# Patient Record
Sex: Female | Born: 1990 | Race: White | Hispanic: No | Smoking: Never smoker
Health system: Southern US, Community
[De-identification: ages and names within clinical notes are randomized; demographics above are authoritative.]

## PROBLEM LIST (undated history)

## (undated) DIAGNOSIS — H9325 Central auditory processing disorder: Secondary | ICD-10-CM

## (undated) DIAGNOSIS — R569 Unspecified convulsions: Secondary | ICD-10-CM

## (undated) DIAGNOSIS — K219 Gastro-esophageal reflux disease without esophagitis: Secondary | ICD-10-CM

## (undated) DIAGNOSIS — R112 Nausea with vomiting, unspecified: Secondary | ICD-10-CM

## (undated) DIAGNOSIS — F840 Autistic disorder: Secondary | ICD-10-CM

## (undated) DIAGNOSIS — J45909 Unspecified asthma, uncomplicated: Secondary | ICD-10-CM

## (undated) DIAGNOSIS — R7303 Prediabetes: Secondary | ICD-10-CM

## (undated) DIAGNOSIS — F909 Attention-deficit hyperactivity disorder, unspecified type: Secondary | ICD-10-CM

## (undated) DIAGNOSIS — T1490XA Injury, unspecified, initial encounter: Secondary | ICD-10-CM

## (undated) DIAGNOSIS — Z30017 Encounter for initial prescription of implantable subdermal contraceptive: Secondary | ICD-10-CM

## (undated) DIAGNOSIS — F4010 Social phobia, unspecified: Secondary | ICD-10-CM

## (undated) DIAGNOSIS — F319 Bipolar disorder, unspecified: Secondary | ICD-10-CM

## (undated) DIAGNOSIS — Z9889 Other specified postprocedural states: Secondary | ICD-10-CM

## (undated) HISTORY — DX: Bipolar disorder, unspecified: F31.9

## (undated) HISTORY — DX: Unspecified asthma, uncomplicated: J45.909

## (undated) HISTORY — DX: Encounter for initial prescription of implantable subdermal contraceptive: Z30.017

## (undated) HISTORY — DX: Injury, unspecified, initial encounter: T14.90XA

## (undated) HISTORY — DX: Social phobia, unspecified: F40.10

## (undated) HISTORY — DX: Central auditory processing disorder: H93.25

## (undated) HISTORY — DX: Attention-deficit hyperactivity disorder, unspecified type: F90.9

## (undated) HISTORY — DX: Autistic disorder: F84.0

## (undated) HISTORY — PX: WISDOM TOOTH EXTRACTION: SHX21

---
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
## 2011-02-21 NOTE — H&P (Signed)
Shannon Lin                          ACCOUNT NO.:  [id number]   MEDICAL RECORD NO.:  [id number]                   PATIENT TYPE:  INP   LOCATION:  0104                                 FACILITY:  [facility]   PHYSICIAN:  Carolanne Grumbling, M.D.                 DATE OF BIRTH:  May 01, 1991   DATE OF ADMISSION:  12/01/2003  DATE OF DISCHARGE:                         PSYCHIATRIC ADMISSION ASSESSMENT   CHIEF COMPLAINT:  Shannon Lin is a 20 year old female.  Shannon Lin was admitted  from her local hospital after her mother took her there after reporting she  was having suicidal thoughts with threats for jumping out of a car.   HISTORY LEADING UP TO THE PRESENT ILLNESS:  Shannon Lin says she was not  suicidal at the time.  She does not know why her mother said those things.  She admits to being depressed for some time and having made suicidal threats  in the past.  She says even then she is not really going to kill herself.  She just gets mad.  Consequently she sees no reason to be here.   FAMILY, SCHOOL AND SOCIAL ISSUES:  Shannon Lin apparently has been unhappy for  some time.  She wants to live with her father in [place].  She she loves  her mother but they do not always get along.  Her story was hard to follow  at times.  She seems to not be a good historian and does not seem to put  things together in always a logical fashion and consequently it seems she  was playing down her issues.  She says she has a stepfather who recently  separated from her mother.  She has an 1 year old sister who sounds like a  step or a half.  She has 5 stepbrothers and one full sister who is 19 who  currently lives with her and her mother.  She said school goes okay.  She  gets teased by her peers.  They call her a ho by the way she dresses.  She  does not think she dresses in any kind of a way to make them say those  things.  Consequently she does not like school.  She says her grades are  usually okay, with A's, B's, and  C's.  She says she passes every year.  She  says she is 12 and in the 6th grade because she started late but has not  failed any grades, she says.  She denied any history of sexual abuse or  physical abuse.   PREVIOUS PSYCHIATRIC TREATMENT:  She sees Dr. Mitzi Hansen and she started seeing  a counselor recently.   ALCOHOL, DRUG AND LEGAL ISSUES:  She denied any use or abuse.   MEDICAL PROBLEMS, ALLERGIES, AND MEDICATIONS:  She has no known allergies.  She says she has a seizure disorder.  She currently takes Concerta, Topamax,  Abilify, Strattera and Zoloft.   MENTAL STATUS  EXAM:  At the time of the initial evaluation revealed an  alert, oriented girl,  who came to the interview willingly and was  cooperative.  She was appropriately dressed and groomed.  She admitted to  depression with making suicidal threats but said she would never act on the  threats and she denied any current suicidal ideation or intent.  There was  no evidence of any thought disorder or other psychosis.  Short term and long  term memory were intact as measured by her ability to recall recent and  remote events in her own life.  Judgment currently seemed to be impaired by  her vague presentation and not seeming to be able to put information  together in a way that is helpful.  She does have a history of auditory  processing problems.  Otherwise intelligence seems to be within normal  limits.  Concentration was adequate for a one to one interview.   PATIENT ASSETS:  Shannon Lin is cooperative and says she wants help.   ADMISSION DIAGNOSIS:   AXIS I:  1. Mood disorder not otherwise specified.  2. Attention deficit hyperactivity disorder.   AXIS II:  Central auditory processing disorder by history.   AXIS III:  Seizure disorder by history.   AXIS IV:  Moderate.   AXIS V:  40/55   INITIAL PLAN OF CARE:  Estimated length of hospitalization is 5 to 7 days.  The plan is to stabilize to the point where she has no  suicidal ideation and  has a plan for dealing with her stresses more effectively.  Dr. Marlyne Beards  will be the attending.                                               Carolanne Grumbling, M.D.    GT/MEDQ  D:  12/02/2003  T:  12/03/2003  Job:  [id number]
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
## 2011-07-07 LAB — POCT PREGNANCY, URINE: Preg Test, Ur: NEGATIVE

## 2011-07-22 LAB — CBC
HCT: 38.8
Platelets: 303
RDW: 12.3
WBC: 12.9 — ABNORMAL HIGH

## 2011-07-22 LAB — URINALYSIS, ROUTINE W REFLEX MICROSCOPIC
Glucose, UA: NEGATIVE
Ketones, ur: 40 — AB
Nitrite: NEGATIVE
Protein, ur: NEGATIVE
pH: 6

## 2011-07-22 LAB — COMPREHENSIVE METABOLIC PANEL
ALT: 12
AST: 21
Albumin: 3.5
Alkaline Phosphatase: 72
BUN: 13
Chloride: 101
Potassium: 4.3
Sodium: 139
Total Bilirubin: 0.7
Total Protein: 6.8

## 2011-07-22 LAB — DIFFERENTIAL
Basophils Absolute: 0
Basophils Relative: 0
Eosinophils Absolute: 0
Eosinophils Relative: 0
Monocytes Absolute: 0.4
Monocytes Relative: 3

## 2011-07-22 LAB — PREGNANCY, URINE: Preg Test, Ur: NEGATIVE

## 2013-02-02 ENCOUNTER — Encounter: Payer: Self-pay | Admitting: *Deleted

## 2013-02-03 ENCOUNTER — Encounter: Payer: Self-pay | Admitting: Nurse Practitioner

## 2013-02-03 ENCOUNTER — Ambulatory Visit (INDEPENDENT_AMBULATORY_CARE_PROVIDER_SITE_OTHER): Payer: Medicaid Other | Admitting: Nurse Practitioner

## 2013-02-03 DIAGNOSIS — Z13220 Encounter for screening for lipoid disorders: Secondary | ICD-10-CM

## 2013-02-03 DIAGNOSIS — R5383 Other fatigue: Secondary | ICD-10-CM

## 2013-02-03 DIAGNOSIS — R5381 Other malaise: Secondary | ICD-10-CM

## 2013-02-04 ENCOUNTER — Encounter: Payer: Self-pay | Admitting: Nurse Practitioner

## 2013-02-04 ENCOUNTER — Other Ambulatory Visit: Payer: Self-pay | Admitting: *Deleted

## 2013-02-04 DIAGNOSIS — R5381 Other malaise: Secondary | ICD-10-CM

## 2013-02-04 LAB — HEPATIC FUNCTION PANEL
ALT: 14 U/L (ref 0–35)
Bilirubin, Direct: 0.1 mg/dL (ref 0.0–0.3)
Indirect Bilirubin: 0.4 mg/dL (ref 0.0–0.9)
Total Bilirubin: 0.5 mg/dL (ref 0.3–1.2)

## 2013-02-04 LAB — LIPID PANEL
Cholesterol: 188 mg/dL (ref 0–200)
HDL: 48 mg/dL (ref >39)
Triglycerides: 151 mg/dL — ABNORMAL HIGH (ref <150)
VLDL: 30 mg/dL (ref 0–40)

## 2013-02-04 LAB — BASIC METABOLIC PANEL
BUN: 9 mg/dL (ref 6–23)
Calcium: 9.9 mg/dL (ref 8.4–10.5)
Chloride: 100 mEq/L (ref 96–112)
Creat: 0.54 mg/dL (ref 0.50–1.10)

## 2013-02-04 NOTE — Progress Notes (Signed)
Subjective:  Presents with her mother to discuss her weight. Obvious friction between patient and her mother. Mother states she tries to buy healthy foods but is not there with patient all the time. Feels her boyfriend wants her to be overweight as a control issue. Patient denies this. Very limited activity. Some fatigue especially since she has gained weight. Does not drink a lot of sodas. Does drink some Kool-Aid but with minimal sugar. Has been on several mental health medications that can influence weight gain. No chest pain or shortness of breath. Has large breasts, wears double D. bra. Has a strong family history of heart disease at a young age.  Objective:   BP 148/80  Pulse 70  Ht 5' 0.25" (1.53 m)  Wt 194 lb (87.998 kg)  BMI 37.59 kg/m2  LMP 12/04/2012 NAD. Alert, oriented. Lungs clear. Heart regular rate rhythm. Abdomen obese soft nondistended nontender. Central obesity noted, waist circumference greater than 35 inches. Has gained 6 pounds since June 2013.  Assessment:Morbid obesity - Plan: Basic metabolic panel, Hepatic function panel, TSH, Insulin, Fasting, Basic metabolic panel, Hepatic function panel, TSH, Insulin, Fasting  Fatigue - Plan: Basic metabolic panel, Hepatic function panel, TSH, Insulin, Fasting, Basic metabolic panel, Hepatic function panel, TSH, Insulin, Fasting  Need for lipid screening - Plan: Lipid panel, Lipid panel  Plan: Lab work ordered. Refer patient to dietitian. Strongly emphasized that the patient will have to make a personal effort to lose weight. No weight loss medication prescribed at this time since patient has not shown any initiative until now to lose weight. Also concerned about possible interaction with patient's current medications. Patient has been told that she will need to show personal effort for any medication will even be considered.

## 2013-02-04 NOTE — Assessment & Plan Note (Signed)
Plan: Lab work ordered. Refer patient to dietitian. Strongly emphasized that the patient will have to make a personal effort to lose weight. No weight loss medication prescribed at this time since patient has not shown any initiative until now to lose weight. Also concerned about possible interaction with patient's current medications. Patient has been told that she will need to show personal effort for any medication will even be considered.

## 2013-02-09 ENCOUNTER — Telehealth: Payer: Self-pay | Admitting: Family Medicine

## 2013-02-09 NOTE — Telephone Encounter (Signed)
Call to inquiry on results for labs completed last Friday Feb 04, 2013.  Please call patient when results are completed.

## 2013-02-10 ENCOUNTER — Encounter: Payer: Self-pay | Admitting: Nurse Practitioner

## 2013-04-22 NOTE — Progress Notes (Signed)
Test was not run

## 2013-04-22 NOTE — Progress Notes (Signed)
Test was not performed

## 2013-05-17 ENCOUNTER — Encounter: Payer: Medicaid Other | Admitting: Nurse Practitioner

## 2013-09-07 ENCOUNTER — Telehealth: Payer: Self-pay | Admitting: *Deleted

## 2013-09-07 NOTE — Telephone Encounter (Signed)
Please verify dose with the pharmacy. May have a 30 day prescription of the Abilify.

## 2013-09-07 NOTE — Telephone Encounter (Signed)
Mom is requesting a refill for Abilify 20 mg.. She normally get it from [facility] office Dr. Margarita Rana.. Her doctor is currently out of the country. Mom would like to know if she can get a prescription to last her over for a couple week until her doctor gets back   pharmacy: [facility]  Mom # [phone]

## 2013-09-08 MED ORDER — ARIPIPRAZOLE 15 MG PO TABS: 15.0000 mg | ORAL_TABLET | Freq: Every day | ORAL | Status: DC

## 2013-09-08 NOTE — Telephone Encounter (Signed)
I called [facility]. They stated pt was taking abilify 15mg  one every day. rx sent to [facility] [place] for a 30 day supply. [facility] to notify patient.

## 2013-09-08 NOTE — Telephone Encounter (Signed)
Discussed with patient. Med sent to pharm.  

## 2013-09-23 ENCOUNTER — Other Ambulatory Visit: Payer: Self-pay | Admitting: Family Medicine

## 2013-09-30 ENCOUNTER — Encounter: Payer: Self-pay | Admitting: Family Medicine

## 2013-09-30 ENCOUNTER — Ambulatory Visit (INDEPENDENT_AMBULATORY_CARE_PROVIDER_SITE_OTHER): Payer: Medicaid Other | Admitting: Family Medicine

## 2013-09-30 VITALS — BP 130/70 | Temp 98.6°F | Ht 61.0 in | Wt 193.1 lb

## 2013-09-30 DIAGNOSIS — B353 Tinea pedis: Secondary | ICD-10-CM

## 2013-09-30 DIAGNOSIS — R739 Hyperglycemia, unspecified: Secondary | ICD-10-CM

## 2013-09-30 DIAGNOSIS — Z131 Encounter for screening for diabetes mellitus: Secondary | ICD-10-CM

## 2013-09-30 DIAGNOSIS — J069 Acute upper respiratory infection, unspecified: Secondary | ICD-10-CM

## 2013-09-30 DIAGNOSIS — R638 Other symptoms and signs concerning food and fluid intake: Secondary | ICD-10-CM

## 2013-09-30 DIAGNOSIS — R7309 Other abnormal glucose: Secondary | ICD-10-CM

## 2013-09-30 DIAGNOSIS — R635 Abnormal weight gain: Secondary | ICD-10-CM

## 2013-09-30 LAB — POCT GLYCOSYLATED HEMOGLOBIN (HGB A1C): Hemoglobin A1C: 5.1

## 2013-09-30 MED ORDER — KETOCONAZOLE 2 % EX CREA: 1.0000 "application " | TOPICAL_CREAM | Freq: Two times a day (BID) | CUTANEOUS | Status: DC
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
## 2013-10-04 ENCOUNTER — Other Ambulatory Visit: Payer: Self-pay | Admitting: Family Medicine

## 2013-10-04 ENCOUNTER — Telehealth: Payer: Self-pay | Admitting: Family Medicine

## 2013-10-04 DIAGNOSIS — R21 Rash and other nonspecific skin eruption: Secondary | ICD-10-CM

## 2013-10-04 NOTE — Telephone Encounter (Signed)
For this situation I rec referral to Dermatology, I put it into system, Enid Derry will handle please inform familty

## 2013-10-04 NOTE — Telephone Encounter (Signed)
Patient was seen on 12/26 with spots on bottom of her foot now has spread into two spots. Now she wants a referral to doctor bradford or dermalogist as soon as possible.

## 2013-10-04 NOTE — Telephone Encounter (Addendum)
Family notified

## 2013-10-09 ENCOUNTER — Other Ambulatory Visit: Payer: Self-pay | Admitting: Nurse Practitioner

## 2013-10-10 ENCOUNTER — Telehealth: Payer: Self-pay | Admitting: Family Medicine

## 2013-10-10 MED ORDER — NORGESTIM-ETH ESTRAD TRIPHASIC 0.18/0.215/0.25 MG-25 MCG PO TABS: 1.0000 | ORAL_TABLET | Freq: Every day | ORAL | Status: DC

## 2013-10-10 NOTE — Telephone Encounter (Signed)
Medication sent in to pharmacy. Mom was notified.

## 2013-10-10 NOTE — Telephone Encounter (Signed)
Refill times 6 

## 2013-10-10 NOTE — Telephone Encounter (Signed)
Patient will be out of ortho tri-cyclen today and needs a refill ASAP    [facility]

## 2013-10-31 ENCOUNTER — Encounter: Payer: Self-pay | Admitting: Family Medicine

## 2013-11-26 ENCOUNTER — Other Ambulatory Visit: Payer: Self-pay | Admitting: Family Medicine

## 2014-01-07 ENCOUNTER — Other Ambulatory Visit: Payer: Self-pay | Admitting: Family Medicine

## 2014-05-07 ENCOUNTER — Other Ambulatory Visit: Payer: Self-pay | Admitting: Family Medicine

## 2014-05-19 ENCOUNTER — Encounter: Payer: Self-pay | Admitting: Nurse Practitioner

## 2014-05-19 ENCOUNTER — Ambulatory Visit (INDEPENDENT_AMBULATORY_CARE_PROVIDER_SITE_OTHER): Payer: Medicaid Other | Admitting: Nurse Practitioner

## 2014-05-19 VITALS — BP 128/70 | Ht 61.0 in | Wt 197.5 lb

## 2014-05-19 DIAGNOSIS — B353 Tinea pedis: Secondary | ICD-10-CM

## 2014-05-19 DIAGNOSIS — K921 Melena: Secondary | ICD-10-CM

## 2014-05-19 DIAGNOSIS — L301 Dyshidrosis [pompholyx]: Secondary | ICD-10-CM

## 2014-05-19 MED ORDER — CLOBETASOL PROPIONATE 0.05 % EX CREA: 1.0000 "application " | TOPICAL_CREAM | Freq: Two times a day (BID) | CUTANEOUS | Status: DC

## 2014-05-19 MED ORDER — KETOCONAZOLE 2 % EX CREA: 1.0000 "application " | TOPICAL_CREAM | Freq: Two times a day (BID) | CUTANEOUS | Status: DC

## 2014-05-19 NOTE — Patient Instructions (Signed)
Phentermine for weight loss.

## 2014-05-24 ENCOUNTER — Telehealth: Payer: Self-pay | Admitting: Family Medicine

## 2014-05-24 NOTE — Telephone Encounter (Signed)
Mom checked with pyschology, weight loss meds that were recommended will be fine to take with her other meds, please call in to [facility] unsure of name of medicine, pt saw [name] 05/19/14 Please call mom when done

## 2014-05-25 ENCOUNTER — Other Ambulatory Visit: Payer: Self-pay | Admitting: Nurse Practitioner

## 2014-05-25 ENCOUNTER — Encounter: Payer: Self-pay | Admitting: Nurse Practitioner

## 2014-05-25 MED ORDER — PHENTERMINE HCL 37.5 MG PO TABS: ORAL_TABLET | ORAL | Status: DC

## 2014-05-25 NOTE — Progress Notes (Signed)
[place] 05/25/14

## 2014-05-25 NOTE — Progress Notes (Signed)
Subjective:  Presents for several issues. Overall eats a healthy diet. Limited activity. Inquiring about weight loss medication. Tends to have a BM right after eating usually less than 30 minutes mostly with greasy or fried foods. No nausea vomiting. No abdominal pain. Had a hard BM this morning followed by a small amount of bright red blood, none since. This is the first time this has happened. No rectal pain or itching. Also rash on her right foot that is been persistent since last visit see previous note. Starts as small clear fluid-filled pruritic rash.  Objective:   BP 128/70  Ht 5\' 1"  (1.549 m)  Wt 197 lb 8 oz (89.585 kg)  BMI 37.34 kg/m2 NAD. Alert, oriented. Lungs clear. Heart regular rate rhythm. Abdomen soft nondistended obese and nontender. Confluent patch of dry skin with mild shiny erythema and some papules with excoriation noted on the right foot.  Assessment: Hematochezia secondary to constipation  Tinea pedis of right foot  Dyshidrotic eczema  Morbid obesity Plan:  Meds ordered this encounter  Medications  . GuanFACINE HCl (INTUNIV) 3 MG TB24    Sig: Take by mouth daily.  Marland Kitchen ketoconazole (NIZORAL) 2 % cream    Sig: Apply 1 application topically 2 (two) times daily.    Dispense:  30 g    Refill:  4    Order Specific Question:  Supervising Provider    Answer:  Mikey Kirschner [[id number]]  . clobetasol cream (TEMOVATE) 0.05 %    Sig: Apply 1 application topically 2 (two) times daily.    Dispense:  30 g    Refill:  0    Order Specific Question:  Supervising Provider    Answer:  Mikey Kirschner [[id number]]   Apply both creams together. Call back in 2 weeks if persists. Remove socks whenever possible to allow air to get to rash. Recommend increased fiber in her diet to avoid constipation. Do not recommend weight loss medicine without consultation with her psychiatrist. Recheck if any symptoms worsen or persist. Also strongly recommend preventive health physical.

## 2014-05-26 NOTE — Progress Notes (Signed)
Discussed with pt. rx faxed to [facility]

## 2014-06-27 ENCOUNTER — Other Ambulatory Visit: Payer: Self-pay | Admitting: Nurse Practitioner

## 2014-07-03 ENCOUNTER — Telehealth: Payer: Self-pay | Admitting: Family Medicine

## 2014-07-03 NOTE — Telephone Encounter (Signed)
Left message on voicemail notifying patient that she needs a follow up visit first before further refills.

## 2014-07-03 NOTE — Telephone Encounter (Signed)
Patient is out of her phentermine (ADIPEX-P) 37.5 MG tablet, but the pharmacy said that we denied refill request. Please advise on why.    [facility]

## 2014-07-10 ENCOUNTER — Ambulatory Visit (INDEPENDENT_AMBULATORY_CARE_PROVIDER_SITE_OTHER): Payer: Medicaid Other | Admitting: Nurse Practitioner

## 2014-07-10 ENCOUNTER — Encounter: Payer: Self-pay | Admitting: Nurse Practitioner

## 2014-07-10 DIAGNOSIS — Z3009 Encounter for other general counseling and advice on contraception: Secondary | ICD-10-CM

## 2014-07-10 MED ORDER — PHENTERMINE HCL 37.5 MG PO TABS: ORAL_TABLET | ORAL | Status: DC

## 2014-07-10 MED ORDER — LEVONORGEST-ETH ESTRAD 91-DAY 0.15-0.03 MG PO TABS: ORAL_TABLET | ORAL | Status: DC

## 2014-07-14 ENCOUNTER — Encounter: Payer: Self-pay | Admitting: Nurse Practitioner

## 2014-07-14 NOTE — Progress Notes (Signed)
Subjective:  Presents with her mother for a recheck. Doing much better with her diet. Regular exercise, using an elliptical. Regular menstrual cycles, normal flow. Denies any missed pills. Denies any side effects from phentermine.  Objective:   BP 122/80  Ht 5\' 1"  (1.549 m)  Wt 195 lb (88.451 kg)  BMI 36.86 kg/m2 NAD. Alert, oriented. Cheerful affect. Lungs clear. Heart regular rate rhythm.  Assessment: Morbid obesity  Encounter for other general counseling or advice on contraception  Plan:  Meds ordered this encounter  Medications  . levonorgestrel-ethinyl estradiol (QUASENSE) 0.15-0.03 MG tablet    Sig: TAKE 1 TABLET BY MOUTH EVERY DAY AS DIRECTED    Dispense:  91 tablet    Refill:  3    Order Specific Question:  Supervising Provider    Answer:  Mikey Kirschner [[id number]]  . phentermine (ADIPEX-P) 37.5 MG tablet    Sig: one tab po qd; take in AM on empty stomach    Dispense:  30 tablet    Refill:  2    Order Specific Question:  Supervising Provider    Answer:  Mikey Kirschner [[id number]]   Encouraged continued activity and healthy diet. Continue phentermine for 3 months, call if any problems. Encourage preventive health physical. Encouraged flu vaccine, cannot be given at our office. Return in about 3 months (around 10/10/2014).

## 2014-08-03 ENCOUNTER — Encounter: Payer: Self-pay | Admitting: Nurse Practitioner

## 2014-08-03 ENCOUNTER — Ambulatory Visit (INDEPENDENT_AMBULATORY_CARE_PROVIDER_SITE_OTHER): Payer: Medicaid Other | Admitting: Nurse Practitioner

## 2014-08-03 VITALS — BP 138/90 | Temp 98.3°F | Ht 61.0 in | Wt 193.0 lb

## 2014-08-03 DIAGNOSIS — M545 Low back pain, unspecified: Secondary | ICD-10-CM

## 2014-08-03 LAB — POCT UA - MICROSCOPIC ONLY
Bacteria, U Microscopic: NEGATIVE
RBC, urine, microscopic: NEGATIVE
WBC, UR, HPF, POC: NEGATIVE

## 2014-08-03 LAB — POCT URINALYSIS DIPSTICK
PH UA: 8
SPEC GRAV UA: 1.02

## 2014-08-03 MED ORDER — NAPROXEN 375 MG PO TABS: 375.0000 mg | ORAL_TABLET | Freq: Two times a day (BID) | ORAL | Status: DC

## 2014-08-03 NOTE — Patient Instructions (Signed)

## 2014-08-07 NOTE — Progress Notes (Signed)
Subjective:  Present with her mother for complaints of right low back pain that began yesterday morning. Radiates from the right low back area into the hip and lateral leg. Was sleeping on a hard mattress while visiting a relative. No specific history of injury. No fever. No urinary symptoms. No cough.  Objective:   BP 138/90 mmHg  Temp(Src) 98.3 F (36.8 C)  Ht 5\' 1"  (1.549 m)  Wt 193 lb (87.544 kg)  BMI 36.49 kg/m2 NAD. Alert, oriented. Lungs clear. Heart regular rate rhythm. No CVA tenderness. Tenderness with palpation of the right low back/lumbar area into the buttock. SLR negative bilateral. Reflexes normal limit lower extremities. Gait normal limit. Results for orders placed or performed in visit on 08/03/14  POCT urinalysis dipstick  Result Value Ref Range   Color, UA     Clarity, UA     Glucose, UA     Bilirubin, UA     Ketones, UA     Spec Grav, UA 1.020    Blood, UA     pH, UA 8.0    Protein, UA     Urobilinogen, UA     Nitrite, UA     Leukocytes, UA small (1+)   POCT UA - Microscopic Only  Result Value Ref Range   WBC, Ur, HPF, POC neg    RBC, urine, microscopic neg    Bacteria, U Microscopic neg    Mucus, UA     Epithelial cells, urine per micros multiple    Crystals, Ur, HPF, POC     Casts, Ur, LPF, POC     Yeast, UA       Assessment: Right-sided low back pain without sciatica - Plan: POCT urinalysis dipstick, POCT UA - Microscopic Only  Plan:  Meds ordered this encounter  Medications  . naproxen (NAPROSYN) 375 MG tablet    Sig: Take 1 tablet (375 mg total) by mouth 2 (two) times daily with a meal.    Dispense:  30 tablet    Refill:  0    Order Specific Question:  Supervising Provider    Answer:  Mikey Kirschner [[id number]]  Back exercises. Stretching. Ice/heat applications. TENS unit as directed. Callback in 7-10 days if no improvement, sooner if worse. Encouraged continued weight loss.

## 2014-09-11 ENCOUNTER — Telehealth: Payer: Self-pay | Admitting: Family Medicine

## 2014-09-11 NOTE — Telephone Encounter (Signed)
error 

## 2014-10-09 ENCOUNTER — Ambulatory Visit (INDEPENDENT_AMBULATORY_CARE_PROVIDER_SITE_OTHER): Payer: Medicaid Other | Admitting: Nurse Practitioner

## 2014-10-09 ENCOUNTER — Encounter: Payer: Self-pay | Admitting: Nurse Practitioner

## 2014-10-09 MED ORDER — PHENTERMINE HCL 37.5 MG PO TABS: ORAL_TABLET | ORAL | Status: DC

## 2014-10-09 NOTE — Patient Instructions (Signed)
Goals:  #1: walk 1/4 mile 3 days minimum #2: limit sugars including regular soda, juice and sweet tea #3: try not to eat within 3 hours of going to bed

## 2014-10-09 NOTE — Progress Notes (Signed)
Subjective:  Presents for recheck. Relative is present today. Her mother is working. No side effects from phentermine. Sleeping well. No regular exercise. Has increased her water intake. Question how much soda she drinks per day.  Objective:   BP 124/76 mmHg  Ht 5\' 2"  (1.575 m)  Wt 188 lb (85.276 kg)  BMI 34.38 kg/m2 NAD. Alert, oriented. Lungs clear. Heart RRR.Marland Kitchen  Assessment: Morbid obesity  Plan:  Meds ordered this encounter  Medications  . phentermine (ADIPEX-P) 37.5 MG tablet    Sig: one tab po qd; take in AM on empty stomach    Dispense:  30 tablet    Refill:  1    Order Specific Question:  Supervising Provider    Answer:  Maggie Font   Needs to stop medicine after this Rx. Her goals she agreed on are: #1: walk 1/4 mile 3 days minimum #2: limit sugars including regular soda, juice and sweet tea #3: try not to eat within 3 hours of going to bed

## 2015-04-26 DIAGNOSIS — Z029 Encounter for administrative examinations, unspecified: Secondary | ICD-10-CM

## 2015-08-01 ENCOUNTER — Other Ambulatory Visit: Payer: Self-pay | Admitting: Nurse Practitioner

## 2015-09-04 ENCOUNTER — Ambulatory Visit (INDEPENDENT_AMBULATORY_CARE_PROVIDER_SITE_OTHER): Payer: Medicaid Other | Admitting: Family Medicine

## 2015-09-04 VITALS — Temp 98.3°F | Ht 62.0 in | Wt 197.4 lb

## 2015-09-04 DIAGNOSIS — B001 Herpesviral vesicular dermatitis: Secondary | ICD-10-CM | POA: Diagnosis not present

## 2015-09-04 DIAGNOSIS — J0100 Acute maxillary sinusitis, unspecified: Secondary | ICD-10-CM

## 2015-09-04 MED ORDER — CEFPROZIL 500 MG PO TABS: 500.0000 mg | ORAL_TABLET | Freq: Two times a day (BID) | ORAL | Status: DC

## 2015-09-04 MED ORDER — VALACYCLOVIR HCL 1 G PO TABS: ORAL_TABLET | ORAL | Status: DC

## 2015-09-04 NOTE — Progress Notes (Signed)
   Subjective:    Patient ID: Shannon Lin, female    DOB: 07-09-1991, 23 y.o.   MRN: [id number]  Otalgia  There is pain in the left ear. This is a new problem. The current episode started in the past 7 days. Associated symptoms include coughing, rhinorrhea and a sore throat.   Complains of pain to the left ear pain down left side of the face underneath the jaw complains of head congestion drainage coughing   Review of Systems  HENT: Positive for ear pain, rhinorrhea and sore throat.   Respiratory: Positive for cough.        Objective:   Physical Exam Eardrums normal throat is normal neck no masses lungs clear heart regular there is some tenderness in left side of the neck with cervical lymphadenopathy   Patient needs refill on fever blister medicine    Assessment & Plan:  Rhinosinusitis along with lymphadenopathy antibiotics prescribed warning signs discussed follow-up if problems

## 2015-11-10 ENCOUNTER — Other Ambulatory Visit: Payer: Self-pay | Admitting: Nurse Practitioner

## 2016-02-05 ENCOUNTER — Ambulatory Visit (INDEPENDENT_AMBULATORY_CARE_PROVIDER_SITE_OTHER): Payer: Medicaid Other | Admitting: Family Medicine

## 2016-02-05 ENCOUNTER — Encounter: Payer: Self-pay | Admitting: Family Medicine

## 2016-02-05 VITALS — BP 128/80 | Temp 98.4°F | Ht 62.0 in | Wt 197.1 lb

## 2016-02-05 DIAGNOSIS — R1032 Left lower quadrant pain: Secondary | ICD-10-CM | POA: Diagnosis not present

## 2016-02-05 DIAGNOSIS — K591 Functional diarrhea: Secondary | ICD-10-CM | POA: Diagnosis not present

## 2016-02-05 DIAGNOSIS — K625 Hemorrhage of anus and rectum: Secondary | ICD-10-CM | POA: Diagnosis not present

## 2016-02-05 MED ORDER — PHENTERMINE HCL 37.5 MG PO TABS: ORAL_TABLET | ORAL | Status: DC

## 2016-02-05 NOTE — Progress Notes (Signed)
   Subjective:    Patient ID: Shannon Lin, female    DOB: 1991-02-01, 25 y.o.   MRN: [id number]  Rectal Bleeding  The current episode started Lin than 1 week ago. The problem occurs occasionally. The problem has been unchanged. The pain is moderate. There was no prior successful therapy. There was no prior unsuccessful therapy. She has been behaving normally. She has been eating and drinking normally.   Patient states that she has no other concerns at this time.  The symptoms been going on for over 6 months of frequently stools often Lin so in the morning but to some degree later in the day as well. Denies projectile vomiting. Denies any chest tightness pressure pain. Does relate intermittent lower abdominal discomfort and also relates intermittent blood mixed in with stool stool rarely formed. Does not find any change with what she eats or drinks and denies any night sweats   Review of Systems  Gastrointestinal: Positive for hematochezia.  Patient relates some intermittent lower abdominal pain denies flank pain. Denies fever chills sweats. Denies weight loss.     Objective:   Physical Exam Lungs are clear no crackles heart regular moderate obesity abdomen is soft subjected discomfort in the lower abdomen region mid and left side. No guarding or rebound. Rectal exam no hemorrhoids noted. Hemoccult negative.       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 I believe this patient will need gastroenterology consultation but first will do lab work in await the results of this. We'll also be doing stool testing as well.  Significant obesity the importance of healthy diet exercise discuss. We will go ahead and renew her Adipex for 2 months. She states this did well for her in the past the importance of healthy diet and exercise discussed in detail

## 2016-02-08 LAB — CBC WITH DIFFERENTIAL/PLATELET
BASOS: 0 %
Basophils Absolute: 0 10*3/uL (ref 0.0–0.2)
EOS (ABSOLUTE): 0 10*3/uL (ref 0.0–0.4)
EOS: 0 %
HEMATOCRIT: 36.1 % (ref 34.0–46.6)
Hemoglobin: 12.1 g/dL (ref 11.1–15.9)
IMMATURE GRANS (ABS): 0 10*3/uL (ref 0.0–0.1)
IMMATURE GRANULOCYTES: 0 %
Lymphocytes Absolute: 2.2 10*3/uL (ref 0.7–3.1)
Lymphs: 25 %
MCH: 30.3 pg (ref 26.6–33.0)
MCHC: 33.5 g/dL (ref 31.5–35.7)
MCV: 90 fL (ref 79–97)
MONOS ABS: 0.4 10*3/uL (ref 0.1–0.9)
Monocytes: 5 %
Neutrophils Absolute: 6.2 10*3/uL (ref 1.4–7.0)
Neutrophils: 70 %
PLATELETS: 297 10*3/uL (ref 150–379)
RBC: 4 x10E6/uL (ref 3.77–5.28)
RDW: 14.4 % (ref 12.3–15.4)
WBC: 8.9 10*3/uL (ref 3.4–10.8)

## 2016-02-08 LAB — BASIC METABOLIC PANEL
BUN/Creatinine Ratio: 16 (ref 9–23)
BUN: 11 mg/dL (ref 6–20)
CALCIUM: 9.3 mg/dL (ref 8.7–10.2)
CHLORIDE: 97 mmol/L (ref 96–106)
CO2: 27 mmol/L (ref 18–29)
Creatinine, Ser: 0.67 mg/dL (ref 0.57–1.00)
GFR, EST AFRICAN AMERICAN: 141 mL/min/{1.73_m2} (ref >59)
GFR, EST NON AFRICAN AMERICAN: 123 mL/min/{1.73_m2} (ref >59)
Glucose: 116 mg/dL — ABNORMAL HIGH (ref 65–99)
POTASSIUM: 4.9 mmol/L (ref 3.5–5.2)
Sodium: 141 mmol/L (ref 134–144)

## 2016-02-08 LAB — TISSUE TRANSGLUTAMINASE, IGA: Transglutaminase IgA: <2 U/mL (ref 0–3)

## 2016-02-13 LAB — CLOSTRIDIUM DIFFICILE BY PCR: CDIFFPCR: NEGATIVE

## 2016-02-16 LAB — STOOL CULTURE: E coli, Shiga toxin Assay: NEGATIVE

## 2016-05-16 ENCOUNTER — Other Ambulatory Visit: Payer: Self-pay | Admitting: Family Medicine

## 2016-08-24 ENCOUNTER — Other Ambulatory Visit: Payer: Self-pay | Admitting: Family Medicine

## 2016-08-25 ENCOUNTER — Other Ambulatory Visit: Payer: Self-pay | Admitting: Nurse Practitioner

## 2016-08-25 ENCOUNTER — Telehealth: Payer: Self-pay | Admitting: Nurse Practitioner

## 2016-08-25 MED ORDER — LEVONORGEST-ETH ESTRAD 91-DAY 0.15-0.03 MG PO TABS: 1.0000 | ORAL_TABLET | Freq: Every day | ORAL | 0 refills | Status: DC

## 2016-08-25 NOTE — Telephone Encounter (Signed)
done

## 2016-08-25 NOTE — Telephone Encounter (Signed)
Pt is needing a refill on her birth control. Pt has an appt scheduled for 09/18/16.    Festus Barren

## 2016-09-18 ENCOUNTER — Ambulatory Visit (INDEPENDENT_AMBULATORY_CARE_PROVIDER_SITE_OTHER): Payer: Medicaid Other | Admitting: Nurse Practitioner

## 2016-09-18 VITALS — BP 112/68 | Ht 62.0 in | Wt 196.4 lb

## 2016-09-18 DIAGNOSIS — Z3041 Encounter for surveillance of contraceptive pills: Secondary | ICD-10-CM

## 2016-09-18 DIAGNOSIS — R739 Hyperglycemia, unspecified: Secondary | ICD-10-CM

## 2016-09-18 DIAGNOSIS — Z13220 Encounter for screening for lipoid disorders: Secondary | ICD-10-CM

## 2016-09-18 DIAGNOSIS — R5383 Other fatigue: Secondary | ICD-10-CM | POA: Diagnosis not present

## 2016-09-18 DIAGNOSIS — N62 Hypertrophy of breast: Secondary | ICD-10-CM | POA: Diagnosis not present

## 2016-09-18 MED ORDER — LEVONORGEST-ETH ESTRAD 91-DAY 0.15-0.03 MG PO TABS: 1.0000 | ORAL_TABLET | Freq: Every day | ORAL | 0 refills | Status: DC

## 2016-09-18 MED ORDER — NYSTATIN 100000 UNIT/GM EX CREA: 1.0000 "application " | TOPICAL_CREAM | Freq: Two times a day (BID) | CUTANEOUS | 0 refills | Status: DC

## 2016-09-19 ENCOUNTER — Encounter: Payer: Self-pay | Admitting: Nurse Practitioner

## 2016-09-19 DIAGNOSIS — N62 Hypertrophy of breast: Secondary | ICD-10-CM | POA: Insufficient documentation

## 2016-09-19 NOTE — Progress Notes (Signed)
Subjective:  Presents for recheck on ocs. Regular cycles with normal flow. Denies sexual activity. Mother present per her request. Very limited activity. Gained a large amount of weight while on Risperdal for several years. No longer on med. Has very large breasts causing chronic neck and upper back pain. Also recurrent rash under the breasts, not a problem today. Some fatigue. According to her mother, she wears at least a DDD bra.   Objective:   BP 112/68   Ht 5\' 2"  (1.575 m)   Wt 196 lb 6.4 oz (89.1 kg)   BMI 35.92 kg/m  NAD. Alert, oriented. Lungs clear. Heart RRR. Very tight tender muscles in upper back and neck area.   Assessment:  Problem List Items Addressed This Visit      Other   Large breasts   Morbid obesity (Fred[place])   Relevant Orders   Amb Referral to Nutrition and Diabetic E    Other Visit Diagnoses    Other fatigue    -  Primary   Relevant Orders   CBC with Differential   Basic Metabolic Panel (BMET)   Hepatic function panel   Hyperglycemia       Relevant Orders   HgB A1c   Insulin, random   Screening for cholesterol level       Relevant Orders   Lipid Profile   Encounter for surveillance of contraceptive pills           Plan:  Meds ordered this encounter  Medications  . levonorgestrel-ethinyl estradiol (QUASENSE) 0.15-0.03 MG tablet    Sig: Take 1 tablet by mouth daily.    Dispense:  91 tablet    Refill:  0    Order Specific Question:   Supervising Provider    Answer:   Mikey Kirschner [[id number]]  . nystatin cream (MYCOSTATIN)    Sig: Apply 1 application topically 2 (two) times daily.    Dispense:  30 g    Refill:  0    Order Specific Question:   Supervising Provider    Answer:   Maggie Font   Refer to dietician. Encouraged regular walking program with goal of 30 minutes per day most days of the week.  Recommend preventive health physical.

## 2016-09-23 ENCOUNTER — Encounter: Payer: Self-pay | Admitting: Family Medicine

## 2016-10-07 ENCOUNTER — Ambulatory Visit: Payer: Self-pay | Admitting: Nutrition

## 2016-10-13 ENCOUNTER — Other Ambulatory Visit: Payer: Self-pay | Admitting: Nurse Practitioner

## 2016-10-13 LAB — CBC WITH DIFFERENTIAL/PLATELET
BASOS ABS: 0 {cells}/uL (ref 0–200)
Basophils Relative: 0 %
EOS ABS: 51 {cells}/uL (ref 15–500)
Eosinophils Relative: 1 %
HEMATOCRIT: 39.1 % (ref 35.0–45.0)
Hemoglobin: 12.7 g/dL (ref 11.7–15.5)
LYMPHS PCT: 29 %
Lymphs Abs: 1479 cells/uL (ref 850–3900)
MCH: 30.2 pg (ref 27.0–33.0)
MCHC: 32.5 g/dL (ref 32.0–36.0)
MCV: 92.9 fL (ref 80.0–100.0)
MONO ABS: 255 {cells}/uL (ref 200–950)
MONOS PCT: 5 %
MPV: 8.3 fL (ref 7.5–12.5)
NEUTROS PCT: 65 %
Neutro Abs: 3315 cells/uL (ref 1500–7800)
Platelets: 302 10*3/uL (ref 140–400)
RBC: 4.21 MIL/uL (ref 3.80–5.10)
RDW: 13.7 % (ref 11.0–15.0)
WBC: 5.1 10*3/uL (ref 3.8–10.8)

## 2016-10-13 LAB — HEMOGLOBIN A1C
Hgb A1c MFr Bld: 5.5 % (ref <5.7)
Mean Plasma Glucose: 111 mg/dL

## 2016-10-13 LAB — BASIC METABOLIC PANEL
BUN: 11 mg/dL (ref 7–25)
CALCIUM: 9.1 mg/dL (ref 8.6–10.2)
CHLORIDE: 100 mmol/L (ref 98–110)
CO2: 27 mmol/L (ref 20–31)
CREATININE: 0.57 mg/dL (ref 0.50–1.10)
Glucose, Bld: 136 mg/dL — ABNORMAL HIGH (ref 65–99)
Potassium: 4.5 mmol/L (ref 3.5–5.3)
Sodium: 137 mmol/L (ref 135–146)

## 2016-10-13 LAB — HEPATIC FUNCTION PANEL
ALK PHOS: 52 U/L (ref 33–115)
ALT: 7 U/L (ref 6–29)
AST: 10 U/L (ref 10–30)
Albumin: 3.9 g/dL (ref 3.6–5.1)
BILIRUBIN INDIRECT: 0.3 mg/dL (ref 0.2–1.2)
Bilirubin, Direct: 0 mg/dL (ref <=0.2)
TOTAL PROTEIN: 6.6 g/dL (ref 6.1–8.1)
Total Bilirubin: 0.3 mg/dL (ref 0.2–1.2)

## 2016-10-13 LAB — LIPID PANEL
Cholesterol: 163 mg/dL (ref <200)
HDL: 38 mg/dL — ABNORMAL LOW (ref >50)
LDL CALC: 93 mg/dL (ref <100)
TRIGLYCERIDES: 161 mg/dL — AB (ref <150)
Total CHOL/HDL Ratio: 4.3 Ratio (ref <5.0)
VLDL: 32 mg/dL — ABNORMAL HIGH (ref <30)

## 2016-10-14 ENCOUNTER — Encounter: Payer: Self-pay | Admitting: Nurse Practitioner

## 2016-10-14 DIAGNOSIS — E161 Other hypoglycemia: Secondary | ICD-10-CM | POA: Insufficient documentation

## 2016-10-14 LAB — INSULIN, RANDOM: INSULIN: 55.4 u[IU]/mL — AB (ref 2.0–19.6)

## 2016-10-16 ENCOUNTER — Telehealth: Payer: Self-pay | Admitting: Nutrition

## 2016-10-16 ENCOUNTER — Encounter: Payer: Medicaid Other | Attending: Family Medicine | Admitting: Nutrition

## 2016-10-16 ENCOUNTER — Other Ambulatory Visit: Payer: Self-pay | Admitting: Nurse Practitioner

## 2016-10-16 ENCOUNTER — Telehealth: Payer: Self-pay | Admitting: Pediatrics

## 2016-10-16 DIAGNOSIS — Z713 Dietary counseling and surveillance: Secondary | ICD-10-CM | POA: Diagnosis not present

## 2016-10-16 DIAGNOSIS — R7301 Impaired fasting glucose: Secondary | ICD-10-CM

## 2016-10-16 DIAGNOSIS — E161 Other hypoglycemia: Secondary | ICD-10-CM

## 2016-10-16 MED ORDER — METFORMIN HCL 500 MG PO TABS: ORAL_TABLET | ORAL | 2 refills | Status: DC

## 2016-10-16 NOTE — Patient Instructions (Signed)
Goals 1. Follow Plate Method 2. Don't  skip meals 3. Increase fresh fruits and vegetables. 4. Drink water. 5. Walk 30 minutes daily. Lose 1 lb per weight

## 2016-10-16 NOTE — Telephone Encounter (Signed)
Pt had appt with dietician this am.  She states when reviewing notes, pt to be started on Metformin.  There does not appear to be order for it, not in med list. Please advise.

## 2016-10-16 NOTE — Progress Notes (Signed)
  Medical Nutrition Therapy:  Appt start time: 0800 end time:  0900.  Assessment:  Primary concerns today: Obesity and prediabetes. She is here with her boyfriend. LIves with her mom. Prediabetic with elevated insulin levels of 55.4. Wants to lose weight and prevent diabetes. Had a sister who died of HA with type 1 DM at 60. .  Eats 2 meals per day.  Snacks varies. Her mom does the cooking in the home. Foods are baked, broiled and fried. Eat out fast foods often.     Currently not working.  Watches TV during the day.  Skips breakfast and lunch often daily.   DIet is high in fat, sodium and sugar and processed foods contributing to her obesity and prediabetes state. She appears motivated to make changes but will need support from boyfriend and her mom. Needs more fresh fruits and vegetables and whole grains and fresh meats.   She was on Phentermine but will stop taking it   Needs to exercise daily.  She states that she will be starting medication soon. ? No order for Metformin or other meds listed for prediabetes. Will check with provider.  Wt Readings from Last 3 Encounters:  10/16/16 199 lb (90.3 kg)  09/18/16 196 lb 6.4 oz (89.1 kg)  02/05/16 197 lb 2 oz (89.4 kg)   Ht Readings from Last 3 Encounters:  10/16/16 5\' 1"  (1.549 m)  09/18/16 5\' 2"  (1.575 m)  02/05/16 5\' 2"  (1.575 m)   Body mass index is 37.6 kg/m.  Preferred Learning Style:  No preference indicated   Learning Readiness:  Change in progress   MEDICATIONS: See list   DIETARY INTAKE:   24-hr recall:  B ( AM): Nothing this am. : Raisin bran cereal 2%milk or toaster strudel Snk ( AM):  L ( PM):skipped, or broccoli and cheese,, baked potato, Snk ( PM):  D ( PM):Chicken sandwich at [employer] and fries, sprite Snk ( PM):  Beverages: Soda, tea, water  Usual physical activity: ADL  Estimated energy needs: 1200  calories 135  g carbohydrates 90 g protein 33 g fat  Progress Towards Goal(s):  In  progress.   Nutritional Diagnosis:  NI-1.7 Predicted excessive energy intake As related to high fat high sugar high salt diet.  As evidenced by Morbid obesity BMI > 40 and Insulin levels of 55.4 and A1C 5.5.%.Marland Kitchen    Intervention:  Nutrition and Diabetes education provided on My Plate, CHO counting, meal planning, portion sizes, timing of meals, avoiding snacks between meals , target ranges for A1C and blood sugars,benefits of exercising 30 minutes per day and prevention of complications of DM.  Goals 1. Follow Plate Method 2. Don't  skip meals 3. Increase fresh fruits and vegetables. 4. Drink water. 5. Walk 30 minutes daily. Lose 1 lb per weight  Teaching Method Utilized:  Visual Auditory Hands on  Handouts given during visit include:  The Plate Method  Meal Plan Card  Diabetes Instructions.   Barriers to learning/adherence to lifestyle change:  None  Demonstrated degree of understanding via:  Teach Back   Monitoring/Evaluation:  Dietary intake, exercise, meal planning, and body weight in 1 month(s).  She may benfit from 1000 mg of Metformin BID or ER version.

## 2016-10-16 NOTE — Telephone Encounter (Signed)
TC and talked to pt's mom. Metformin has been ordered from Dr. Lance Sell office.. They can pick it up at pharmacy. Reminded pt's mother to make sure patient takes Metformin AFTER breakfast and sometime after supper. Do not take on an empty stomach. Pt's mother verbalized understanding. Pt. Wasn't home.

## 2016-10-17 NOTE — Telephone Encounter (Signed)
Has been sent in  

## 2016-11-10 ENCOUNTER — Telehealth: Payer: Self-pay | Admitting: Family Medicine

## 2016-11-10 MED ORDER — ONDANSETRON HCL 8 MG PO TABS: ORAL_TABLET | ORAL | 0 refills | Status: DC

## 2016-11-10 NOTE — Telephone Encounter (Signed)
More than likely an intestinal virus it but if it persists or gets worse she will need to be seen here or in the ER recommend Zofran 8 mg tablet one 3 times a day when necessary nausea #15 with one refill

## 2016-11-10 NOTE — Telephone Encounter (Signed)
Spoke with patient's mother and patient's mother stated that patient is not experiencing any other symptoms besides the nausea and vomiting. Symptoms started over night. Please advise?

## 2016-11-10 NOTE — Telephone Encounter (Signed)
Pt is needing something for nausea/vomiting called in if possible.     Festus Barren

## 2016-11-10 NOTE — Telephone Encounter (Signed)
Spoke with patient's mother and informed her per Dr.Scott Luking- More than likely an intestinal virus but if it persists or gets worse she will need to be seen here or in the ER recommend Zofran 8 mg tablet one 3 times a day when necessary for nausea. Patient's mother verbalized understanding.

## 2016-11-17 ENCOUNTER — Ambulatory Visit: Payer: Medicaid Other | Admitting: Nutrition

## 2016-11-20 ENCOUNTER — Encounter: Payer: Medicaid Other | Attending: Family Medicine | Admitting: Nutrition

## 2016-11-20 VITALS — Wt 198.0 lb

## 2016-11-20 DIAGNOSIS — R739 Hyperglycemia, unspecified: Secondary | ICD-10-CM

## 2016-11-20 DIAGNOSIS — Z713 Dietary counseling and surveillance: Secondary | ICD-10-CM | POA: Insufficient documentation

## 2016-11-20 DIAGNOSIS — E669 Obesity, unspecified: Secondary | ICD-10-CM

## 2016-11-20 NOTE — Patient Instructions (Addendum)
Goals 1. Drink 3 Yetti cups of water 2. Increase low carb vegetables- 2 with lunch and dinner 3.  Walk 15 minutes daily. Lose 3 lbs per month

## 2016-11-20 NOTE — Progress Notes (Signed)
  Medical Nutrition Therapy:  Appt start time: 1630  end time:  0900.  Assessment:  Primary concerns today: Obesity and prediabetes.  Here with her mom today. Changes:  Says she h as cut out candy, cut out sugared cereals. Now eating more lower sugared cereals., drinking more water.. Has quit skipping meal. Eating more fresh fruits and vegetables. On bipolar meds that have made her gain weight,  Eating more wheat bread and whole grain foods. However, her mom says she eats late at night and sometimes is snacking and not exercising like she should. Has Insulinemia with levels of 55.4.  She is suppose to be taking 500 mg of Metformin twice a day.  Lost 1 lb since last visit.   Wt Readings from Last 3 Encounters:  10/16/16 199 lb (90.3 kg)  09/18/16 196 lb 6.4 oz (89.1 kg)  02/05/16 197 lb 2 oz (89.4 kg)   Ht Readings from Last 3 Encounters:  10/16/16 5\' 1"  (1.549 m)  09/18/16 5\' 2"  (1.575 m)  02/05/16 5\' 2"  (1.575 m)   There is no height or weight on file to calculate BMI.  Preferred Learning Style:  No preference indicated   Learning Readiness:  Change in progress   MEDICATIONS: See list   DIETARY INTAKE:   24-hr recall:  B ( AM): Captain Crunch with 2% milk,  Snk ( AM):  L ( PM): Bologna sandwich with wheat bread,  Koolaid  Snk ( PM):  D ( PM): Plizza 2 slices, Water. Snk ( PM):  Beverages:  Usual physical activity: ADL  Estimated energy needs: 1200  calories 135  g carbohydrates 90 g protein 33 g fat  Progress Towards Goal(s):  In progress.   Nutritional Diagnosis:  NI-1.7 Predicted excessive energy intake As related to high fat high sugar high salt diet.  As evidenced by Morbid obesity BMI > 40 and Insulin levels of 55.4 and A1C 5.5.%.Marland Kitchen    Intervention:  Nutrition and Diabetes education provided on My Plate, CHO counting, meal planning, portion sizes, timing of meals, avoiding snacks between meals , target ranges for A1C and blood sugars,benefits of  exercising 30 minutes per day and prevention of complications of DM.    Teaching Method Utilized:  Visual Auditory Hands on  Handouts given during visit include:  The Plate Method  Meal Plan Card  Diabetes Instructions.   Barriers to learning/adherence to lifestyle change:  None  Demonstrated degree of understanding via:  Teach Back   Monitoring/Evaluation:  Dietary intake, exercise, meal planning, and body weight in 1 month(s).  She may benfit from 1000 mg of Metformin BID or ER version.

## 2016-12-22 ENCOUNTER — Encounter: Payer: Medicaid Other | Attending: Family Medicine | Admitting: Nutrition

## 2016-12-22 DIAGNOSIS — Z713 Dietary counseling and surveillance: Secondary | ICD-10-CM | POA: Insufficient documentation

## 2016-12-22 DIAGNOSIS — E781 Pure hyperglyceridemia: Secondary | ICD-10-CM

## 2016-12-22 DIAGNOSIS — R739 Hyperglycemia, unspecified: Secondary | ICD-10-CM

## 2016-12-22 NOTE — Patient Instructions (Signed)
Goals 1. Walking 30 minutes every day. 2. Increase fresh fruits and vegetables- 1 piece of fruit with each meal and 2 serving of vegetables with lunch and dinner. 3. Lose 2 lbs  per month.

## 2016-12-22 NOTE — Progress Notes (Signed)
Medical Nutrition Therapy:  Appt start time: 1600  end time:  1630  Assessment:  Primary concerns today: Obesity and prediabetes.  Here with her mom today. Changes:walking some. Doesn't like to walk by herself.. She notes she has cut out some junk food and eating more fresh fruit but not many vegetables as she doesn't like them. Her mom reports she tends to eat/snack more at night. Drinking some water.   Gained 2 lbs.  Elevated TG of 161 mg/dl. HDL Low at 38. A1C 5.5% Metformin 500 mg BID but sometimes forgets one of the doses daily.  Still working on making lifestyle changes that are consistent to provide needed weight loss. She needs better engagement for compliance with diet and meds. Diet continues to be insuffient to meet her needs for weight loss and improved health..  Diet remains high in fat and salt and low in fresh fruits, vegetables. She is a picky eater.  Wt Readings from Last 3 Encounters:  12/22/16 201 lb (91.2 kg)  11/20/16 198 lb (89.8 kg)  10/16/16 199 lb (90.3 kg)   Ht Readings from Last 3 Encounters:  10/16/16 5\' 1"  (1.549 m)  09/18/16 5\' 2"  (1.575 m)  02/05/16 5\' 2"  (1.575 m)   Body mass index is 37.98 kg/m. Lab Results  Component Value Date   HGBA1C 5.5 10/13/2016   Lipid Panel     Component Value Date/Time   CHOL 163 10/13/2016 0746   TRIG 161 (H) 10/13/2016 0746   HDL 38 (L) 10/13/2016 0746   CHOLHDL 4.3 10/13/2016 0746   VLDL 32 (H) 10/13/2016 0746   LDLCALC 93 10/13/2016 0746   CMP Latest Ref Rng & Units 10/13/2016 02/05/2016 02/03/2013  Glucose 65 - 99 mg/dL 136(H) 116(H) 83  BUN 7 - 25 mg/dL 11 11 9   Creatinine 0.50 - 1.10 mg/dL 0.57 0.67 0.54  Sodium 135 - 146 mmol/L 137 141 138  Potassium 3.5 - 5.3 mmol/L 4.5 4.9 4.8  Chloride 98 - 110 mmol/L 100 97 100  CO2 20 - 31 mmol/L 27 27 27   Calcium 8.6 - 10.2 mg/dL 9.1 9.3 9.9  Total Protein 6.1 - 8.1 g/dL 6.6 - 7.5  Total Bilirubin 0.2 - 1.2 mg/dL 0.3 - 0.5  Alkaline Phos 33 - 115 U/L 52 - 70  AST 10  - 30 U/L 10 - 17  ALT 6 - 29 U/L 7 - 14    .   Preferred Learning Style:  No preference indicated   Learning Readiness:  Change in progress   MEDICATIONS: See list   DIETARY INTAKE:   24-hr recall:  B ( AM):  Raveloi 1 can, water Snk ( AM):  L ( PM): hasn't eaten today; jelly sandwich on wheat bread, water Snk ( PM):  D ( PM):  Cheeseburger with bun, water Snk ( PM):  Beverages:  Usual physical activity: ADL  Estimated energy needs: 1200  calories 135  g carbohydrates 90 g protein 33 g fat  Progress Towards Goal(s):  In progress.   Nutritional Diagnosis:  NI-1.7 Predicted excessive energy intake As related to high fat high sugar high salt diet.  As evidenced by Morbid obesity BMI > 40 and Insulin levels of 55.4 and A1C 5.5.%.Marland Kitchen    Intervention:  Nutrition and Diabetes education provided on My Plate, CHO counting, meal planning, portion sizes, timing of meals, avoiding snacks between meals , target ranges for A1C and blood sugars,benefits of exercising 30 minutes per day and prevention of complications of DM.  Goals 1. Walking 30 minutes every day. 2. Increase fresh fruits and vegetables- 1 piece of fruit with each meal and 2 serving of vegetables with lunch and dinner. 3. Lose 2 lbs  per month.   Teaching Method Utilized:  Visual Auditory Hands on  Handouts given during visit include:  The Plate Method  Meal Plan Card  Diabetes Instructions.   Barriers to learning/adherence to lifestyle change:  None  Demonstrated degree of understanding via:  Teach Back   Monitoring/Evaluation:  Dietary intake, exercise, meal planning, and body weight in 1 month(s).  She may benfit from 1000 mg of Metformin BID or ER version.

## 2017-01-22 ENCOUNTER — Encounter: Payer: Medicaid Other | Attending: Family Medicine | Admitting: Nutrition

## 2017-01-22 VITALS — Wt 198.0 lb

## 2017-01-22 DIAGNOSIS — Z713 Dietary counseling and surveillance: Secondary | ICD-10-CM | POA: Insufficient documentation

## 2017-01-22 DIAGNOSIS — Z6837 Body mass index (BMI) 37.0-37.9, adult: Secondary | ICD-10-CM | POA: Diagnosis not present

## 2017-01-22 NOTE — Progress Notes (Signed)
Medical Nutrition Therapy:  Appt start time: 8756 end time:  1600  Assessment:  Primary concerns today: Obesity and prediabetes. Lost 3 lbs since last visit.   Here with her mom. Her mom says she hasn't changed much at all and eats and lays down. Hasn't been exercising much. Still eating sugar cereals. Meals are not well balanced. Her mom works third shift and sleep during day. She is a picky eater. Not fully engaged in making lifestyle changes to significantly improve her weight. Her Biploar meds and cognitive function may limit her to take on more responsibility to make necessary changes for significant weight loss.   Diet remains higher in sugar and fat. Diet low in fresh fruits, vegetables, whole grains and high fiber foods.  Wt Readings from Last 3 Encounters:  01/22/17 198 lb (89.8 kg)  12/22/16 201 lb (91.2 kg)  11/20/16 198 lb (89.8 kg)   Ht Readings from Last 3 Encounters:  10/16/16 5\' 1"  (1.549 m)  09/18/16 5\' 2"  (1.575 m)  02/05/16 5\' 2"  (1.575 m)   Body mass index is 37.41 kg/m. Lab Results  Component Value Date   HGBA1C 5.5 10/13/2016   Lipid Panel     Component Value Date/Time   CHOL 163 10/13/2016 0746   TRIG 161 (H) 10/13/2016 0746   HDL 38 (L) 10/13/2016 0746   CHOLHDL 4.3 10/13/2016 0746   VLDL 32 (H) 10/13/2016 0746   LDLCALC 93 10/13/2016 0746   CMP Latest Ref Rng & Units 10/13/2016 02/05/2016 02/03/2013  Glucose 65 - 99 mg/dL 136(H) 116(H) 83  BUN 7 - 25 mg/dL 11 11 9   Creatinine 0.50 - 1.10 mg/dL 0.57 0.67 0.54  Sodium 135 - 146 mmol/L 137 141 138  Potassium 3.5 - 5.3 mmol/L 4.5 4.9 4.8  Chloride 98 - 110 mmol/L 100 97 100  CO2 20 - 31 mmol/L 27 27 27   Calcium 8.6 - 10.2 mg/dL 9.1 9.3 9.9  Total Protein 6.1 - 8.1 g/dL 6.6 - 7.5  Total Bilirubin 0.2 - 1.2 mg/dL 0.3 - 0.5  Alkaline Phos 33 - 115 U/L 52 - 70  AST 10 - 30 U/L 10 - 17  ALT 6 - 29 U/L 7 - 14    .   Preferred Learning Style:  No preference indicated   Learning Readiness:  Change in  progress   MEDICATIONS: See list   DIETARY INTAKE:   24-hr recall:  B ( AM):  Fruitloops or chex with 2% milk Snk ( AM):  L ( PM): Skipped yesterday;  Broccoli and cheese, water Snk ( PM):  D ( PM): [place]  1 helping, Snk ( PM):  Beverages: water Usual physical activity: ADL  Estimated energy needs: 1200  calories 135  g carbohydrates 90 g protein 33 g fat  Progress Towards Goal(s):  In progress.   Nutritional Diagnosis:  NI-1.7 Predicted excessive energy intake As related to high fat high sugar high salt diet.  As evidenced by Morbid obesity BMI > 40 and Insulin levels of 5.4 and A1C 5.5.%.Marland Kitchen    Intervention:  Nutrition and Diabetes education provided on My Plate, CHO counting, meal planning, portion sizes, timing of meals, avoiding snacks between meals , target ranges for A1C and blood sugars,benefits of exercising 30 minutes per day and prevention of complications of DM.  Goals 1. Aim for 5,000 steps a day 2. Increase fresh fruit and vegetables. 3. Drink more water 4. Lose 4 lbs per month   Teaching Method Utilized:  Visual  Auditory Hands on  Handouts given during visit include:  The Plate Method  Meal Plan Card  Diabetes Instructions.   Barriers to learning/adherence to lifestyle change:  None  Demonstrated degree of understanding via:  Teach Back   Monitoring/Evaluation:  Dietary intake, exercise, meal planning, and body weight in 3  month(s).

## 2017-01-22 NOTE — Patient Instructions (Signed)
Goals 1. Aim for 5,000 steps a day 2. Increase fresh fruit and vegetables. 3. Drink more water 4. Lose 4 lbs per month

## 2017-02-02 ENCOUNTER — Other Ambulatory Visit: Payer: Self-pay | Admitting: Nurse Practitioner

## 2017-02-10 ENCOUNTER — Ambulatory Visit (INDEPENDENT_AMBULATORY_CARE_PROVIDER_SITE_OTHER): Payer: Medicaid Other | Admitting: Family Medicine

## 2017-02-10 ENCOUNTER — Encounter: Payer: Self-pay | Admitting: Family Medicine

## 2017-02-10 VITALS — BP 122/80 | Temp 99.0°F | Ht 61.0 in | Wt 200.0 lb

## 2017-02-10 DIAGNOSIS — J329 Chronic sinusitis, unspecified: Secondary | ICD-10-CM | POA: Diagnosis not present

## 2017-02-10 DIAGNOSIS — J310 Chronic rhinitis: Secondary | ICD-10-CM

## 2017-02-10 MED ORDER — HYDROCODONE-HOMATROPINE 5-1.5 MG/5ML PO SYRP: ORAL_SOLUTION | ORAL | 0 refills | Status: DC

## 2017-02-10 MED ORDER — AMOXICILLIN-POT CLAVULANATE 875-125 MG PO TABS: 1.0000 | ORAL_TABLET | Freq: Two times a day (BID) | ORAL | 0 refills | Status: DC

## 2017-02-10 NOTE — Progress Notes (Signed)
   Subjective:    Patient ID: Shannon Lin, female    DOB: Dec 06, 1990, 26 y.o.   MRN: [id number]  Sinusitis  This is a new problem. Episode onset: one week ago. Associated symptoms include coughing, headaches and a sore throat. Treatments tried: allergy meds.   Bad cough, dry and hacky  givn flonase could and cough, gel tabs  Pos prod uctive cough   No hx of snoking   No sig hx of wheeing   Very significant cough worse at night sometimes productive     Review of Systems  HENT: Positive for sore throat.   Respiratory: Positive for cough.   Neurological: Positive for headaches.       Objective:   Physical Exam Alert, mild malaise. Hydration good Vitals stable. frontal/ maxillary tenderness evident positive nasal congestion. pharynx normal neck supple  lungs clear/no crackles or wheezes. heart regular in rhythm        Assessment & Plan:  Impression rhinosinusitis /Bronchitis likely post viral, discussed with patient. plan antibiotics prescribed. Questions answered. Symptomatic care discussed. warning signs discussed. WSL

## 2017-02-23 ENCOUNTER — Other Ambulatory Visit: Payer: Self-pay | Admitting: Nurse Practitioner

## 2017-04-29 ENCOUNTER — Ambulatory Visit: Payer: Medicaid Other | Admitting: Nutrition

## 2017-05-01 ENCOUNTER — Encounter: Payer: Self-pay | Admitting: Nurse Practitioner

## 2017-05-01 ENCOUNTER — Ambulatory Visit (INDEPENDENT_AMBULATORY_CARE_PROVIDER_SITE_OTHER): Payer: Medicaid Other | Admitting: Nurse Practitioner

## 2017-05-01 VITALS — Ht 61.0 in | Wt 200.0 lb

## 2017-05-01 DIAGNOSIS — R739 Hyperglycemia, unspecified: Secondary | ICD-10-CM

## 2017-05-01 DIAGNOSIS — N912 Amenorrhea, unspecified: Secondary | ICD-10-CM | POA: Diagnosis not present

## 2017-05-01 LAB — POCT GLYCOSYLATED HEMOGLOBIN (HGB A1C): Hemoglobin A1C: 5

## 2017-05-01 LAB — POCT URINE PREGNANCY: PREG TEST UR: NEGATIVE

## 2017-05-04 ENCOUNTER — Encounter: Payer: Self-pay | Admitting: Family Medicine

## 2017-05-04 ENCOUNTER — Encounter: Payer: Self-pay | Admitting: Nurse Practitioner

## 2017-05-04 NOTE — Progress Notes (Signed)
Subjective:  Presents with her mother to discuss birth control and blood sugar. Patient insists that she has not been sexually active with her current boyfriend. Her mother was upset because she found unused birth control pills in her room. Wants to be sure she is not pregnant. Patient has mental health and maturity issues. Needs supervision and help but very rebellious against her mother. Continues to receive treatment at mental health. Was off all of her meds x 1 month until her mother encouraged her to restart. No cycle since May. Mother states she has gained about 9 lbs over a short period of time. Patient initially very adamant about not getting pregnancy test. Stated her mother told her she would have to get an abortion although patient denies sexual activity.   Objective:   Ht 5\' 1"  (1.549 m)   Wt 200 lb (90.7 kg)   BMI 37.79 kg/m  NAD. Alert, oriented. Lungs clear. Heart RRR.   Results for orders placed or performed in visit on 05/01/17  POCT glycosylated hemoglobin (Hb A1C)  Result Value Ref Range   Hemoglobin A1C 5.0   POCT urine pregnancy  Result Value Ref Range   Preg Test, Ur Negative Negative     Assessment:  Elevated blood sugar - Plan: POCT glycosylated hemoglobin (Hb A1C)  Amenorrhea - Plan: POCT urine pregnancy, Ambulatory referral to Gynecology    Plan:    Refer to gynecology for Nexplanon insertion. Reviewed safe sex issues. Encouraged increased activity and weight loss. Over 50% of this visit was spent in discussion.

## 2017-05-11 ENCOUNTER — Telehealth: Payer: Self-pay | Admitting: Nurse Practitioner

## 2017-05-11 NOTE — Telephone Encounter (Signed)
Mom said Shannon Lin was going to contact [name] Psychologist to ask about weight loss medicine and wondered if she had done that yet?

## 2017-05-12 ENCOUNTER — Telehealth: Payer: Self-pay | Admitting: Nurse Practitioner

## 2017-05-12 NOTE — Telephone Encounter (Signed)
#  1 don't take the medicine #2 please forward this message to [facility]

## 2017-05-12 NOTE — Telephone Encounter (Signed)
Tried to call no answer. Message being forwarded to [facility].

## 2017-05-12 NOTE — Telephone Encounter (Signed)
Pt seen Dr. Laray Anger and he stated that she can not take the Contrave due to her having history of seizures.

## 2017-05-13 NOTE — Telephone Encounter (Signed)
Please confirm the name of her psychiatrist. No longer have those notes, but we can get in touch with them about this.

## 2017-05-13 NOTE — Telephone Encounter (Signed)
Tried to call pt no answer

## 2017-05-13 NOTE — Telephone Encounter (Signed)
Please disregard the other message regarding Charity. Just sent this through. I am confused about why Contrave is even being mentioned. I may have given her a general listing of meds. The only one she can afford is Phentermine. Medicaid will not pay for the others as far as I know.

## 2017-05-13 NOTE — Telephone Encounter (Signed)
See other message

## 2017-05-14 NOTE — Telephone Encounter (Signed)
Pt's mother states specialist said phentermine would be fine to take and would like a call back after its sent to pharm.

## 2017-05-14 NOTE — Telephone Encounter (Signed)
Left message to return call 

## 2017-05-18 ENCOUNTER — Other Ambulatory Visit: Payer: Self-pay | Admitting: Nurse Practitioner

## 2017-05-18 MED ORDER — PHENTERMINE HCL 37.5 MG PO TABS: 37.5000 mg | ORAL_TABLET | Freq: Every day | ORAL | 0 refills | Status: DC

## 2017-05-18 NOTE — Telephone Encounter (Signed)
Rx printed. Take on an empty stomach. Stop med if she has any palpitations or side effects. Need to see her in one month if she wishes to continue. Do not take if pregnant. Main side effects are insomnia and dry mouth. Call back if any problems.

## 2017-05-18 NOTE — Telephone Encounter (Signed)
Telephone call no answer 

## 2017-05-19 ENCOUNTER — Encounter: Payer: Self-pay | Admitting: Adult Health

## 2017-05-19 ENCOUNTER — Ambulatory Visit (INDEPENDENT_AMBULATORY_CARE_PROVIDER_SITE_OTHER): Payer: Medicaid Other | Admitting: Adult Health

## 2017-05-19 VITALS — BP 112/72 | HR 90 | Ht 61.0 in | Wt 198.5 lb

## 2017-05-19 DIAGNOSIS — Z3009 Encounter for other general counseling and advice on contraception: Secondary | ICD-10-CM | POA: Diagnosis not present

## 2017-05-19 NOTE — Telephone Encounter (Signed)
Patient advised the prescription was faxed to the pharmacy. Per [name] take on an empty stomach. Stop med if she has any palpitations or side effects. Need to see her in one month if she wishes to continue. Do not take if pregnant. Main side effects are insomnia and dry mouth. Call back if any problems. Patient verbalized understanding.

## 2017-05-19 NOTE — Patient Instructions (Addendum)
No sex,continue OCs Order in 3 weeks for stat [facility] and nexplanon in pm
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
## 2017-06-09 ENCOUNTER — Encounter: Payer: Medicaid Other | Admitting: Adult Health

## 2017-06-09 ENCOUNTER — Other Ambulatory Visit: Payer: Self-pay | Admitting: Nurse Practitioner

## 2017-06-09 ENCOUNTER — Other Ambulatory Visit: Payer: Medicaid Other

## 2017-06-10 ENCOUNTER — Telehealth: Payer: Self-pay | Admitting: Nurse Practitioner

## 2017-06-10 NOTE — Telephone Encounter (Signed)
Requesting refill for birth control pills.  Her appointment at [facility] had to be put off until 06/17/17 for her Implanon.  [facility] [place]

## 2017-06-11 ENCOUNTER — Other Ambulatory Visit: Payer: Self-pay | Admitting: Nurse Practitioner

## 2017-06-11 MED ORDER — LEVONORGEST-ETH ESTRAD 91-DAY 0.15-0.03 MG PO TABS: 1.0000 | ORAL_TABLET | Freq: Every day | ORAL | 0 refills | Status: DC

## 2017-06-11 NOTE — Telephone Encounter (Signed)
done

## 2017-06-17 ENCOUNTER — Encounter: Payer: Medicaid Other | Admitting: Advanced Practice Midwife

## 2017-06-17 ENCOUNTER — Other Ambulatory Visit: Payer: Medicaid Other

## 2017-08-26 ENCOUNTER — Telehealth: Payer: Self-pay | Admitting: Family Medicine

## 2017-08-26 ENCOUNTER — Other Ambulatory Visit: Payer: Self-pay | Admitting: *Deleted

## 2017-08-26 MED ORDER — VALACYCLOVIR HCL 1 G PO TABS: ORAL_TABLET | ORAL | 3 refills | Status: DC

## 2017-08-26 NOTE — Telephone Encounter (Signed)
Ok plus 3 ref 

## 2017-08-26 NOTE — Telephone Encounter (Signed)
Med sent to pharm. Pt notified.  

## 2017-08-26 NOTE — Telephone Encounter (Signed)
Patient requesting new prescription on valtrex 1000 mg for a fever blister on her lip lasted filled in 2016 last seen 05/01/17. Call into [facility].

## 2017-09-19 ENCOUNTER — Encounter: Payer: Self-pay | Admitting: Nurse Practitioner

## 2017-10-09 ENCOUNTER — Other Ambulatory Visit: Payer: Self-pay | Admitting: Nurse Practitioner

## 2017-10-09 ENCOUNTER — Telehealth: Payer: Self-pay | Admitting: Nurse Practitioner

## 2017-10-09 DIAGNOSIS — F419 Anxiety disorder, unspecified: Secondary | ICD-10-CM

## 2017-10-09 MED ORDER — LAMOTRIGINE 200 MG PO TABS: 200.0000 mg | ORAL_TABLET | Freq: Every day | ORAL | 0 refills | Status: DC

## 2017-10-09 NOTE — Telephone Encounter (Signed)
They would prefer to see someone local, not any where in [facility]

## 2017-10-09 NOTE — Telephone Encounter (Signed)
Patient tried to get a refill on her Lamictal, but she just found out that [facility] are closed and she is unable to get this.  Mom said she will be out tomorrow and wants to know if [name] can refill this for her and also what she needs to do about getting into another doctor.     [facility]

## 2017-10-09 NOTE — Telephone Encounter (Signed)
Sent in one month refills. Will send information to our referral coordinator to see what is available. Let us know if she has someone she does NOT want her to see. Thanks.

## 2017-10-10 NOTE — Telephone Encounter (Signed)
Brendale, please see referral. Here we go again. Please see what you can do. I have given her refills so she will not run out of medication.

## 2017-10-12 ENCOUNTER — Encounter: Payer: Self-pay | Admitting: Family Medicine

## 2017-10-12 ENCOUNTER — Other Ambulatory Visit: Payer: Self-pay | Admitting: *Deleted

## 2017-10-12 NOTE — Telephone Encounter (Signed)
Is this a referral for psychiatry and is the diagnosis for depression?

## 2017-10-12 NOTE — Telephone Encounter (Signed)
Has already been sent to [facility]. No further action needed.

## 2017-10-14 ENCOUNTER — Encounter: Payer: Self-pay | Admitting: Nurse Practitioner

## 2017-10-14 ENCOUNTER — Encounter: Payer: Self-pay | Admitting: Family Medicine

## 2017-10-15 ENCOUNTER — Other Ambulatory Visit: Payer: Self-pay | Admitting: Nurse Practitioner

## 2017-10-22 ENCOUNTER — Ambulatory Visit: Payer: Medicaid Other | Admitting: Family Medicine

## 2017-10-22 VITALS — Temp 98.2°F | Wt 205.8 lb

## 2017-10-22 DIAGNOSIS — R7303 Prediabetes: Secondary | ICD-10-CM | POA: Diagnosis not present

## 2017-10-22 DIAGNOSIS — R739 Hyperglycemia, unspecified: Secondary | ICD-10-CM | POA: Diagnosis not present

## 2017-10-22 DIAGNOSIS — J019 Acute sinusitis, unspecified: Secondary | ICD-10-CM

## 2017-10-22 DIAGNOSIS — F99 Mental disorder, not otherwise specified: Secondary | ICD-10-CM | POA: Diagnosis not present

## 2017-10-22 DIAGNOSIS — E785 Hyperlipidemia, unspecified: Secondary | ICD-10-CM | POA: Diagnosis not present

## 2017-10-22 DIAGNOSIS — R5383 Other fatigue: Secondary | ICD-10-CM

## 2017-10-22 MED ORDER — LAMOTRIGINE 200 MG PO TABS: 200.0000 mg | ORAL_TABLET | Freq: Every day | ORAL | 0 refills | Status: DC

## 2017-10-22 MED ORDER — DIVALPROEX SODIUM ER 500 MG PO TB24: 1000.0000 mg | ORAL_TABLET | Freq: Every day | ORAL | 0 refills | Status: DC

## 2017-10-22 MED ORDER — AMOXICILLIN 500 MG PO TABS: 500.0000 mg | ORAL_TABLET | Freq: Three times a day (TID) | ORAL | 0 refills | Status: DC

## 2017-10-22 MED ORDER — ARIPIPRAZOLE 15 MG PO TABS: 15.0000 mg | ORAL_TABLET | Freq: Every day | ORAL | 0 refills | Status: DC

## 2017-10-22 MED ORDER — METFORMIN HCL 500 MG PO TABS: ORAL_TABLET | ORAL | 2 refills | Status: DC

## 2017-10-22 NOTE — Progress Notes (Signed)
Referral ordered in Epic. 

## 2017-10-22 NOTE — Addendum Note (Signed)
Addended by: Dairl Ponder on: 10/22/2017 04:05 PM   Modules accepted: Orders

## 2017-10-22 NOTE — Progress Notes (Signed)
   Subjective:    Patient ID: Shannon Lin, female    DOB: [birth year], 27 y.o.   MRN: [id number]  Cough  This is a new problem. The current episode started in the past 7 days. The problem has been gradually worsening. The problem occurs constantly. The cough is productive of purulent sputum. Associated symptoms include nasal congestion, rhinorrhea and a sore throat. Pertinent negatives include no chest pain, chills, ear congestion, ear pain, heartburn, hemoptysis, myalgias, shortness of breath, sweats or weight loss. Nothing aggravates the symptoms. She has tried nothing for the symptoms. The treatment provided no relief.  PMH psychiatric illness, developmental issues, morbid obesity    Review of Systems  Constitutional: Negative for chills and weight loss.  HENT: Positive for rhinorrhea and sore throat. Negative for ear pain.   Respiratory: Positive for cough. Negative for hemoptysis and shortness of breath.   Cardiovascular: Negative for chest pain.  Gastrointestinal: Negative for heartburn.  Musculoskeletal: Negative for myalgias.       Objective:   Physical Exam  Constitutional: She appears well-developed.  HENT:  Head: Normocephalic.  Right Ear: External ear normal.  Left Ear: External ear normal.  Nose: Nose normal.  Mouth/Throat: Oropharynx is clear and moist. No oropharyngeal exudate.  Eyes: Right eye exhibits no discharge. Left eye exhibits no discharge.  Neck: Neck supple. No tracheal deviation present.  Cardiovascular: Normal rate and normal heart sounds.  No murmur heard. Pulmonary/Chest: Effort normal and breath sounds normal. She has no wheezes. She has no rales.  Lymphadenopathy:    She has no cervical adenopathy.  Skin: Skin is warm and dry.  Nursing note and vitals reviewed.         Assessment & Plan:  Viral syndrome Secondary sinusitis Plenty of rest antibiotics prescribed warnings discussed  In addition this morbid obesity watch diet try to  increase regular physical activity lab work ordered regular follow-up visit  Psychiatric illness having difficult time finding psychiatrist may need referral  [facility]annon Lin psychiatry

## 2017-10-23 ENCOUNTER — Encounter: Payer: Self-pay | Admitting: Family Medicine

## 2017-11-16 ENCOUNTER — Encounter: Payer: Self-pay | Admitting: Nurse Practitioner

## 2017-11-17 LAB — BASIC METABOLIC PANEL
BUN / CREAT RATIO: 9 (ref 9–23)
BUN: 6 mg/dL (ref 6–20)
CO2: 25 mmol/L (ref 20–29)
CREATININE: 0.68 mg/dL (ref 0.57–1.00)
Calcium: 9.7 mg/dL (ref 8.7–10.2)
Chloride: 97 mmol/L (ref 96–106)
GFR calc non Af Amer: 120 mL/min/{1.73_m2} (ref >59)
GFR, EST AFRICAN AMERICAN: 139 mL/min/{1.73_m2} (ref >59)
GLUCOSE: 92 mg/dL (ref 65–99)
Potassium: 5 mmol/L (ref 3.5–5.2)
SODIUM: 138 mmol/L (ref 134–144)

## 2017-11-17 LAB — HEMOGLOBIN A1C
Est. average glucose Bld gHb Est-mCnc: 120 mg/dL
Hgb A1c MFr Bld: 5.8 % — ABNORMAL HIGH (ref 4.8–5.6)

## 2017-11-17 LAB — LIPID PANEL
CHOL/HDL RATIO: 4.6 ratio — AB (ref 0.0–4.4)
CHOLESTEROL TOTAL: 187 mg/dL (ref 100–199)
HDL: 41 mg/dL (ref >39)
LDL Calculated: 100 mg/dL — ABNORMAL HIGH (ref 0–99)
Triglycerides: 232 mg/dL — ABNORMAL HIGH (ref 0–149)
VLDL Cholesterol Cal: 46 mg/dL — ABNORMAL HIGH (ref 5–40)

## 2017-11-17 LAB — HEPATIC FUNCTION PANEL
ALT: 11 IU/L (ref 0–32)
AST: 12 IU/L (ref 0–40)
Albumin: 4.4 g/dL (ref 3.5–5.5)
Alkaline Phosphatase: 70 IU/L (ref 39–117)
BILIRUBIN, DIRECT: 0.1 mg/dL (ref 0.00–0.40)
Bilirubin Total: 0.4 mg/dL (ref 0.0–1.2)
Total Protein: 7.2 g/dL (ref 6.0–8.5)

## 2017-11-17 LAB — TSH: TSH: 4.53 u[IU]/mL — AB (ref 0.450–4.500)

## 2017-11-17 NOTE — Addendum Note (Signed)
Addended by: Dairl Ponder on: 11/17/2017 11:18 AM   Modules accepted: Orders

## 2017-11-18 ENCOUNTER — Other Ambulatory Visit: Payer: Self-pay | Admitting: Family Medicine

## 2017-11-18 DIAGNOSIS — F99 Mental disorder, not otherwise specified: Secondary | ICD-10-CM

## 2017-11-18 NOTE — Telephone Encounter (Signed)
Please confirm with mom is she taking this medication if so she may have a 38-month supply, 30 days with 5 refills

## 2017-11-19 NOTE — Telephone Encounter (Signed)
Spoke with mom and she stated that pt was taking the med. Mom also stated that she was trying to get in with Mental Health and the lady at [facility] said that she could sleep in car while Analysse was being seen.(Mom works 3rd shift). Mom said that hours for initial visit are 8am-2:30pm. Mom said that a co worker told her that [facility] also did meds.

## 2017-11-19 NOTE — Telephone Encounter (Signed)
Attempted to contact mom, phone kept ringing

## 2017-11-20 NOTE — Telephone Encounter (Signed)
May have this +4 refills if they need a referral please give referral

## 2017-11-23 ENCOUNTER — Encounter: Payer: Self-pay | Admitting: Nurse Practitioner

## 2017-11-25 ENCOUNTER — Telehealth: Payer: Self-pay | Admitting: *Deleted

## 2017-11-25 ENCOUNTER — Other Ambulatory Visit: Payer: Self-pay | Admitting: *Deleted

## 2017-11-25 ENCOUNTER — Other Ambulatory Visit: Payer: Self-pay

## 2017-11-25 DIAGNOSIS — R7989 Other specified abnormal findings of blood chemistry: Secondary | ICD-10-CM

## 2017-11-25 MED ORDER — DIVALPROEX SODIUM ER 500 MG PO TB24: ORAL_TABLET | ORAL | 1 refills | Status: DC

## 2017-11-25 NOTE — Telephone Encounter (Signed)
Mother requesting a refill on depakote er 500mg . States she takes 2 qhs. Almost out. Tried to get in with mental health and was told she has to come up there and wait for the first appointment. Could not make appt. Mother works 3 rd shift and was told first appt could take about 3 hours to wait. Mother states she was told to just sleep in the car in the parking lot. Mother states she is going to try and take her as soon as she can but wants to know if you will refill for now.  [facility] [place]

## 2017-11-25 NOTE — Telephone Encounter (Signed)
Med was sent and left a message with pt's mother.

## 2017-11-25 NOTE — Telephone Encounter (Signed)
May have this +1 additional refill-in the long run she needs to be under the care of mental health for this issue

## 2017-11-26 ENCOUNTER — Encounter: Payer: Self-pay | Admitting: Family Medicine

## 2017-11-30 NOTE — Telephone Encounter (Signed)
Mother is aware. 

## 2017-11-30 NOTE — Telephone Encounter (Signed)
I called and spoke with [facility], I asked that she have her mother r/c.

## 2017-12-04 ENCOUNTER — Encounter: Payer: Self-pay | Admitting: Nurse Practitioner

## 2017-12-04 NOTE — Telephone Encounter (Signed)
Called pt to get more info. She has not taken any pepto or iron or multi vit with iron. No abdominal pain, no fever. She states it happened one time today and she noticied dark blood when wiping afterwards. Consult with carolyn and carolyn wants pt to do hemoccult cards. Go to Ed if she is having abdominal pain, fever, or vomiting. Pt verbalized understanding. Hemoccult cards at front for pt to pick up and pt given instructions for the hemoccult cards. Pt advised if she wanted to get checked out today to go to urgent care of ED. Pt just wanted to do hemoccult cards.

## 2017-12-07 ENCOUNTER — Other Ambulatory Visit: Payer: Self-pay | Admitting: Nurse Practitioner

## 2017-12-20 ENCOUNTER — Other Ambulatory Visit: Payer: Self-pay | Admitting: Family Medicine

## 2017-12-21 NOTE — Telephone Encounter (Signed)
Please find out from mom if they have made any progress with psychiatry referral.  May have 1 additional month of each med

## 2017-12-22 ENCOUNTER — Telehealth: Payer: Self-pay | Admitting: Family Medicine

## 2017-12-22 ENCOUNTER — Telehealth: Payer: Self-pay | Admitting: *Deleted

## 2017-12-22 ENCOUNTER — Other Ambulatory Visit: Payer: Self-pay | Admitting: *Deleted

## 2017-12-22 MED ORDER — METFORMIN HCL 500 MG PO TABS: ORAL_TABLET | ORAL | 2 refills | Status: DC

## 2017-12-22 MED ORDER — GUANFACINE HCL ER 3 MG PO TB24: 1.0000 | ORAL_TABLET | Freq: Every morning | ORAL | 2 refills | Status: DC

## 2017-12-22 MED ORDER — ARIPIPRAZOLE 15 MG PO TABS: 15.0000 mg | ORAL_TABLET | Freq: Every day | ORAL | 0 refills | Status: DC

## 2017-12-22 MED ORDER — LAMOTRIGINE 200 MG PO TABS: 200.0000 mg | ORAL_TABLET | Freq: Every day | ORAL | 0 refills | Status: DC

## 2017-12-22 NOTE — Telephone Encounter (Signed)
One month sent to pharm. appt was suppose to be today. Specialist had to reschedule for April 22nd

## 2017-12-22 NOTE — Telephone Encounter (Signed)
May have 3 refills of each, patient to do thyroid testing as ordered in late April, follow-up office visit with [place] by early May

## 2017-12-22 NOTE — Telephone Encounter (Signed)
One month refills sent per dr Nicki Reaper. Left message to return call with mother to discuss message below.   rx request from pharm for lamotrigine and aripiprazole.  Please find out from mom if they have made any progress with psychiatry referral.  May have 1 additional month of each med per dr Nicki Reaper.

## 2017-12-22 NOTE — Telephone Encounter (Signed)
Pt is needing refills on Intuniv that she was prescribed through [facility] and also metFORMIN (GLUCOPHAGE) 500 MG tablet Pt states that she is unable to be seen with the other place until after April 22. Pt also states that they told her that they do not do ADHD meds. Please advise.

## 2017-12-22 NOTE — Telephone Encounter (Signed)
Refills sent to pharm. Orders for thyroid testing were printed and mailed to pt as a reminder to do late April. Left message for pt to return call to notify her.

## 2017-12-22 NOTE — Telephone Encounter (Signed)
Tried to call no answer

## 2017-12-28 NOTE — Telephone Encounter (Signed)
Patient is aware of all and states she has already received the lab orders and medications.

## 2018-01-03 ENCOUNTER — Other Ambulatory Visit: Payer: Self-pay | Admitting: Family Medicine

## 2018-01-04 NOTE — Telephone Encounter (Signed)
Refill ordered; attempted to call patient but unable to reach anyone

## 2018-01-04 NOTE — Telephone Encounter (Signed)
It is fine to do a 90-day supply, please find out from the family winter they getting this patient in with psychiatry?

## 2018-01-20 ENCOUNTER — Other Ambulatory Visit: Payer: Self-pay | Admitting: Family Medicine

## 2018-01-20 NOTE — Telephone Encounter (Signed)
Nurses this is a complex issue.  I do not want the patient to go without medication but at the same time we cannot take on long-term care of her psychiatric illness.  She may have a 86-month prescription on these medicines.  She will need to do a follow-up office visit somewhere within the next 4-6 weeks   It is imporatnt to do referral for mental health-even [facility] we cant be her long term psych doc

## 2018-01-21 ENCOUNTER — Other Ambulatory Visit: Payer: Self-pay | Admitting: Family Medicine

## 2018-01-21 ENCOUNTER — Telehealth: Payer: Self-pay | Admitting: *Deleted

## 2018-01-21 DIAGNOSIS — F99 Mental disorder, not otherwise specified: Secondary | ICD-10-CM

## 2018-01-21 NOTE — Telephone Encounter (Signed)
Not sure why I am receiving this given that I handled this 15 hours ago please see this message

## 2018-01-21 NOTE — Telephone Encounter (Signed)
We can certainly do the ADD medications.  And we asked that the mother bring all of her psychiatric medication bottles to the visit with the psychiatrist so that they will take over the prescribing of her psychiatric medications.  When the patient comes to see me as for her ADD it is very important for the mother to relate to [place] a accurate medication list if necessary bring all bottles to that visit and or write down a specific list of medications

## 2018-01-21 NOTE — Telephone Encounter (Signed)
Mother states she is seeing psy. appt is 23rd. Mother states the receptionist told her they could do her meds after she was seen but the doctor there does not do ADHD meds and was told pcp would need to still fill these.

## 2018-01-22 NOTE — Telephone Encounter (Signed)
I called and spoke to the pt asked that she please have her mother to r/c.

## 2018-01-22 NOTE — Telephone Encounter (Signed)
Patient mother Jenny Reichmann is aware. She states she will call us back to schedule a follow up for ADHD here at [facility].

## 2018-01-26 ENCOUNTER — Telehealth: Payer: Self-pay

## 2018-01-26 ENCOUNTER — Telehealth: Payer: Self-pay | Admitting: Family Medicine

## 2018-01-26 NOTE — Telephone Encounter (Signed)
See other note dated 01/26/2018.

## 2018-01-26 NOTE — Telephone Encounter (Signed)
Patient was referred to [facility] due to her bipolar.She kept her appointment and was told they dont prescribe medication for bipolar to see her primary care doctor.Mom is very upset about this.Patient was at [facility] until they closed their doors and [facility]. Mom would like call to see where  Patient can go now because [facility] will not prescribe medications that she needs

## 2018-01-26 NOTE — Telephone Encounter (Signed)
Patient mother called stating the pt was seen today at [facility] by a Dr. Hoyle Barr and was told that they were not going to prescribe any medications to her as they only show a problem list of Add/Adhd. They had no records according to the mother of her having bipolar disorder,social anxiety ect.Mother states she is getting the run around from them. She is calling [facility] to find out why they did not send all the information over as they were supposed to she states the pt has enough medications to last her a while. I have called and left a message at [facility] asked that they r/c so I can find out if they plan on prescribing any medications for this pt.Mother is aware that Dr.Scott out of office and will address once he is back,she is ok with this as they have enough medications to last.

## 2018-01-27 NOTE — Telephone Encounter (Signed)
I spoke with Sonia Baller at [facility] and she states Dr.Lay did not schedule the pt back for a follow up as he states her primary "Target" diagnosis was ADD/ADHD and he was not going to treat that. I advised her that we had agreed to take care of her Add/Adhd and if she had other Dx of social anxiety and bipolar would they see her for that. She would not give me a straight answer. She states pt needs to call her insurance company or [place] to see whom she can see.Sonia Baller at [facility] [phone]. (you may have to call there and ask to speak with Dr.Lay)

## 2018-02-02 NOTE — Telephone Encounter (Signed)
Good go this patient does have mental health disease she needs help.lly-please  discussed with Brendale-are there are any other options for this patient- this is a sad commentary on mental health

## 2018-02-03 NOTE — Telephone Encounter (Signed)
Called [facility] [phone]) & spoke with Lurena Joiner who informed me that due to not having a contract with our office, he could not discuss pt's needs with me - pt or guardian must call  Called to speak with mom, Jenny Reichmann, she's asleep - will call again tomorrow around 3:00pm due to her work schedule & my front coverage time

## 2018-02-04 NOTE — Telephone Encounter (Signed)
Spoke with mom, she's getting Charity seen by Dr. Franchot Mimes at [facility] in [place] will call me to let me know how it goes

## 2018-02-17 ENCOUNTER — Other Ambulatory Visit: Payer: Self-pay | Admitting: *Deleted

## 2018-02-17 ENCOUNTER — Telehealth: Payer: Self-pay | Admitting: Family Medicine

## 2018-02-17 MED ORDER — DIVALPROEX SODIUM ER 500 MG PO TB24: ORAL_TABLET | ORAL | 0 refills | Status: DC

## 2018-02-17 NOTE — Telephone Encounter (Signed)
[facility] to [facility] they received rx on April 1st and they state they did not. rx resent.

## 2018-02-17 NOTE — Telephone Encounter (Signed)
Needs refill on divalproex (DEPAKOTE ER) 500 MG 24 hr until she can get in to be seen at [facility] in [place], Dr. Franchot Mimes can't see them until June 4th and she is out of medicine now.  [place]

## 2018-02-17 NOTE — Telephone Encounter (Signed)
90 day supply with one additional refill was sent in 01/04/18. Mother notified.

## 2018-02-23 ENCOUNTER — Telehealth: Payer: Self-pay

## 2018-02-23 ENCOUNTER — Ambulatory Visit: Payer: Medicaid Other | Admitting: Family Medicine

## 2018-02-23 ENCOUNTER — Encounter: Payer: Self-pay | Admitting: Family Medicine

## 2018-02-23 ENCOUNTER — Encounter: Payer: Self-pay | Admitting: Nurse Practitioner

## 2018-02-23 NOTE — Telephone Encounter (Signed)
Patient sent this message over by my chart this am to [name]: Eppie Gibson got a question for you its about my stomach I've got a pain in the middle of my right side and going to the lower part of my back on the same side of my stomach. I called the pt and she states her stomach started hurting her this am and and she is having some diarrhea. The pain begins in her right side when she has to go to the bathroom. I asked that she come in today for her to be evaluated and was scheduled at 1 pm and I had asked that she please call back earlier if no one could bring her.I came back in from lunch at 1 pm and there was another my chart message that read."I'm not going to be able to make my appointment today cause I've got nobody to come get me an bring me there." I called her back and asked could I speak with her mother she said no she was asleep as she works third shift. I advised if her pain gets worse she will need to be evaluated by some one,where [place] or Ed,urgent care. Please advise.

## 2018-02-23 NOTE — Telephone Encounter (Signed)
Lamont Snowball we certainly tried, your advice is appropriate, I think since she has already no showed here today any more atempta on her part today to reconnect with [place] should lead to [place] urging her to go to ER

## 2018-03-09 ENCOUNTER — Telehealth: Payer: Self-pay | Admitting: Nurse Practitioner

## 2018-03-09 ENCOUNTER — Other Ambulatory Visit: Payer: Self-pay | Admitting: Nurse Practitioner

## 2018-03-09 MED ORDER — LEVONORGEST-ETH ESTRAD 91-DAY 0.15-0.03 MG PO TABS: 1.0000 | ORAL_TABLET | Freq: Every day | ORAL | 3 refills | Status: DC

## 2018-03-09 NOTE — Telephone Encounter (Signed)
Patient is requesting a refill on INTROVALE 0.15-0.03 MG tablet.  [facility] on [address]

## 2018-04-24 ENCOUNTER — Encounter: Payer: Self-pay | Admitting: Nurse Practitioner

## 2018-04-26 ENCOUNTER — Other Ambulatory Visit: Payer: Self-pay | Admitting: Family Medicine

## 2018-07-23 ENCOUNTER — Other Ambulatory Visit (HOSPITAL_COMMUNITY)
Admission: RE | Admit: 2018-07-23 | Discharge: 2018-07-23 | Disposition: A | Discharge status: Home / Self Care | Payer: Medicaid Other | Source: Ambulatory Visit | Attending: Obstetrics & Gynecology | Admitting: Obstetrics & Gynecology

## 2018-07-23 ENCOUNTER — Ambulatory Visit: Payer: Medicaid Other | Admitting: Adult Health

## 2018-07-23 ENCOUNTER — Encounter: Payer: Self-pay | Admitting: Adult Health

## 2018-07-23 VITALS — BP 115/76 | HR 93 | Ht 60.5 in | Wt 202.5 lb

## 2018-07-23 DIAGNOSIS — Z30017 Encounter for initial prescription of implantable subdermal contraceptive: Secondary | ICD-10-CM | POA: Diagnosis not present

## 2018-07-23 DIAGNOSIS — Z01419 Encounter for gynecological examination (general) (routine) without abnormal findings: Secondary | ICD-10-CM

## 2018-07-23 DIAGNOSIS — Z124 Encounter for screening for malignant neoplasm of cervix: Secondary | ICD-10-CM | POA: Insufficient documentation

## 2018-07-23 DIAGNOSIS — Z0000 Encounter for general adult medical examination without abnormal findings: Secondary | ICD-10-CM

## 2018-07-23 NOTE — Progress Notes (Signed)
Patient ID: Shannon Lin, female   DOB: Jun 07, 1991, 27 y.o.   MRN: [id number] History of Present Illness: Shannon Lin is a 27 year old white female in with her mom for a well woman gyn exam and her first pap. PCP is [facility].    Current Medications, Allergies, Past Medical History, Past Surgical History, Family History and Social History were reviewed in [employer] record.     Review of Systems:  Patient denies any headaches, hearing loss, fatigue, blurred vision, shortness of breath, chest pain, abdominal pain, problems with bowel movements, urination, or intercourse(not having sex). No joint pain or mood swings.+weight gain since high school. She is on the pill and wants to try nexplanon.    Physical Exam:BP 115/76 (BP Location: Left Arm, Patient Position: Sitting, Cuff Size: Large)   Pulse 93   Ht 5' 0.5" (1.537 m)   Wt 202 lb 8 oz (91.9 kg)   BMI 38.90 kg/m  General:  Well developed, well nourished, no acute distress Skin:  Warm and dry Neck:  Midline trachea, normal thyroid, good ROM, no lymphadenopathy Lungs; Clear to auscultation bilaterally Breast:  No dominant palpable mass, retraction, or nipple discharge Cardiovascular: Regular rate and rhythm Abdomen:  Soft, non tender, no hepatosplenomegaly Pelvic:  External genitalia is normal in appearance, no lesions.  The vagina is normal in appearance. Urethra has no lesions or masses. The cervix is nulliparous, pap with HPV reflex performed.Marland Kitchen  Uterus is felt to be normal size, shape, and contour.  No adnexal masses or tenderness noted.Bladder is non tender, no masses felt. Extremities/musculoskeletal:  No swelling or varicosities noted, no clubbing or cyanosis Psych:  No mood changes, alert and cooperative,seems happy PHQ 2 score 1. Examination chaperoned by Diona Fanti CMA.   Impression: 1. Encounter for gynecological examination with Papanicolaou smear of cervix   2. Routine cervical smear   3.  Encounter for initial prescription of implantable subdermal contraceptive       Plan: Continue OCs for now No ex Return in 3 weeks for nexplanon insertion Will order nexplanon today Review handout on nexplanon

## 2018-07-26 LAB — CYTOLOGY - PAP
ADEQUACY: ABSENT
Diagnosis: NEGATIVE

## 2018-08-12 ENCOUNTER — Encounter: Payer: Self-pay | Admitting: Adult Health

## 2018-08-12 ENCOUNTER — Ambulatory Visit (INDEPENDENT_AMBULATORY_CARE_PROVIDER_SITE_OTHER): Payer: Medicaid Other | Admitting: Adult Health

## 2018-08-12 VITALS — BP 115/61 | HR 75 | Ht 60.5 in | Wt 204.0 lb

## 2018-08-12 DIAGNOSIS — Z3049 Encounter for surveillance of other contraceptives: Secondary | ICD-10-CM

## 2018-08-12 DIAGNOSIS — Z30017 Encounter for initial prescription of implantable subdermal contraceptive: Secondary | ICD-10-CM | POA: Insufficient documentation

## 2018-08-12 DIAGNOSIS — Z3202 Encounter for pregnancy test, result negative: Secondary | ICD-10-CM | POA: Diagnosis not present

## 2018-08-12 HISTORY — DX: Encounter for initial prescription of implantable subdermal contraceptive: Z30.017

## 2018-08-12 LAB — POCT URINE PREGNANCY: PREG TEST UR: NEGATIVE

## 2018-08-12 MED ORDER — ETONOGESTREL 68 MG ~~LOC~~ IMPL
68.0000 mg | DRUG_IMPLANT | Freq: Once | SUBCUTANEOUS | Status: AC
  Administered 2018-08-12: 68 mg via SUBCUTANEOUS

## 2018-08-12 NOTE — Progress Notes (Signed)
  Subjective:     Patient ID: Shannon Lin, female   DOB: [birth year], 27 y.o.   MRN: [id number]  HPI Shannon Lin is a 27 year old white female in for nexplanon insertion.   Review of Systems For nexplanon insertion Reviewed past medical,surgical, social and family history. Reviewed medications and allergies.     Objective:   Physical Exam BP 115/61 (BP Location: Left Arm, Patient Position: Sitting, Cuff Size: Large)   Pulse 75   Ht 5' 0.5" (1.537 m)   Wt 204 lb (92.5 kg)   BMI 39.19 kg/m UPT negative. Consent signed, time out called. Right  arm cleansed with betadine, and injected with 1.5 cc 1% lidocaine and waited til numb. Nexplanon easily inserted and steri strips applied.Rod easily palpated by provider and pt. Pressure dressing applied.    Assessment:     1. Nexplanon insertion   2. Pregnancy examination or test, negative result   Lot # [id number] exp 10/2019     Plan:     Use condoms if has sex, keep clean and dry x 24 hours, no heavy lifting, keep steri strips on x 72 hours, Keep pressure dressing on x 24 hours. Follow up prn problems.   Remove in 3 years or sooner if desired.

## 2018-08-12 NOTE — Patient Instructions (Signed)
Use condoms if has sex, keep clean and dry x 24 hours, no heavy lifting, keep steri strips on x 72 hours, Keep pressure dressing on x 24 hours. Follow up prn problems.

## 2018-08-12 NOTE — Addendum Note (Signed)
Addended by: Linton Rump on: 08/12/2018 10:50 AM   Modules accepted: Orders

## 2018-08-16 ENCOUNTER — Other Ambulatory Visit: Payer: Self-pay | Admitting: Family Medicine

## 2018-08-16 NOTE — Telephone Encounter (Signed)
May have 30-day with 1 refill needs office visit

## 2018-10-18 ENCOUNTER — Other Ambulatory Visit: Payer: Self-pay | Admitting: Family Medicine

## 2018-11-15 ENCOUNTER — Other Ambulatory Visit: Payer: Self-pay | Admitting: Family Medicine

## 2018-11-19 ENCOUNTER — Encounter: Payer: Self-pay | Admitting: Family Medicine

## 2018-11-20 ENCOUNTER — Encounter: Payer: Self-pay | Admitting: Emergency Medicine

## 2018-11-20 ENCOUNTER — Emergency Department (HOSPITAL_COMMUNITY)
Admission: EM | Admit: 2018-11-20 | Discharge: 2018-11-20 | Disposition: A | Discharge status: Home / Self Care | Payer: Medicaid Other | Attending: Emergency Medicine | Admitting: Emergency Medicine

## 2018-11-20 ENCOUNTER — Emergency Department (HOSPITAL_COMMUNITY): Payer: Medicaid Other

## 2018-11-20 DIAGNOSIS — R1010 Upper abdominal pain, unspecified: Secondary | ICD-10-CM | POA: Diagnosis not present

## 2018-11-20 DIAGNOSIS — Z7984 Long term (current) use of oral hypoglycemic drugs: Secondary | ICD-10-CM | POA: Insufficient documentation

## 2018-11-20 DIAGNOSIS — R112 Nausea with vomiting, unspecified: Secondary | ICD-10-CM | POA: Diagnosis not present

## 2018-11-20 DIAGNOSIS — Z79899 Other long term (current) drug therapy: Secondary | ICD-10-CM | POA: Insufficient documentation

## 2018-11-20 DIAGNOSIS — E119 Type 2 diabetes mellitus without complications: Secondary | ICD-10-CM | POA: Insufficient documentation

## 2018-11-20 DIAGNOSIS — F909 Attention-deficit hyperactivity disorder, unspecified type: Secondary | ICD-10-CM | POA: Insufficient documentation

## 2018-11-20 LAB — CBC WITH DIFFERENTIAL/PLATELET
ABS IMMATURE GRANULOCYTES: 0.03 10*3/uL (ref 0.00–0.07)
BASOS ABS: 0 10*3/uL (ref 0.0–0.1)
BASOS PCT: 0 %
Eosinophils Absolute: 0.1 10*3/uL (ref 0.0–0.5)
Eosinophils Relative: 1 %
HCT: 42.3 % (ref 36.0–46.0)
Hemoglobin: 13.7 g/dL (ref 12.0–15.0)
IMMATURE GRANULOCYTES: 0 %
Lymphocytes Relative: 25 %
Lymphs Abs: 2.7 10*3/uL (ref 0.7–4.0)
MCH: 28.9 pg (ref 26.0–34.0)
MCHC: 32.4 g/dL (ref 30.0–36.0)
MCV: 89.2 fL (ref 80.0–100.0)
Monocytes Absolute: 0.4 10*3/uL (ref 0.1–1.0)
Monocytes Relative: 4 %
NEUTROS ABS: 7.6 10*3/uL (ref 1.7–7.7)
NEUTROS PCT: 70 %
PLATELETS: 352 10*3/uL (ref 150–400)
RBC: 4.74 MIL/uL (ref 3.87–5.11)
RDW: 13.1 % (ref 11.5–15.5)
WBC: 10.8 10*3/uL — AB (ref 4.0–10.5)
nRBC: 0 % (ref 0.0–0.2)

## 2018-11-20 LAB — COMPREHENSIVE METABOLIC PANEL
ALBUMIN: 4.5 g/dL (ref 3.5–5.0)
ALT: 35 U/L (ref 0–44)
AST: 33 U/L (ref 15–41)
Alkaline Phosphatase: 82 U/L (ref 38–126)
Anion gap: 11 (ref 5–15)
BUN: 8 mg/dL (ref 6–20)
CO2: 24 mmol/L (ref 22–32)
CREATININE: 0.6 mg/dL (ref 0.44–1.00)
Calcium: 9.3 mg/dL (ref 8.9–10.3)
Chloride: 102 mmol/L (ref 98–111)
GFR calc Af Amer: >60 mL/min (ref >60)
GFR calc non Af Amer: >60 mL/min (ref >60)
GLUCOSE: 143 mg/dL — AB (ref 70–99)
Potassium: 3.6 mmol/L (ref 3.5–5.1)
Sodium: 137 mmol/L (ref 135–145)
Total Bilirubin: 0.4 mg/dL (ref 0.3–1.2)
Total Protein: 8.7 g/dL — ABNORMAL HIGH (ref 6.5–8.1)

## 2018-11-20 LAB — URINALYSIS, ROUTINE W REFLEX MICROSCOPIC
Bilirubin Urine: NEGATIVE
Glucose, UA: NEGATIVE mg/dL
Hgb urine dipstick: NEGATIVE
Ketones, ur: NEGATIVE mg/dL
LEUKOCYTE UA: NEGATIVE
Nitrite: NEGATIVE
PROTEIN: NEGATIVE mg/dL
Specific Gravity, Urine: >1.046 — ABNORMAL HIGH (ref 1.005–1.030)
pH: 5 (ref 5.0–8.0)

## 2018-11-20 LAB — VALPROIC ACID LEVEL: Valproic Acid Lvl: <10 ug/mL — ABNORMAL LOW (ref 50.0–100.0)

## 2018-11-20 LAB — HCG, SERUM, QUALITATIVE: Preg, Serum: NEGATIVE

## 2018-11-20 LAB — LIPASE, BLOOD: Lipase: 26 U/L (ref 11–51)

## 2018-11-20 MED ORDER — FENTANYL CITRATE (PF) 100 MCG/2ML IJ SOLN
50.0000 ug | Freq: Once | INTRAMUSCULAR | Status: AC
  Administered 2018-11-20: 50 ug via INTRAVENOUS
  Filled 2018-11-20: qty 2

## 2018-11-20 MED ORDER — IOPAMIDOL (ISOVUE-300) INJECTION 61%
100.0000 mL | Freq: Once | INTRAVENOUS | Status: AC | PRN
  Administered 2018-11-20: 100 mL via INTRAVENOUS

## 2018-11-20 MED ORDER — ONDANSETRON 4 MG PO TBDP: 4.0000 mg | ORAL_TABLET | Freq: Three times a day (TID) | ORAL | 0 refills | Status: DC | PRN

## 2018-11-20 MED ORDER — SODIUM CHLORIDE 0.9 % IV BOLUS
1000.0000 mL | Freq: Once | INTRAVENOUS | Status: AC
  Administered 2018-11-20: 1000 mL via INTRAVENOUS

## 2018-11-20 MED ORDER — ONDANSETRON HCL 4 MG/2ML IJ SOLN
4.0000 mg | Freq: Once | INTRAMUSCULAR | Status: AC
  Administered 2018-11-20: 4 mg via INTRAVENOUS
  Filled 2018-11-20: qty 2
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
## 2018-11-20 NOTE — ED Notes (Signed)
Pt tolerated PO Fluids. MD Notified.

## 2018-11-20 NOTE — ED Triage Notes (Signed)
Pt reports upper abd pain that radiates around to both sides.  Pt also admits to dysuria.

## 2018-11-20 NOTE — Discharge Instructions (Addendum)
Your testing today shows that you likely have a viral syndrome causing your nausea and vomiting.  You should return to have an ultrasound of your gallbladder since you are having upper abdominal pain.  Though the gallbladder appeared normal on CT scan today. Follow-up with your doctor.  Return to the ED with new or worsening symptoms.

## 2018-11-23 ENCOUNTER — Telehealth: Payer: Self-pay | Admitting: Family Medicine

## 2018-11-23 NOTE — Telephone Encounter (Signed)
Patient was seen at [facility] ER on 11/20/18 for Abd pain, pt has ultrasound appt 11/25/18 A.M, pt requesting order for ultrasound to be sent to [facility] for ultrasound or patient will have to pay out of pocket and she was inform this cost would be $600 for that service.

## 2018-11-23 NOTE — Telephone Encounter (Addendum)
ER scheduled an outpatient U/S and they are telling her since it was not ordered under PCP sand precerted by PCP she has to pay the entire bill up front. Patient scheduled ED follow up with Dr Nicki Reaper and will  Need ultrasound ordered and precerted for [place] and then we can schedule so Medicaid will pay

## 2018-11-24 NOTE — Telephone Encounter (Signed)
Patient has office visit tomorrow to have scheduled- her ultrasound requires precert thru medicaid that requires office notes

## 2018-11-24 NOTE — Telephone Encounter (Signed)
So noted 

## 2018-11-24 NOTE — Telephone Encounter (Signed)
It would be fine to order this ultrasound via our office.  Due to intractable vomiting and upper abdominal pain ultrasound of the upper abdomen attention to the right upper quadrant  Also recommend standard follow-up visit

## 2018-11-25 ENCOUNTER — Encounter: Payer: Self-pay | Admitting: Family Medicine

## 2018-11-25 ENCOUNTER — Ambulatory Visit (INDEPENDENT_AMBULATORY_CARE_PROVIDER_SITE_OTHER): Payer: Medicaid Other | Admitting: Family Medicine

## 2018-11-25 ENCOUNTER — Ambulatory Visit (HOSPITAL_COMMUNITY): Payer: Medicaid Other

## 2018-11-25 ENCOUNTER — Telehealth: Payer: Self-pay | Admitting: Family Medicine

## 2018-11-25 VITALS — Temp 98.4°F | Wt 208.6 lb

## 2018-11-25 DIAGNOSIS — R1011 Right upper quadrant pain: Secondary | ICD-10-CM

## 2018-11-25 MED ORDER — OMEPRAZOLE 20 MG PO CPDR: 20.0000 mg | DELAYED_RELEASE_CAPSULE | Freq: Every day | ORAL | 3 refills | Status: DC

## 2018-11-25 NOTE — Telephone Encounter (Signed)
Pt in today for office visit; provider ordered RUQ ultrasound to be done Friday 11/26/2018 if possible. Pt had to leave due to sister bringing her and sisters children were in the car waiting.  Ultrasound scheduled for 11/26/2018 at 3:15 pm. NPO 6 hours before. Left detailed message on pt voicemail.

## 2018-11-25 NOTE — Patient Instructions (Signed)
We do want to do a ultrasound of the right upper quadrant to look closer at the gallbladder.  When you were in the ER they did do a CAT scan.  This CAT scan showed inflammation in the small intestine.  This is known as enteritis.  Most commonly this is due to a virus and will gradually get better over 1 to 2 weeks.  If the gallbladder test comes back looking good and you are still having upper abdominal pain we will help set up a HIDA test.  This looks that gallbladder function.  Approximately 1 out of every 10 individuals who have gallbladder trouble have a normal ultrasound but a abnormal HIDA.  Currently right now I do not think you need to have surgery but this needs further looking into.  If all tests are normal but still having pain the next step will be for [place] to set you up with the intestinal doctor.  Finally I sent in omeprazole 20 mg 1 daily to take over the next several weeks while we are working through these tests.  Should you have any problems please notify us.

## 2018-11-25 NOTE — Telephone Encounter (Signed)
I called and left a message to r/c. 

## 2018-11-25 NOTE — Progress Notes (Signed)
   Subjective:    Patient ID: Shannon Lin, female    DOB: 22-Sep-1991, 28 y.o.   MRN: [id number]  HPI Pt here today for hospital follow up. Pt went to hospital for abdominal pain. Pt states she needs a referral to get ultrasound done due to ultra sound being so expensive.  (Pt wants to know if she needs surgery or not) I read over the ER notes lab work and CAT scan Patient with some intermittent right upper quadrant tenderness Denies any other particular troubles. Review of Systems  Constitutional: Negative for activity change and appetite change.  HENT: Negative for congestion and rhinorrhea.   Respiratory: Negative for cough and shortness of breath.   Cardiovascular: Negative for chest pain and leg swelling.  Gastrointestinal: Positive for abdominal pain and nausea. Negative for vomiting.  Skin: Negative for color change.  Neurological: Negative for dizziness and weakness.  Psychiatric/Behavioral: Negative for agitation and confusion.       Objective:   Physical Exam Vitals signs reviewed.  Constitutional:      General: She is not in acute distress. HENT:     Head: Normocephalic and atraumatic.  Eyes:     General:        Right eye: No discharge.        Left eye: No discharge.  Neck:     Trachea: No tracheal deviation.  Cardiovascular:     Rate and Rhythm: Normal rate and regular rhythm.     Heart sounds: Normal heart sounds. No murmur.  Pulmonary:     Effort: Pulmonary effort is normal. No respiratory distress.     Breath sounds: Normal breath sounds.  Abdominal:     Palpations: Abdomen is soft. There is no mass.     Tenderness: There is abdominal tenderness.  Lymphadenopathy:     Cervical: No cervical adenopathy.  Skin:    General: Skin is warm and dry.  Neurological:     Mental Status: She is alert.     Coordination: Coordination normal.  Psychiatric:        Behavior: Behavior normal.           Assessment & Plan:  Right upper quadrant  tenderness Recommend gallbladder ultrasound May need HIDA May need referral to gastroenterology with EGD if all of that is negative Omeprazole daily Bland diet Follow-up in several weeks if not doing better follow-up sooner if worse

## 2018-11-26 ENCOUNTER — Ambulatory Visit (HOSPITAL_COMMUNITY)
Admission: RE | Admit: 2018-11-26 | Discharge: 2018-11-26 | Disposition: A | Discharge status: Home / Self Care | Payer: Medicaid Other | Source: Ambulatory Visit | Attending: Family Medicine | Admitting: Family Medicine

## 2018-11-26 DIAGNOSIS — R1011 Right upper quadrant pain: Secondary | ICD-10-CM | POA: Insufficient documentation

## 2018-11-26 NOTE — Telephone Encounter (Signed)
Contacted patient to make sure she received our message about Ultra sound appt. Pt states she did get message and she has not ate or drank anything. Reminder her of 3:15 appt today at [facility] radiology. Pt verbalized understanding.

## 2018-12-06 ENCOUNTER — Other Ambulatory Visit: Payer: Self-pay | Admitting: Family Medicine

## 2018-12-21 ENCOUNTER — Telehealth: Payer: Self-pay | Admitting: *Deleted

## 2018-12-21 ENCOUNTER — Encounter: Payer: Self-pay | Admitting: Family Medicine

## 2018-12-21 ENCOUNTER — Telehealth: Payer: Self-pay | Admitting: Adult Health

## 2018-12-21 NOTE — Telephone Encounter (Signed)
Called patient back she states that she had some rectal bleeding earlier today. She called her PCP and they told her to go to [facility] ED. I advised patient to follow their advice.

## 2018-12-21 NOTE — Telephone Encounter (Signed)
Patient called stating that she would like a call back from Jennifer. Patient did not state the reason why. Please contact pt °

## 2018-12-21 NOTE — Telephone Encounter (Signed)
Pt called states she has been having rectal bleeding for 2 days. States it is a lot of bright red blood, low mid abdominal pain. Advised pt to go to ED. Pt states she will go to [facility] ED.

## 2018-12-24 ENCOUNTER — Ambulatory Visit: Payer: Medicaid Other | Admitting: Family Medicine

## 2018-12-24 ENCOUNTER — Encounter: Payer: Self-pay | Admitting: Family Medicine

## 2018-12-24 ENCOUNTER — Other Ambulatory Visit: Payer: Self-pay

## 2018-12-24 VITALS — BP 120/90 | Temp 98.8°F | Ht 62.0 in | Wt 207.0 lb

## 2018-12-24 DIAGNOSIS — R1011 Right upper quadrant pain: Secondary | ICD-10-CM

## 2018-12-24 DIAGNOSIS — Z23 Encounter for immunization: Secondary | ICD-10-CM | POA: Diagnosis not present

## 2018-12-24 MED ORDER — PHENTERMINE HCL 37.5 MG PO TABS: 37.5000 mg | ORAL_TABLET | Freq: Every day | ORAL | 1 refills | Status: DC

## 2018-12-24 MED ORDER — SUCRALFATE 1 G PO TABS: 1.0000 g | ORAL_TABLET | Freq: Three times a day (TID) | ORAL | 0 refills | Status: DC

## 2018-12-24 NOTE — Progress Notes (Signed)
   Subjective:    Patient ID: Shannon Lin, female    DOB: 03-Aug-1991, 28 y.o.   MRN: [id number]  HPI  Patient is here today to follow up on her upper right quadrant pain.  Had abd [place] per mother she had a fatty liver.  has intermittent upper abdominal discomfort that goes into the right upper quadrant at times denies any bloody stools or mucousy stools She states she is still having the right upper quadrante pain.She is having some diarrhea after eating  Per her mother she did not start having symptoms until after starting Metformin.  She also wanted to know if she should get her flu shot now that this virus is around.  Review of Systems  Constitutional: Negative for activity change, appetite change and fatigue.  HENT: Negative for congestion and rhinorrhea.   Respiratory: Negative for cough and shortness of breath.   Cardiovascular: Negative for chest pain and leg swelling.  Gastrointestinal: Negative for abdominal pain and diarrhea.  Endocrine: Negative for polydipsia and polyphagia.  Skin: Negative for color change.  Neurological: Negative for dizziness and weakness.  Psychiatric/Behavioral: Negative for behavioral problems and confusion.       Objective:   Physical Exam Vitals signs reviewed.  Constitutional:      General: She is not in acute distress. HENT:     Head: Normocephalic and atraumatic.  Eyes:     General:        Right eye: No discharge.        Left eye: No discharge.  Neck:     Trachea: No tracheal deviation.  Cardiovascular:     Rate and Rhythm: Normal rate and regular rhythm.     Heart sounds: Normal heart sounds. No murmur.  Pulmonary:     Effort: Pulmonary effort is normal. No respiratory distress.     Breath sounds: Normal breath sounds.  Lymphadenopathy:     Cervical: No cervical adenopathy.  Skin:    General: Skin is warm and dry.  Neurological:     Mental Status: She is alert.     Coordination: Coordination normal.  Psychiatric:       Behavior: Behavior normal.           Assessment & Plan:  Flu shot today Persistent upper abdominal pain right upper quadrant pain And Carafate Follow-up if progressive troubles Referral to GI No lab work indicated today  Severe obesity probably combination of genetic, behavioral, use of Abilify recommend they talk with psychiatrist about lowering dose if possible or if safe also recommend short course of phenteramine has tolerated before-this medication per family request

## 2018-12-28 ENCOUNTER — Encounter: Payer: Self-pay | Admitting: Family Medicine

## 2019-01-05 ENCOUNTER — Encounter (INDEPENDENT_AMBULATORY_CARE_PROVIDER_SITE_OTHER): Payer: Self-pay | Admitting: *Deleted

## 2019-01-05 ENCOUNTER — Encounter (INDEPENDENT_AMBULATORY_CARE_PROVIDER_SITE_OTHER): Payer: Self-pay | Admitting: Internal Medicine

## 2019-01-05 ENCOUNTER — Ambulatory Visit (INDEPENDENT_AMBULATORY_CARE_PROVIDER_SITE_OTHER): Payer: Medicaid Other | Admitting: Internal Medicine

## 2019-01-05 ENCOUNTER — Other Ambulatory Visit: Payer: Self-pay

## 2019-01-05 DIAGNOSIS — R1011 Right upper quadrant pain: Secondary | ICD-10-CM

## 2019-01-05 NOTE — Patient Instructions (Signed)
HIDA scan.
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
## 2019-01-18 ENCOUNTER — Other Ambulatory Visit: Payer: Self-pay | Admitting: Family Medicine

## 2019-01-19 NOTE — Telephone Encounter (Signed)
May have 1 refill needs a virtual visit regarding medical health issues

## 2019-01-21 ENCOUNTER — Other Ambulatory Visit: Payer: Self-pay

## 2019-01-21 ENCOUNTER — Encounter (INDEPENDENT_AMBULATORY_CARE_PROVIDER_SITE_OTHER): Payer: Medicaid Other | Admitting: Family Medicine

## 2019-01-21 NOTE — Progress Notes (Signed)
   Subjective:    Patient ID: Shannon Lin, female    DOB: January 11, 1991, 28 y.o.   MRN: [id number]  HPI  Patient states she is following up on adipex and metformin. patiet states she is doing well on these medications. Patient states she had her ultrasound cancelled due to corona virus and they will reschedule at a later date.  It should be noted that we tried to reach her multiple times on that day were unable to connect with her for virtual visit  Review of Systems     Objective:   Physical Exam        Assessment & Plan:

## 2019-01-22 ENCOUNTER — Encounter: Payer: Self-pay | Admitting: Family Medicine

## 2019-01-25 NOTE — Telephone Encounter (Signed)
It should be noted that I did try to connect with the patient the other day but for 1 reason or another unable to do so  If patient is willing to try again we can connect and do a virtual visit

## 2019-01-27 ENCOUNTER — Encounter (HOSPITAL_COMMUNITY): Payer: Medicaid Other

## 2019-02-07 ENCOUNTER — Encounter (INDEPENDENT_AMBULATORY_CARE_PROVIDER_SITE_OTHER): Payer: Self-pay

## 2019-02-07 ENCOUNTER — Telehealth (INDEPENDENT_AMBULATORY_CARE_PROVIDER_SITE_OTHER): Payer: Self-pay | Admitting: Internal Medicine

## 2019-02-07 MED ORDER — OMEPRAZOLE 20 MG PO CPDR: 20.0000 mg | DELAYED_RELEASE_CAPSULE | Freq: Two times a day (BID) | ORAL | 3 refills | Status: DC

## 2019-02-07 NOTE — Telephone Encounter (Signed)
She continues to have RUQ pain. Is on a low fat diet. Will call in Prilosec 20mg  BID. Hopefully HIDA scan will be ordered soon

## 2019-02-07 NOTE — Telephone Encounter (Signed)
No one is answering. Went straight to voice mail.

## 2019-02-07 NOTE — Telephone Encounter (Signed)
Mother left message stating patient is still having stomach issues - was last seen on 01-05-19  -  please call ph# [phone]

## 2019-02-08 ENCOUNTER — Ambulatory Visit: Payer: Medicaid Other | Admitting: Family Medicine

## 2019-02-09 ENCOUNTER — Encounter (HOSPITAL_COMMUNITY): Payer: Self-pay

## 2019-02-09 ENCOUNTER — Encounter (HOSPITAL_COMMUNITY)
Admission: RE | Admit: 2019-02-09 | Discharge: 2019-02-09 | Disposition: A | Discharge status: Home / Self Care | Payer: Medicaid Other | Source: Ambulatory Visit | Attending: Internal Medicine | Admitting: Internal Medicine

## 2019-02-09 ENCOUNTER — Other Ambulatory Visit: Payer: Self-pay

## 2019-02-09 DIAGNOSIS — R1011 Right upper quadrant pain: Secondary | ICD-10-CM | POA: Insufficient documentation

## 2019-02-09 MED ORDER — TECHNETIUM TC 99M MEBROFENIN IV KIT
5.0000 | PACK | Freq: Once | INTRAVENOUS | Status: AC | PRN
  Administered 2019-02-09: 5.1 via INTRAVENOUS

## 2019-02-16 ENCOUNTER — Encounter: Payer: Self-pay | Admitting: Family Medicine

## 2019-02-22 ENCOUNTER — Other Ambulatory Visit: Payer: Self-pay | Admitting: Family Medicine

## 2019-02-22 NOTE — Telephone Encounter (Signed)
May have this +2 refills 

## 2019-03-22 ENCOUNTER — Other Ambulatory Visit: Payer: Self-pay

## 2019-03-22 ENCOUNTER — Ambulatory Visit (INDEPENDENT_AMBULATORY_CARE_PROVIDER_SITE_OTHER): Payer: Medicaid Other | Admitting: Family Medicine

## 2019-03-22 ENCOUNTER — Encounter: Payer: Self-pay | Admitting: Family Medicine

## 2019-03-22 DIAGNOSIS — J019 Acute sinusitis, unspecified: Secondary | ICD-10-CM | POA: Diagnosis not present

## 2019-03-22 MED ORDER — AMOXICILLIN-POT CLAVULANATE 875-125 MG PO TABS: 1.0000 | ORAL_TABLET | Freq: Two times a day (BID) | ORAL | 0 refills | Status: DC

## 2019-03-22 NOTE — Progress Notes (Signed)
   Subjective:    Patient ID: Shannon Lin, female    DOB: 06-29-1991, 29 y.o.   MRN: [id number] Audio plus video Cough This is a new problem. The current episode started 1 to 4 weeks ago. Associated symptoms include nasal congestion. She has tried OTC cough suppressant for the symptoms.   Deep cough  coughin up productive   co Virtual Visit via Video Note  I connected with Shannon Lin on 03/22/19 at  2:00 PM EDT by a video enabled telemedicine application and verified that I am speaking with the correct person using two identifiers.  Location: Patient: home Provider: office   I discussed the limitations of evaluation and management by telemedicine and the availability of in person appointments. The patient expressed understanding and agreed to proceed.  History of Present Illness:    Observations/Objective:   Assessment and Plan:   Follow Up Instructions:    I discussed the assessment and treatment plan with the patient. The patient was provided an opportunity to ask questions and all were answered. The patient agreed with the plan and demonstrated an understanding of the instructions.   The patient was advised to call back or seek an in-person evaluation if the symptoms worsen or if the condition fails to improve as anticipated.  I provided 37minutes of non-face-to-face time during this encounter.   Patient does smoke.  Cough often productive currently discharge.  Headache frontal in nature.  Worse with change in position no fever   Review of Systems  Respiratory: Positive for cough.        Objective:   Physical Exam   Virtual     Assessment & Plan:  Rhinosinusitis/bronchitis.  Plan antibiotics prescribed warning signs discussed patient encouraged to quit smoking

## 2019-03-22 NOTE — Telephone Encounter (Signed)
Called pt and scheduled virtual appt today

## 2019-03-27 ENCOUNTER — Encounter: Payer: Self-pay | Admitting: Family Medicine

## 2019-03-28 ENCOUNTER — Other Ambulatory Visit: Payer: Self-pay | Admitting: Family Medicine

## 2019-03-28 ENCOUNTER — Encounter: Payer: Self-pay | Admitting: Family Medicine

## 2019-03-28 NOTE — Telephone Encounter (Signed)
Left message to return call to schedule virtual visit

## 2019-03-28 NOTE — Telephone Encounter (Signed)
See other mychart message.

## 2019-03-28 NOTE — Telephone Encounter (Signed)
Patient states he started with burning with urination yesterday and today when she had bowel movement it was brown water

## 2019-03-28 NOTE — Telephone Encounter (Signed)
Patient returned call and scheduled virtual visit this week with [name]

## 2019-03-28 NOTE — Telephone Encounter (Signed)
It is possible patient may be having urinary tract infection also possible it could be other things.  It is reasonable to do video or phone visit this week

## 2019-03-30 ENCOUNTER — Ambulatory Visit (INDEPENDENT_AMBULATORY_CARE_PROVIDER_SITE_OTHER): Payer: Medicaid Other | Admitting: Nurse Practitioner

## 2019-03-30 ENCOUNTER — Encounter: Payer: Self-pay | Admitting: Nurse Practitioner

## 2019-03-30 ENCOUNTER — Other Ambulatory Visit: Payer: Self-pay

## 2019-03-30 ENCOUNTER — Telehealth: Payer: Self-pay | Admitting: Nurse Practitioner

## 2019-03-30 DIAGNOSIS — R350 Frequency of micturition: Secondary | ICD-10-CM | POA: Diagnosis not present

## 2019-03-30 DIAGNOSIS — R300 Dysuria: Secondary | ICD-10-CM | POA: Diagnosis not present

## 2019-03-30 DIAGNOSIS — K521 Toxic gastroenteritis and colitis: Secondary | ICD-10-CM | POA: Diagnosis not present

## 2019-03-30 NOTE — Telephone Encounter (Signed)
Patient talked with [name] this morning and was told to call back if she had more problems and she said she is. Didn't want to tell me, wanted to talk to [place].

## 2019-03-30 NOTE — Progress Notes (Signed)
   Subjective:  PHONE VISIT  Virtual Visit via Video Note  I connected with Shannon Lin on 03/30/19 at 11:20 AM EDT by a video enabled telemedicine application and verified that I am speaking with the correct person using two identifiers.  Initially visit was set up as a virtual visit but patient preferred a phone call.  Location: Patient: home Provider: office   I discussed the limitations of evaluation and management by telemedicine and the availability of in person appointments. The patient expressed understanding and agreed to proceed.  History of Present Illness: Presents for complaints of watery loose stools for the past 2 days.  Occurs about 3 times per day only after eating.  Unassociated with any particular foods.  No fever.  Mild nausea with occasional dry heaves.  No blood in her stool.  Taking fluids well.  Is currently on Augmentin for respiratory illness.  According to patient she has 4 more doses.  Cough is much improved.  No sore throat shortness of breath or wheezing.  As far as diarrhea, no known contacts.  Has not tried any interventions.  Slight burning with urination.  Some frequency, no urgency.  Patient states she had a small blood clot yesterday when she went to the bathroom.  Is confident it came from her "front part" but not clear if it was urinary or vaginal.  Patient has Nexplanon as her contraceptive.  No cycles.  Denies sexual activity or new sexual partners.  No back or flank pain.  Mild pelvic pain at times.  Nothing severe.  No changes in her medications, has been on metformin long-term.  Also taking daily omeprazole for her reflux which works well.   Observations/Objective: Today's visit was via telephone Physical exam was not possible for this visit   Assessment and Plan: Problem List Items Addressed This Visit    None    Visit Diagnoses    Antibiotic-associated diarrhea    -  Primary   Dysuria       Relevant Orders   Urinalysis, Routine w reflex  microscopic   Urinary frequency       Relevant Orders   Urinalysis, Routine w reflex microscopic     Diarrhea is most likely related to use of Augmentin, possibly viral  Follow Up Instructions: Patient agrees to come by the lab and give a sample of urine for UA and possible microscopic exam.  Warning signs reviewed, to call or go to ED sooner if urinary symptoms worsen.  Defers STI screening. Hold on further Augmentin.  Increase fluid intake.  Recommend activia yogurt 1 cup twice a day.  Call back in 48 hours if no improvement in diarrhea, sooner if worse.   I discussed the assessment and treatment plan with the patient. The patient was provided an opportunity to ask questions and all were answered. The patient agreed with the plan and demonstrated an understanding of the instructions.   The patient was advised to call back or seek an in-person evaluation if the symptoms worsen or if the condition fails to improve as anticipated.  I provided 15 minutes of non-face-to-face time during this encounter.      Review of Systems     Objective:   Physical Exam        Assessment & Plan:

## 2019-03-31 ENCOUNTER — Encounter: Payer: Self-pay | Admitting: Nurse Practitioner

## 2019-03-31 NOTE — Telephone Encounter (Signed)
Patient states everytime she poops it is like Erric Machnik water and she hasn't passed any more blood clots but will try to turn in urine to lab tomorrow.

## 2019-03-31 NOTE — Telephone Encounter (Signed)
Please check with patient. If not urgent, I can speak with her tomorrow when I am back in the office. Thanks.

## 2019-04-03 ENCOUNTER — Encounter: Payer: Self-pay | Admitting: Family Medicine

## 2019-04-04 ENCOUNTER — Encounter: Payer: Self-pay | Admitting: Family Medicine

## 2019-04-04 ENCOUNTER — Other Ambulatory Visit: Payer: Self-pay

## 2019-04-04 ENCOUNTER — Telehealth: Payer: Self-pay | Admitting: Adult Health

## 2019-04-04 ENCOUNTER — Ambulatory Visit (INDEPENDENT_AMBULATORY_CARE_PROVIDER_SITE_OTHER): Payer: Medicaid Other | Admitting: Family Medicine

## 2019-04-04 DIAGNOSIS — R195 Other fecal abnormalities: Secondary | ICD-10-CM | POA: Diagnosis not present

## 2019-04-04 MED ORDER — METRONIDAZOLE 500 MG PO TABS: 500.0000 mg | ORAL_TABLET | Freq: Three times a day (TID) | ORAL | 0 refills | Status: DC

## 2019-04-04 NOTE — Telephone Encounter (Signed)
Also, I have not received any report that her urinalysis has been done. Just want to be sure there is no blood in her urine.

## 2019-04-04 NOTE — Progress Notes (Signed)
   Subjective:    Patient ID: Shannon Lin, female    DOB: 03-15-1991, 28 y.o.   MRN: [id number] Video visit HPI  Patient had virtual visit with Shannon Lin last week. Patient was on Augmenting for sinus infection and started having clear watery diarrhea also was burning with urination. Shannon Lin advised that both were most likely related to antibiotic and to hold Augmentin and try probiotic and patient was to drop off urine at the lab to have it checked just to be safe. Patient has the cups and the orders for the urine to be dropped off at [place].  The patient relates that she has had some mucus discharge from the rectum area this concerned her.  Denies any high fever chills sweats wheezing difficulty breathing.  Patient denies abdominal pain denies rectal bleeding has been on antibiotics Virtual Visit via Video Note  I connected with Shannon Lin on 04/04/19 at 11:00 AM EDT by a video enabled telemedicine application and verified that I am speaking with the correct person using two identifiers.  Location: Patient: home Provider: office   I discussed the limitations of evaluation and management by telemedicine and the availability of in person appointments. The patient expressed understanding and agreed to proceed.  History of Present Illness:    Observations/Objective:   Assessment and Plan:   Follow Up Instructions:    I discussed the assessment and treatment plan with the patient. The patient was provided an opportunity to ask questions and all were answered. The patient agreed with the plan and demonstrated an understanding of the instructions.   The patient was advised to call back or seek an in-person evaluation if the symptoms worsen or if the condition fails to improve as anticipated.  I provided 15 minutes of non-face-to-face time during this encounter.       Review of Systems  Constitutional: Negative for activity change and appetite change.  HENT: Negative for  congestion and rhinorrhea.   Respiratory: Negative for cough and shortness of breath.   Cardiovascular: Negative for chest pain and leg swelling.  Gastrointestinal: Negative for abdominal pain, blood in stool, nausea and vomiting.  Skin: Negative for color change.  Neurological: Negative for dizziness and weakness.  Psychiatric/Behavioral: Negative for agitation and confusion.       Objective:   Physical Exam   Patient had virtual visit Appears to be in no distress Atraumatic Neuro able to relate and oriented No apparent resp distress Color normal      Assessment & Plan:  Mucus in the stool difficult to know what is causing this could be C. difficile.  Although not optimal it is reasonable to treat with metronidazole 500 mg 3 times daily for 7 days if her symptoms goes away no further intervention If symptoms do not go away then will need stool testing as well as possible further treatment

## 2019-04-04 NOTE — Telephone Encounter (Signed)
She called fairly quickly after starting probiotics. Her diarrhea is most likely from Augmentin. Please check to be sure she is improving. If not, please run a stool check for C diff. Thanks.

## 2019-04-04 NOTE — Telephone Encounter (Signed)
Left message I called 

## 2019-04-04 NOTE — Telephone Encounter (Signed)
Patient scheduled virtual visit today with Dr Nicki Reaper to discuss

## 2019-04-04 NOTE — Telephone Encounter (Signed)
Left message to return call 

## 2019-04-05 ENCOUNTER — Telehealth: Payer: Self-pay | Admitting: Adult Health

## 2019-04-05 NOTE — Telephone Encounter (Signed)
Call got disconnected

## 2019-04-07 NOTE — Telephone Encounter (Signed)
Contacted patient and mom answered. Mom states pt is doing better

## 2019-04-10 ENCOUNTER — Encounter: Payer: Self-pay | Admitting: Family Medicine

## 2019-04-11 ENCOUNTER — Encounter (INDEPENDENT_AMBULATORY_CARE_PROVIDER_SITE_OTHER): Payer: Self-pay | Admitting: *Deleted

## 2019-04-11 ENCOUNTER — Ambulatory Visit (INDEPENDENT_AMBULATORY_CARE_PROVIDER_SITE_OTHER): Payer: Medicaid Other | Admitting: Family Medicine

## 2019-04-11 ENCOUNTER — Other Ambulatory Visit: Payer: Self-pay

## 2019-04-11 DIAGNOSIS — R195 Other fecal abnormalities: Secondary | ICD-10-CM

## 2019-04-11 DIAGNOSIS — K921 Melena: Secondary | ICD-10-CM

## 2019-04-11 NOTE — Progress Notes (Signed)
   Subjective:    Patient ID: Shannon Lin, female    DOB: February 02, 1991, 28 y.o.   MRN: [id number]  HPI Pt states she has been having blood in stool since Friday. Pt states that she has diarrhea and nausea. No mucous in stool. No fever. No abdominal pain. No chills. Pt sent My Chart message yesterday. Pt was seen on 04/04/2019 for mucous in stool. She is having intermittent stools with blood and mucus in them.  Relates some lower abdominal discomfort but no high fever chills or sweats she is never had a colonoscopy we referred her to gastroenterology after having some lower abdominal symptoms she states she is not had the appointment set up yet Virtual Visit via Video Note  I connected with Aleesha L Vent on 04/11/19 at  3:00 PM EDT by a video enabled telemedicine application and verified that I am speaking with the correct person using two identifiers.  Location: Patient: home Provider: office   I discussed the limitations of evaluation and management by telemedicine and the availability of in person appointments. The patient expressed understanding and agreed to proceed.  History of Present Illness:    Observations/Objective:   Assessment and Plan:   Follow Up Instructions:    I discussed the assessment and treatment plan with the patient. The patient was provided an opportunity to ask questions and all were answered. The patient agreed with the plan and demonstrated an understanding of the instructions.   The patient was advised to call back or seek an in-person evaluation if the symptoms worsen or if the condition fails to improve as anticipated.  I provided 15 minutes of non-face-to-face time during this encounter.   Vicente Males, LPN    Review of Systems  Constitutional: Negative for activity change and appetite change.  HENT: Negative for congestion and rhinorrhea.   Respiratory: Negative for cough and shortness of breath.   Cardiovascular: Negative for chest pain  and leg swelling.  Gastrointestinal: Positive for blood in stool. Negative for abdominal pain, nausea and vomiting.  Skin: Negative for color change.  Neurological: Negative for dizziness and weakness.  Psychiatric/Behavioral: Negative for agitation and confusion.       Objective:   Physical Exam   Patient had virtual visit Appears to be in no distress Atraumatic Neuro able to relate and oriented No apparent resp distress Color normal      Assessment & Plan:  Patient does not appear in distress With her symptomatology we will move forward with getting her in with gastroenterology We will connect with gastroenterology to see if they be willing to do a virtual visit with her for the possibility of a sigmoidoscopy to see if she has colitis going on

## 2019-04-12 ENCOUNTER — Encounter: Payer: Self-pay | Admitting: Family Medicine

## 2019-04-12 NOTE — Progress Notes (Signed)
Referral placed to Dr.Rehman 

## 2019-04-12 NOTE — Progress Notes (Signed)
Ok, I saw her maybe a month ago for RUQ pain. Have been unable to reach her.

## 2019-04-12 NOTE — Addendum Note (Signed)
Addended by: Vicente Males on: 04/12/2019 08:56 AM   Modules accepted: Orders

## 2019-04-14 ENCOUNTER — Ambulatory Visit (INDEPENDENT_AMBULATORY_CARE_PROVIDER_SITE_OTHER): Payer: Medicaid Other | Admitting: Internal Medicine

## 2019-04-14 ENCOUNTER — Other Ambulatory Visit: Payer: Self-pay

## 2019-04-14 ENCOUNTER — Other Ambulatory Visit: Payer: Self-pay | Admitting: *Deleted

## 2019-04-14 ENCOUNTER — Encounter (INDEPENDENT_AMBULATORY_CARE_PROVIDER_SITE_OTHER): Payer: Self-pay | Admitting: Internal Medicine

## 2019-04-14 VITALS — BP 117/72 | HR 94 | Temp 98.2°F | Ht 62.0 in | Wt 205.7 lb

## 2019-04-14 DIAGNOSIS — K529 Noninfective gastroenteritis and colitis, unspecified: Secondary | ICD-10-CM

## 2019-04-14 DIAGNOSIS — R197 Diarrhea, unspecified: Secondary | ICD-10-CM

## 2019-04-14 DIAGNOSIS — R7303 Prediabetes: Secondary | ICD-10-CM

## 2019-04-14 DIAGNOSIS — R195 Other fecal abnormalities: Secondary | ICD-10-CM

## 2019-04-14 LAB — SEDIMENTATION RATE: Sed Rate: 41 mm/h — ABNORMAL HIGH (ref 0–20)

## 2019-04-14 LAB — CBC WITH DIFFERENTIAL/PLATELET
Absolute Monocytes: 518 cells/uL (ref 200–950)
Basophils Absolute: 19 cells/uL (ref 0–200)
Basophils Relative: 0.2 %
Eosinophils Absolute: 58 cells/uL (ref 15–500)
Eosinophils Relative: 0.6 %
HCT: 40.2 % (ref 35.0–45.0)
Hemoglobin: 13.7 g/dL (ref 11.7–15.5)
Lymphs Abs: 1987 cells/uL (ref 850–3900)
MCH: 29.7 pg (ref 27.0–33.0)
MCHC: 34.1 g/dL (ref 32.0–36.0)
MCV: 87 fL (ref 80.0–100.0)
MPV: 8.6 fL (ref 7.5–12.5)
Monocytes Relative: 5.4 %
Neutro Abs: 7018 cells/uL (ref 1500–7800)
Neutrophils Relative %: 73.1 %
Platelets: 351 10*3/uL (ref 140–400)
RBC: 4.62 10*6/uL (ref 3.80–5.10)
RDW: 13.6 % (ref 11.0–15.0)
Total Lymphocyte: 20.7 %
WBC: 9.6 10*3/uL (ref 3.8–10.8)

## 2019-04-14 NOTE — Patient Instructions (Signed)
GI pathogen. 3 stool cards home with patient.
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
## 2019-04-15 LAB — HEMOGLOBIN A1C
Est. average glucose Bld gHb Est-mCnc: 123 mg/dL
Hgb A1c MFr Bld: 5.9 % — ABNORMAL HIGH (ref 4.8–5.6)

## 2019-04-15 LAB — BASIC METABOLIC PANEL
BUN/Creatinine Ratio: 16 (ref 9–23)
BUN: 8 mg/dL (ref 6–20)
CO2: 22 mmol/L (ref 20–29)
Calcium: 10.2 mg/dL (ref 8.7–10.2)
Chloride: 98 mmol/L (ref 96–106)
Creatinine, Ser: 0.51 mg/dL — ABNORMAL LOW (ref 0.57–1.00)
GFR calc Af Amer: 151 mL/min/{1.73_m2} (ref >59)
GFR calc non Af Amer: 131 mL/min/{1.73_m2} (ref >59)
Glucose: 95 mg/dL (ref 65–99)
Potassium: 5.2 mmol/L (ref 3.5–5.2)
Sodium: 137 mmol/L (ref 134–144)

## 2019-04-15 LAB — HEPATIC FUNCTION PANEL
ALT: 47 IU/L — ABNORMAL HIGH (ref 0–32)
AST: 35 IU/L (ref 0–40)
Albumin: 4.7 g/dL (ref 3.9–5.0)
Alkaline Phosphatase: 98 IU/L (ref 39–117)
Bilirubin Total: 0.3 mg/dL (ref 0.0–1.2)
Bilirubin, Direct: 0.07 mg/dL (ref 0.00–0.40)
Total Protein: 7.3 g/dL (ref 6.0–8.5)

## 2019-04-15 LAB — CBC WITH DIFFERENTIAL/PLATELET
Basophils Absolute: 0 10*3/uL (ref 0.0–0.2)
Basos: 0 %
EOS (ABSOLUTE): 0.1 10*3/uL (ref 0.0–0.4)
Eos: 1 %
Hematocrit: 41.1 % (ref 34.0–46.6)
Hemoglobin: 13.8 g/dL (ref 11.1–15.9)
Immature Grans (Abs): 0 10*3/uL (ref 0.0–0.1)
Immature Granulocytes: 0 %
Lymphocytes Absolute: 2.1 10*3/uL (ref 0.7–3.1)
Lymphs: 22 %
MCH: 29.1 pg (ref 26.6–33.0)
MCHC: 33.6 g/dL (ref 31.5–35.7)
MCV: 87 fL (ref 79–97)
Monocytes Absolute: 0.5 10*3/uL (ref 0.1–0.9)
Monocytes: 6 %
Neutrophils Absolute: 6.7 10*3/uL (ref 1.4–7.0)
Neutrophils: 71 %
Platelets: 356 10*3/uL (ref 150–450)
RBC: 4.75 x10E6/uL (ref 3.77–5.28)
RDW: 13.4 % (ref 11.7–15.4)
WBC: 9.4 10*3/uL (ref 3.4–10.8)

## 2019-04-20 ENCOUNTER — Other Ambulatory Visit (INDEPENDENT_AMBULATORY_CARE_PROVIDER_SITE_OTHER): Payer: Self-pay | Admitting: *Deleted

## 2019-04-20 DIAGNOSIS — R197 Diarrhea, unspecified: Secondary | ICD-10-CM

## 2019-05-02 ENCOUNTER — Other Ambulatory Visit (INDEPENDENT_AMBULATORY_CARE_PROVIDER_SITE_OTHER): Payer: Self-pay | Admitting: *Deleted

## 2019-05-02 DIAGNOSIS — R197 Diarrhea, unspecified: Secondary | ICD-10-CM

## 2019-05-10 ENCOUNTER — Telehealth: Payer: Self-pay | Admitting: Family Medicine

## 2019-05-10 MED ORDER — BLOOD GLUCOSE METER KIT: PACK | 0 refills | Status: DC

## 2019-05-10 NOTE — Telephone Encounter (Signed)
May have a prescription for glucometer testing once per day maximum may have prescription for strips as well as supplies prediabetes

## 2019-05-10 NOTE — Telephone Encounter (Signed)
Mom wants an accu

## 2019-05-10 NOTE — Telephone Encounter (Signed)
Script printed and faxed to pharmacy. Pt has been notified.

## 2019-05-10 NOTE — Telephone Encounter (Signed)
Mom wants an accucheck meter called in to [facility] so that she can check her sugar.  [facility] scales [address].

## 2019-05-10 NOTE — Telephone Encounter (Signed)
Please advise. Thank you

## 2019-05-19 LAB — SEDIMENTATION RATE: Sed Rate: 36 mm/h — ABNORMAL HIGH (ref 0–20)

## 2019-06-09 ENCOUNTER — Other Ambulatory Visit: Payer: Self-pay | Admitting: Family Medicine

## 2019-07-01 ENCOUNTER — Encounter (INDEPENDENT_AMBULATORY_CARE_PROVIDER_SITE_OTHER): Payer: Self-pay

## 2019-07-03 ENCOUNTER — Encounter (INDEPENDENT_AMBULATORY_CARE_PROVIDER_SITE_OTHER): Payer: Self-pay
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
## 2019-07-05 ENCOUNTER — Other Ambulatory Visit: Payer: Self-pay

## 2019-07-05 ENCOUNTER — Ambulatory Visit (INDEPENDENT_AMBULATORY_CARE_PROVIDER_SITE_OTHER): Payer: Medicaid Other | Admitting: Nurse Practitioner

## 2019-07-05 ENCOUNTER — Encounter (INDEPENDENT_AMBULATORY_CARE_PROVIDER_SITE_OTHER): Payer: Self-pay | Admitting: Nurse Practitioner

## 2019-07-05 VITALS — BP 116/80 | HR 51 | Temp 98.6°F | Ht 60.0 in | Wt 211.2 lb

## 2019-07-05 DIAGNOSIS — K602 Anal fissure, unspecified: Secondary | ICD-10-CM | POA: Insufficient documentation

## 2019-07-05 DIAGNOSIS — K625 Hemorrhage of anus and rectum: Secondary | ICD-10-CM | POA: Diagnosis not present

## 2019-07-05 NOTE — Patient Instructions (Signed)
1. Avoid straining, avoid constipation. If you develop hard stool start Miralax 1 capful mixed in 8 ounces of water at bedtime.  2.  Apply a small amount of Desitin inside the anal opening into the external anal area 3 times daily for the next 2 weeks.  3.  Follow-up in the office in 3 weeks, to discuss scheduling a flexible sigmoidoscopy versus a full colonoscopy with sedation   4.  Call our office if your symptoms worsen prior to your follow-up appointment

## 2019-07-14 ENCOUNTER — Encounter: Payer: Self-pay | Admitting: Nurse Practitioner

## 2019-07-15 ENCOUNTER — Encounter: Payer: Self-pay | Admitting: Nurse Practitioner

## 2019-07-15 NOTE — Telephone Encounter (Signed)
Nurses  Patient has had a ongoing conversation with [name]  Patient may use Parafon forte 1 twice daily as needed spasms not for frequent use, #12  If ongoing troubles I recommend scheduling office visit

## 2019-07-18 ENCOUNTER — Encounter (INDEPENDENT_AMBULATORY_CARE_PROVIDER_SITE_OTHER): Payer: Self-pay

## 2019-07-18 ENCOUNTER — Encounter: Payer: Self-pay | Admitting: Family Medicine

## 2019-07-18 ENCOUNTER — Encounter: Payer: Self-pay | Admitting: Nurse Practitioner

## 2019-07-18 ENCOUNTER — Other Ambulatory Visit: Payer: Self-pay | Admitting: *Deleted

## 2019-07-18 MED ORDER — CHLORZOXAZONE 500 MG PO TABS: 500.0000 mg | ORAL_TABLET | Freq: Two times a day (BID) | ORAL | 1 refills | Status: DC

## 2019-07-18 NOTE — Telephone Encounter (Signed)
Nurses May send in Parafon forte 1 twice daily as needed muscle spasms or muscle tightness Caution drowsiness Not for frequent use #20 with 1 refill

## 2019-07-25 ENCOUNTER — Encounter (INDEPENDENT_AMBULATORY_CARE_PROVIDER_SITE_OTHER): Payer: Self-pay

## 2019-07-26 ENCOUNTER — Telehealth (INDEPENDENT_AMBULATORY_CARE_PROVIDER_SITE_OTHER): Payer: Self-pay | Admitting: Nurse Practitioner

## 2019-07-26 NOTE — Telephone Encounter (Signed)
Shannon Lin, pls add patient to my schedule at 4pm on Thurs 10/22. I spoke to her mother and sent pt a portal msg regarding this appt. thx.

## 2019-07-27 ENCOUNTER — Ambulatory Visit (INDEPENDENT_AMBULATORY_CARE_PROVIDER_SITE_OTHER): Payer: Medicaid Other | Admitting: Nurse Practitioner

## 2019-07-28 ENCOUNTER — Ambulatory Visit (INDEPENDENT_AMBULATORY_CARE_PROVIDER_SITE_OTHER): Payer: Medicaid Other | Admitting: Nurse Practitioner

## 2019-07-28 ENCOUNTER — Other Ambulatory Visit: Payer: Self-pay

## 2019-07-28 ENCOUNTER — Encounter (INDEPENDENT_AMBULATORY_CARE_PROVIDER_SITE_OTHER): Payer: Self-pay | Admitting: Nurse Practitioner

## 2019-07-28 VITALS — BP 106/68 | HR 94 | Temp 98.9°F | Ht 61.0 in | Wt 215.5 lb

## 2019-07-28 DIAGNOSIS — K625 Hemorrhage of anus and rectum: Secondary | ICD-10-CM | POA: Diagnosis not present

## 2019-07-28 DIAGNOSIS — K602 Anal fissure, unspecified: Secondary | ICD-10-CM | POA: Diagnosis not present

## 2019-07-28 NOTE — Patient Instructions (Signed)
1.  Schedule a colonoscopy  2.  Apply Desitin inside the anal opening to the external anal area 2-3 times daily for the next 2 weeks  3.  Complete the provided lab order  4.  Further follow-up to be determined after colonoscopy completed.  Call our office if your rectal bleeding persist or worsens.
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
## 2019-07-29 ENCOUNTER — Encounter (INDEPENDENT_AMBULATORY_CARE_PROVIDER_SITE_OTHER): Payer: Self-pay

## 2019-08-02 ENCOUNTER — Telehealth (INDEPENDENT_AMBULATORY_CARE_PROVIDER_SITE_OTHER): Payer: Self-pay | Admitting: *Deleted

## 2019-08-02 ENCOUNTER — Encounter (INDEPENDENT_AMBULATORY_CARE_PROVIDER_SITE_OTHER): Payer: Self-pay | Admitting: *Deleted

## 2019-08-02 DIAGNOSIS — K625 Hemorrhage of anus and rectum: Secondary | ICD-10-CM

## 2019-08-02 MED ORDER — SUPREP BOWEL PREP KIT 17.5-3.13-1.6 GM/177ML PO SOLN: 1.0000 | Freq: Once | ORAL | 0 refills | Status: AC

## 2019-08-02 NOTE — Patient Instructions (Signed)
Shannon Lin  08/02/2019     @PREFPERIOPPHARMACY @   Your procedure is scheduled on  08/05/2019 .  Report to [facility] at  1240  P.M.  Call this number if you have problems the morning of surgery:  [phone]   Remember:  Follow the diet and prep instructions given to you by Dr Olevia Perches office.                       Take these medicines the morning of surgery with A SIP OF WATER  Abilify, lamictil, prilosec, zofran(if needed). DO NOT take any medications for diabetes the morning of your procedure.    Do not wear jewelry, make-up or nail polish.  Do not wear lotions, powders, or perfumes. Please wear deodorant and brush your teeth.  Do not shave 48 hours prior to surgery.  Men may shave face and neck.  Do not bring valuables to the hospital.  [facility] is not responsible for any belongings or valuables.  Contacts, dentures or bridgework may not be worn into surgery.  Leave your suitcase in the car.  After surgery it may be brought to your room.  For patients admitted to the hospital, discharge time will be determined by your treatment team.  Patients discharged the day of surgery will not be allowed to drive home.   Name and phone number of your driver:   family Special instructions:  None  Please read over the following fact sheets that you were given. Anesthesia Post-op Instructions and Care and Recovery After Surgery       Colonoscopy, Adult, Care After This sheet gives you information about how to care for yourself after your procedure. Your health care provider may also give you more specific instructions. If you have problems or questions, contact your health care provider. What can I expect after the procedure? After the procedure, it is common to have:  A small amount of blood in your stool for 24 hours after the procedure.  Some gas.  Mild abdominal cramping or bloating. Follow these instructions at home: General instructions  For the first  24 hours after the procedure: ? Do not drive or use machinery. ? Do not sign important documents. ? Do not drink alcohol. ? Do your regular daily activities at a slower pace than normal. ? Eat soft, easy-to-digest foods.  Take over-the-counter or prescription medicines only as told by your health care provider. Relieving cramping and bloating   Try walking around when you have cramps or feel bloated.  Apply heat to your abdomen as told by your health care provider. Use a heat source that your health care provider recommends, such as a moist heat pack or a heating pad. ? Place a towel between your skin and the heat source. ? Leave the heat on for 20-30 minutes. ? Remove the heat if your skin turns bright red. This is especially important if you are unable to feel pain, heat, or cold. You may have a greater risk of getting burned. Eating and drinking   Drink enough fluid to keep your urine pale yellow.  Resume your normal diet as instructed by your health care provider. Avoid heavy or fried foods that are hard to digest.  Avoid drinking alcohol for as long as instructed by your health care provider. Contact a health care provider if:  You have blood in your stool 2-3 days after the procedure. Get help right away if:  You have more than a small spotting of blood in your stool.  You pass large blood clots in your stool.  Your abdomen is swollen.  You have nausea or vomiting.  You have a fever.  You have increasing abdominal pain that is not relieved with medicine. Summary  After the procedure, it is common to have a small amount of blood in your stool. You may also have mild abdominal cramping and bloating.  For the first 24 hours after the procedure, do not drive or use machinery, sign important documents, or drink alcohol.  Contact your health care provider if you have a lot of blood in your stool, nausea or vomiting, a fever, or increased abdominal pain. This  information is not intended to replace advice given to you by your health care provider. Make sure you discuss any questions you have with your health care provider. Document Released: 05/06/2004 Document Revised: 07/15/2017 Document Reviewed: 12/04/2015 Elsevier Patient Education  2020 [place] After These instructions provide you with information about caring for yourself after your procedure. Your health care provider may also give you more specific instructions. Your treatment has been planned according to current medical practices, but problems sometimes occur. Call your health care provider if you have any problems or questions after your procedure. What can I expect after the procedure? After your procedure, you may:  Feel sleepy for several hours.  Feel clumsy and have poor balance for several hours.  Feel forgetful about what happened after the procedure.  Have poor judgment for several hours.  Feel nauseous or vomit.  Have a sore throat if you had a breathing tube during the procedure. Follow these instructions at home: For at least 24 hours after the procedure:      Have a responsible adult stay with you. It is important to have someone help care for you until you are awake and alert.  Rest as needed.  Do not: ? Participate in activities in which you could fall or become injured. ? Drive. ? Use heavy machinery. ? Drink alcohol. ? Take sleeping pills or medicines that cause drowsiness. ? Make important decisions or sign legal documents. ? Take care of children on your own. Eating and drinking  Follow the diet that is recommended by your health care provider.  If you vomit, drink water, juice, or soup when you can drink without vomiting.  Make sure you have little or no nausea before eating solid foods. General instructions  Take over-the-counter and prescription medicines only as told by your health care provider.  If you  have sleep apnea, surgery and certain medicines can increase your risk for breathing problems. Follow instructions from your health care provider about wearing your sleep device: ? Anytime you are sleeping, including during daytime naps. ? While taking prescription pain medicines, sleeping medicines, or medicines that make you drowsy.  If you smoke, do not smoke without supervision.  Keep all follow-up visits as told by your health care provider. This is important. Contact a health care provider if:  You keep feeling nauseous or you keep vomiting.  You feel light-headed.  You develop a rash.  You have a fever. Get help right away if:  You have trouble breathing. Summary  For several hours after your procedure, you may feel sleepy and have poor judgment.  Have a responsible adult stay with you for at least 24 hours or until you are awake and alert. This information is not intended to  replace advice given to you by your health care provider. Make sure you discuss any questions you have with your health care provider. Document Released: 01/13/2016 Document Revised: 12/21/2017 Document Reviewed: 01/13/2016 Elsevier Patient Education  2020 [employer].

## 2019-08-02 NOTE — Telephone Encounter (Signed)
Patient needs suprep TCS sch'd 10/30

## 2019-08-03 ENCOUNTER — Other Ambulatory Visit (HOSPITAL_COMMUNITY)
Admission: RE | Admit: 2019-08-03 | Discharge: 2019-08-03 | Disposition: A | Discharge status: Home / Self Care | Payer: Medicaid Other | Source: Ambulatory Visit | Attending: Internal Medicine | Admitting: Internal Medicine

## 2019-08-03 ENCOUNTER — Encounter (HOSPITAL_COMMUNITY)
Admission: RE | Admit: 2019-08-03 | Discharge: 2019-08-03 | Disposition: A | Discharge status: Home / Self Care | Payer: Medicaid Other | Source: Ambulatory Visit | Attending: Internal Medicine | Admitting: Internal Medicine

## 2019-08-03 ENCOUNTER — Encounter (HOSPITAL_COMMUNITY): Payer: Self-pay

## 2019-08-03 ENCOUNTER — Other Ambulatory Visit: Payer: Self-pay

## 2019-08-03 DIAGNOSIS — K602 Anal fissure, unspecified: Secondary | ICD-10-CM

## 2019-08-03 DIAGNOSIS — Z01812 Encounter for preprocedural laboratory examination: Secondary | ICD-10-CM | POA: Diagnosis not present

## 2019-08-03 DIAGNOSIS — K625 Hemorrhage of anus and rectum: Secondary | ICD-10-CM | POA: Diagnosis not present

## 2019-08-03 HISTORY — DX: Unspecified convulsions: R56.9

## 2019-08-03 LAB — COMPREHENSIVE METABOLIC PANEL
ALT: 56 U/L — ABNORMAL HIGH (ref 0–44)
AST: 41 U/L (ref 15–41)
Albumin: 3.8 g/dL (ref 3.5–5.0)
Alkaline Phosphatase: 80 U/L (ref 38–126)
Anion gap: 9 (ref 5–15)
BUN: 8 mg/dL (ref 6–20)
CO2: 24 mmol/L (ref 22–32)
Calcium: 8.7 mg/dL — ABNORMAL LOW (ref 8.9–10.3)
Chloride: 102 mmol/L (ref 98–111)
Creatinine, Ser: 0.46 mg/dL (ref 0.44–1.00)
GFR calc Af Amer: >60 mL/min (ref >60)
GFR calc non Af Amer: >60 mL/min (ref >60)
Glucose, Bld: 145 mg/dL — ABNORMAL HIGH (ref 70–99)
Potassium: 3.6 mmol/L (ref 3.5–5.1)
Sodium: 135 mmol/L (ref 135–145)
Total Bilirubin: 0.4 mg/dL (ref 0.3–1.2)
Total Protein: 7.4 g/dL (ref 6.5–8.1)

## 2019-08-03 LAB — CBC WITH DIFFERENTIAL/PLATELET
Abs Immature Granulocytes: 0.03 10*3/uL (ref 0.00–0.07)
Basophils Absolute: 0 10*3/uL (ref 0.0–0.1)
Basophils Relative: 0 %
Eosinophils Absolute: 0 10*3/uL (ref 0.0–0.5)
Eosinophils Relative: 1 %
HCT: 38.4 % (ref 36.0–46.0)
Hemoglobin: 12.5 g/dL (ref 12.0–15.0)
Immature Granulocytes: 0 %
Lymphocytes Relative: 23 %
Lymphs Abs: 1.6 10*3/uL (ref 0.7–4.0)
MCH: 28.8 pg (ref 26.0–34.0)
MCHC: 32.6 g/dL (ref 30.0–36.0)
MCV: 88.5 fL (ref 80.0–100.0)
Monocytes Absolute: 0.4 10*3/uL (ref 0.1–1.0)
Monocytes Relative: 5 %
Neutro Abs: 4.9 10*3/uL (ref 1.7–7.7)
Neutrophils Relative %: 71 %
Platelets: 319 10*3/uL (ref 150–400)
RBC: 4.34 MIL/uL (ref 3.87–5.11)
RDW: 12.4 % (ref 11.5–15.5)
WBC: 7 10*3/uL (ref 4.0–10.5)
nRBC: 0 % (ref 0.0–0.2)

## 2019-08-03 LAB — C-REACTIVE PROTEIN: CRP: 1.2 mg/dL — ABNORMAL HIGH (ref <1.0)

## 2019-08-03 LAB — HCG, SERUM, QUALITATIVE: Preg, Serum: NEGATIVE

## 2019-08-04 ENCOUNTER — Other Ambulatory Visit (HOSPITAL_COMMUNITY)
Admission: RE | Admit: 2019-08-04 | Discharge: 2019-08-04 | Disposition: A | Discharge status: Home / Self Care | Payer: Medicaid Other | Source: Ambulatory Visit | Attending: Internal Medicine | Admitting: Internal Medicine

## 2019-08-04 ENCOUNTER — Encounter (INDEPENDENT_AMBULATORY_CARE_PROVIDER_SITE_OTHER): Payer: Self-pay

## 2019-08-04 ENCOUNTER — Other Ambulatory Visit (HOSPITAL_COMMUNITY): Payer: Self-pay | Admitting: *Deleted

## 2019-08-05 ENCOUNTER — Ambulatory Visit (HOSPITAL_COMMUNITY): Payer: Medicaid Other | Admitting: Anesthesiology

## 2019-08-05 ENCOUNTER — Other Ambulatory Visit (HOSPITAL_COMMUNITY): Payer: Medicaid Other

## 2019-08-05 ENCOUNTER — Encounter (HOSPITAL_COMMUNITY): Payer: Self-pay | Admitting: Anesthesiology

## 2019-08-05 ENCOUNTER — Other Ambulatory Visit (HOSPITAL_COMMUNITY)
Admission: RE | Admit: 2019-08-05 | Discharge: 2019-08-05 | Disposition: A | Discharge status: Home / Self Care | Payer: Medicaid Other | Source: Ambulatory Visit | Attending: Internal Medicine | Admitting: Internal Medicine

## 2019-08-05 ENCOUNTER — Encounter (HOSPITAL_COMMUNITY)
Admission: RE | Disposition: A | Discharge status: Home / Self Care | Payer: Self-pay | Source: Home / Self Care | Attending: Internal Medicine

## 2019-08-05 ENCOUNTER — Ambulatory Visit (HOSPITAL_COMMUNITY)
Admission: RE | Admit: 2019-08-05 | Discharge: 2019-08-05 | Disposition: A | Discharge status: Home / Self Care | Payer: Medicaid Other | Attending: Internal Medicine | Admitting: Internal Medicine

## 2019-08-05 ENCOUNTER — Other Ambulatory Visit: Payer: Self-pay

## 2019-08-05 DIAGNOSIS — K219 Gastro-esophageal reflux disease without esophagitis: Secondary | ICD-10-CM | POA: Diagnosis not present

## 2019-08-05 DIAGNOSIS — F840 Autistic disorder: Secondary | ICD-10-CM | POA: Diagnosis not present

## 2019-08-05 DIAGNOSIS — Z823 Family history of stroke: Secondary | ICD-10-CM | POA: Insufficient documentation

## 2019-08-05 DIAGNOSIS — Z7984 Long term (current) use of oral hypoglycemic drugs: Secondary | ICD-10-CM | POA: Diagnosis not present

## 2019-08-05 DIAGNOSIS — F909 Attention-deficit hyperactivity disorder, unspecified type: Secondary | ICD-10-CM | POA: Insufficient documentation

## 2019-08-05 DIAGNOSIS — J45909 Unspecified asthma, uncomplicated: Secondary | ICD-10-CM | POA: Insufficient documentation

## 2019-08-05 DIAGNOSIS — Z79899 Other long term (current) drug therapy: Secondary | ICD-10-CM | POA: Diagnosis not present

## 2019-08-05 DIAGNOSIS — F418 Other specified anxiety disorders: Secondary | ICD-10-CM | POA: Diagnosis not present

## 2019-08-05 DIAGNOSIS — D123 Benign neoplasm of transverse colon: Secondary | ICD-10-CM | POA: Diagnosis not present

## 2019-08-05 DIAGNOSIS — Z8249 Family history of ischemic heart disease and other diseases of the circulatory system: Secondary | ICD-10-CM | POA: Insufficient documentation

## 2019-08-05 DIAGNOSIS — Z800 Family history of malignant neoplasm of digestive organs: Secondary | ICD-10-CM | POA: Insufficient documentation

## 2019-08-05 DIAGNOSIS — Z820 Family history of epilepsy and other diseases of the nervous system: Secondary | ICD-10-CM | POA: Insufficient documentation

## 2019-08-05 DIAGNOSIS — H9325 Central auditory processing disorder: Secondary | ICD-10-CM | POA: Insufficient documentation

## 2019-08-05 DIAGNOSIS — Z833 Family history of diabetes mellitus: Secondary | ICD-10-CM | POA: Insufficient documentation

## 2019-08-05 DIAGNOSIS — Z20828 Contact with and (suspected) exposure to other viral communicable diseases: Secondary | ICD-10-CM | POA: Diagnosis not present

## 2019-08-05 DIAGNOSIS — K602 Anal fissure, unspecified: Secondary | ICD-10-CM | POA: Insufficient documentation

## 2019-08-05 DIAGNOSIS — K625 Hemorrhage of anus and rectum: Secondary | ICD-10-CM | POA: Insufficient documentation

## 2019-08-05 DIAGNOSIS — K921 Melena: Secondary | ICD-10-CM

## 2019-08-05 DIAGNOSIS — F319 Bipolar disorder, unspecified: Secondary | ICD-10-CM | POA: Diagnosis not present

## 2019-08-05 HISTORY — PX: POLYPECTOMY: SHX5525

## 2019-08-05 HISTORY — PX: COLONOSCOPY WITH PROPOFOL: SHX5780

## 2019-08-05 LAB — GLUCOSE, CAPILLARY: Glucose-Capillary: 84 mg/dL (ref 70–99)

## 2019-08-05 LAB — SARS CORONAVIRUS 2 BY RT PCR (HOSPITAL ORDER, PERFORMED IN ~~LOC~~ HOSPITAL LAB): SARS Coronavirus 2: NEGATIVE

## 2019-08-05 SURGERY — COLONOSCOPY WITH PROPOFOL
Anesthesia: General

## 2019-08-05 MED ORDER — KETAMINE HCL 50 MG/5ML IJ SOSY
PREFILLED_SYRINGE | INTRAMUSCULAR | Status: AC
  Filled 2019-08-05: qty 5

## 2019-08-05 MED ORDER — CHLORHEXIDINE GLUCONATE CLOTH 2 % EX PADS: 6.0000 | MEDICATED_PAD | Freq: Once | CUTANEOUS | Status: DC

## 2019-08-05 MED ORDER — LIDOCAINE HCL (CARDIAC) PF 100 MG/5ML IV SOSY
PREFILLED_SYRINGE | INTRAVENOUS | Status: DC | PRN
  Administered 2019-08-05: 40 mg via INTRAVENOUS

## 2019-08-05 MED ORDER — LACTATED RINGERS IV SOLN
Freq: Once | INTRAVENOUS | Status: AC
  Administered 2019-08-05: 13:00:00 via INTRAVENOUS

## 2019-08-05 MED ORDER — LACTATED RINGERS IV SOLN
INTRAVENOUS | Status: DC | PRN
  Administered 2019-08-05: 13:00:00 via INTRAVENOUS

## 2019-08-05 MED ORDER — PROPOFOL 10 MG/ML IV BOLUS
INTRAVENOUS | Status: DC | PRN
  Administered 2019-08-05: 20 mg via INTRAVENOUS

## 2019-08-05 MED ORDER — KETAMINE HCL 10 MG/ML IJ SOLN
INTRAMUSCULAR | Status: DC | PRN
  Administered 2019-08-05: 20 mg via INTRAVENOUS

## 2019-08-05 MED ORDER — GLYCOPYRROLATE 0.2 MG/ML IJ SOLN
INTRAMUSCULAR | Status: DC | PRN
  Administered 2019-08-05: 0.2 mg via INTRAVENOUS

## 2019-08-05 MED ORDER — PROPOFOL 10 MG/ML IV BOLUS
INTRAVENOUS | Status: AC
  Filled 2019-08-05: qty 40

## 2019-08-05 MED ORDER — PROPOFOL 500 MG/50ML IV EMUL
INTRAVENOUS | Status: DC | PRN
  Administered 2019-08-05: 150 ug/kg/min via INTRAVENOUS
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
## 2019-08-05 NOTE — Anesthesia Preprocedure Evaluation (Addendum)
Anesthesia Evaluation  Patient identified by MRN, date of birth, ID band Patient awake    Reviewed: Allergy & Precautions, NPO status , Patient's Chart, lab work & pertinent test results  Airway Mallampati: II  TM Distance: >3 FB Neck ROM: Full    Dental no notable dental hx.    Pulmonary asthma ,    Pulmonary exam normal        Cardiovascular Exercise Tolerance: Good Normal cardiovascular exam     Neuro/Psych Seizures -, Well Controlled,  PSYCHIATRIC DISORDERS Anxiety Bipolar Disorder    GI/Hepatic Neg liver ROS, GERD  Medicated and Controlled,  Endo/Other  Morbid obesity  Renal/GU negative Renal ROS     Musculoskeletal negative musculoskeletal ROS (+)   Abdominal   Peds  Hematology   Anesthesia Other Findings   Reproductive/Obstetrics negative OB ROS                            Anesthesia Physical Anesthesia Plan  ASA: II  Anesthesia Plan: General   Post-op Pain Management:    Induction:   PONV Risk Score and Plan: TIVA  Airway Management Planned: Nasal Cannula, Natural Airway and Simple Face Mask  Additional Equipment:   Intra-op Plan:   Post-operative Plan:   Informed Consent: I have reviewed the patients History and Physical, chart, labs and discussed the procedure including the risks, benefits and alternatives for the proposed anesthesia with the patient or authorized representative who has indicated his/her understanding and acceptance.     Dental advisory given  Plan Discussed with: CRNA  Anesthesia Plan Comments:         Anesthesia Quick Evaluation

## 2019-08-05 NOTE — Anesthesia Postprocedure Evaluation (Signed)
Anesthesia Post Note  Patient: Shannon Lin  Procedure(s) Performed: COLONOSCOPY WITH PROPOFOL (N/A ) POLYPECTOMY  Patient location during evaluation: PACU Anesthesia Type: General Level of consciousness: awake and alert and oriented Pain management: pain level controlled Vital Signs Assessment: post-procedure vital signs reviewed and stable Cardiovascular status: stable Postop Assessment: no apparent nausea or vomiting Anesthetic complications: no     Last Vitals:  Vitals:   08/05/19 1325 08/05/19 1358  BP: 126/68 (!) 101/57  Pulse: 90 86  Resp: 18 18  Temp: 36.7 C 37.1 C  SpO2: 94% 96%    Last Pain:  Vitals:   08/05/19 1358  PainSc: 0-No pain                 Lieutenant Abarca A

## 2019-08-05 NOTE — Discharge Instructions (Addendum)
Resume usual medications as before. Can use Desitin cream as before for another 2 weeks. High-fiber diet. No driving for 24 hours. Physician will call with biopsy results.

## 2019-08-05 NOTE — Anesthesia Procedure Notes (Signed)
Procedure Name: General with mask airway Date/Time: 08/05/2019 1:33 PM Performed by: Andree Elk, Amy A, CRNA Pre-anesthesia Checklist: Timeout performed, Patient being monitored, Suction available, Emergency Drugs available and Patient identified Patient Re-evaluated:Patient Re-evaluated prior to induction Oxygen Delivery Method: Non-rebreather mask

## 2019-08-05 NOTE — Transfer of Care (Signed)
Immediate Anesthesia Transfer of Care Note  Patient: Shannon Lin  Procedure(s) Performed: COLONOSCOPY WITH PROPOFOL (N/A ) POLYPECTOMY  Patient Location: PACU  Anesthesia Type:General  Level of Consciousness: awake, alert , oriented and patient cooperative  Airway & Oxygen Therapy: Patient Spontanous Breathing  Post-op Assessment: Report given to RN and Post -op Vital signs reviewed and stable  Post vital signs: Reviewed and stable  Last Vitals:  Vitals Value Taken Time  BP 100/57 08/05/19 1400  Temp 37.1 C 08/05/19 1358  Pulse 84 08/05/19 1402  Resp 23 08/05/19 1402  SpO2 96 % 08/05/19 1402  Vitals shown include unvalidated device data.  Last Pain:  Vitals:   08/05/19 1358  PainSc: 0-No pain      Patients Stated Pain Goal: 4 ([id number])  Complications: No apparent anesthesia complications

## 2019-08-05 NOTE — Op Note (Signed)
[facility] Patient Name: Shannon Lin Procedure Date: 08/05/2019 1:24 PM MRN: [id number] Date of Birth: 1991-03-27 Attending MD: Hildred Laser , MD CSN: [id number] Age: 28 Admit Type: Outpatient Procedure:                Colonoscopy Indications:              Hematochezia Providers:                Hildred Laser, MD, Jeanann Lewandowsky. Sharon Seller, RN, Nelma Rothman, Technician Referring MD:             Elayne Snare. Luking Medicines:                Propofol per Anesthesia Complications:            No immediate complications. Estimated Blood Loss:     Estimated blood loss was minimal. Procedure:                Pre-Anesthesia Assessment:                           - Prior to the procedure, a History and Physical                            was performed, and patient medications and                            allergies were reviewed. The patient's tolerance of                            previous anesthesia was also reviewed. The risks                            and benefits of the procedure and the sedation                            options and risks were discussed with the patient.                            All questions were answered, and informed consent                            was obtained. Prior Anticoagulants: The patient has                            taken no previous anticoagulant or antiplatelet                            agents except for NSAID medication. ASA Grade                            Assessment: III - A patient with severe systemic  disease. After reviewing the risks and benefits,                            the patient was deemed in satisfactory condition to                            undergo the procedure.                           After obtaining informed consent, the colonoscope                            was passed under direct vision. Throughout the                            procedure, the patient's blood pressure,  pulse, and                            oxygen saturations were monitored continuously. The                            [id number] scope was introduced through                            the anus and advanced to the the cecum, identified                            by appendiceal orifice and ileocecal valve. The                            colonoscopy was performed without difficulty. The                            patient tolerated the procedure well. The quality                            of the bowel preparation was good except the rectum                            and cecum was unsatisfactory. The ileocecal valve,                            appendiceal orifice, and rectum were photographed. Scope In: 1:39:30 PM Scope Out: 1:52:29 PM Scope Withdrawal Time: 0 hours 8 minutes 23 seconds  Total Procedure Duration: 0 hours 12 minutes 59 seconds  Findings:      The digital rectal exam was normal. Small fissure noted at 5 'oclock       away from anal orifice.      A small polyp was found in the transverse colon. Biopsies were taken       with a cold forceps for histology.      The exam was otherwise normal throughout the examined colon.      The retroflexed view of the distal rectum and anal verge was normal and  showed no anal or rectal abnormalities. Impression:               - One small polyp in the transverse colon. Biopsied.                           - Small anal fissure almost healed( 4 mm in lenght)                            - Bowel prep at cecum and rectum suboptimal. Moderate Sedation:      Per Anesthesia Care Recommendation:           - Patient has a contact number available for                            emergencies. The signs and symptoms of potential                            delayed complications were discussed with the                            patient. Return to normal activities tomorrow.                            Written discharge instructions were provided  to the                            patient.                           - High fiber diet today.                           - Continue present medications.                           - Desitin to the external anal area 3 times daily                            for the next 2 weeks                           - Await pathology results.                           - Repeat colonoscopy for surveillance based on                            pathology results. Procedure Code(s):        --- Professional ---                           [phone], Colonoscopy, flexible; with biopsy, single                            or multiple Diagnosis Code(s):        --- Professional ---  K63.5, Polyp of colon                           K92.1, Melena (includes Hematochezia) CPT copyright 2019 American Medical Association. All rights reserved. The codes documented in this report are preliminary and upon coder review may  be revised to meet current compliance requirements. Hildred Laser, MD Hildred Laser, MD 08/05/2019 2:05:58 PM This report has been signed electronically. Number of Addenda: 0

## 2019-08-05 NOTE — Anesthesia Postprocedure Evaluation (Signed)
Anesthesia Post Note  Patient: Shannon Lin  Procedure(s) Performed: COLONOSCOPY WITH PROPOFOL (N/A ) POLYPECTOMY  Patient location during evaluation: PACU Anesthesia Type: General Level of consciousness: awake and alert, oriented and patient cooperative Pain management: pain level controlled Vital Signs Assessment: post-procedure vital signs reviewed and stable Respiratory status: spontaneous breathing and respiratory function stable Cardiovascular status: stable Postop Assessment: no apparent nausea or vomiting Anesthetic complications: no     Last Vitals:  Vitals:   08/05/19 1325 08/05/19 1358  BP: 126/68 (!) 101/57  Pulse: 90 86  Resp: 18 18  Temp: 36.7 C 37.1 C  SpO2: 94% 96%    Last Pain:  Vitals:   08/05/19 1358  PainSc: 0-No pain                 ADAMS, AMY A

## 2019-08-08 ENCOUNTER — Other Ambulatory Visit (INDEPENDENT_AMBULATORY_CARE_PROVIDER_SITE_OTHER): Payer: Self-pay | Admitting: *Deleted

## 2019-08-08 DIAGNOSIS — R7982 Elevated C-reactive protein (CRP): Secondary | ICD-10-CM

## 2019-08-08 LAB — SURGICAL PATHOLOGY

## 2019-08-09 ENCOUNTER — Encounter (HOSPITAL_COMMUNITY): Payer: Self-pay | Admitting: Internal Medicine

## 2019-08-11 ENCOUNTER — Encounter: Payer: Self-pay | Admitting: Family Medicine

## 2019-08-11 DIAGNOSIS — D126 Benign neoplasm of colon, unspecified: Secondary | ICD-10-CM | POA: Insufficient documentation

## 2019-08-11 HISTORY — DX: Benign neoplasm of colon, unspecified: D12.6

## 2019-08-17 ENCOUNTER — Encounter: Payer: Self-pay | Admitting: Nurse Practitioner

## 2019-08-17 NOTE — Telephone Encounter (Signed)
Pt states she taste blood all the time it is worse when she eats or drinks but it is constant. Started last week. Feels fine and  No other symptoms.

## 2019-08-18 ENCOUNTER — Encounter: Payer: Self-pay | Admitting: Nurse Practitioner

## 2019-08-19 ENCOUNTER — Encounter: Payer: Self-pay | Admitting: Nurse Practitioner

## 2019-09-20 ENCOUNTER — Encounter: Payer: Self-pay | Admitting: Family Medicine

## 2019-09-21 ENCOUNTER — Other Ambulatory Visit: Payer: Self-pay | Admitting: Family Medicine

## 2019-09-21 MED ORDER — VALACYCLOVIR HCL 1 G PO TABS: ORAL_TABLET | ORAL | 3 refills | Status: DC

## 2019-10-10 ENCOUNTER — Encounter: Payer: Self-pay | Admitting: Nurse Practitioner

## 2019-10-13 ENCOUNTER — Encounter: Payer: Self-pay | Admitting: Family Medicine

## 2019-10-13 ENCOUNTER — Ambulatory Visit (INDEPENDENT_AMBULATORY_CARE_PROVIDER_SITE_OTHER): Payer: Medicaid Other | Admitting: Family Medicine

## 2019-10-13 DIAGNOSIS — R202 Paresthesia of skin: Secondary | ICD-10-CM

## 2019-10-13 DIAGNOSIS — R29898 Other symptoms and signs involving the musculoskeletal system: Secondary | ICD-10-CM

## 2019-10-13 DIAGNOSIS — G459 Transient cerebral ischemic attack, unspecified: Secondary | ICD-10-CM

## 2019-10-13 DIAGNOSIS — Z13220 Encounter for screening for lipoid disorders: Secondary | ICD-10-CM

## 2019-10-13 DIAGNOSIS — R7303 Prediabetes: Secondary | ICD-10-CM

## 2019-10-13 HISTORY — DX: Prediabetes: R73.03

## 2019-10-13 NOTE — Progress Notes (Signed)
   Subjective:    Patient ID: Shannon Lin, female    DOB: 04-Sep-1991, 29 y.o.   MRN: [id number]  HPI  Patient calls for a follow up from ER follow up for tingling and numbness in left arm and leg. CT scan was normal and stroke work up was normal. Sugar was 140 and patient has history of type 2 diabetes when she left ER sugar was down to 118. Patient denied double vision It was severe enough in her left arm that she could not raise it up above her shoulder level.  And she also had difficulty with the left leg Virtual Visit via Video Note  I connected with [place] on 10/13/19 at  2:00 PM EST by a video enabled telemedicine application and verified that I am speaking with the correct person using two identifiers.  Location: Patient: home Provider: office   I discussed the limitations of evaluation and management by telemedicine and the availability of in person appointments. The patient expressed understanding and agreed to proceed.  History of Present Illness:    Observations/Objective:   Assessment and Plan:   Follow Up Instructions:    I discussed the assessment and treatment plan with the patient. The patient was provided an opportunity to ask questions and all were answered. The patient agreed with the plan and demonstrated an understanding of the instructions.   The patient was advised to call back or seek an in-person evaluation if the symptoms worsen or if the condition fails to improve as anticipated.  I provided 16 minutes of non-face-to-face time during this encounter.     Review of Systems  Constitutional: Negative for activity change, appetite change and fatigue.  HENT: Negative for congestion and rhinorrhea.   Respiratory: Negative for cough and shortness of breath.   Cardiovascular: Negative for chest pain and leg swelling.  Gastrointestinal: Negative for abdominal pain and diarrhea.  Endocrine: Negative for polydipsia and polyphagia.  Skin:  Negative for color change.  Neurological: Positive for weakness and numbness. Negative for dizziness.  Psychiatric/Behavioral: Negative for behavioral problems and confusion.       Objective:   Physical Exam  Today's visit was via telephone Physical exam was not possible for this visit       Assessment & Plan:  Left arm weakness along with left-sided numbness Probable TIA Recommend carotid ultrasound also recommend MRI of the brain Await the results of this May take 81 mg aspirin daily until results of the test come back May or may not need to see neurology.

## 2019-10-16 ENCOUNTER — Encounter: Payer: Self-pay | Admitting: Nurse Practitioner

## 2019-10-17 NOTE — Telephone Encounter (Signed)
Left message to return call 

## 2019-10-18 NOTE — Telephone Encounter (Signed)
Left message to return call 

## 2019-10-18 NOTE — Telephone Encounter (Signed)
Mother called back and said she was doing fine and does not need anything at this time.

## 2019-10-21 ENCOUNTER — Other Ambulatory Visit: Payer: Self-pay

## 2019-10-21 ENCOUNTER — Ambulatory Visit (HOSPITAL_COMMUNITY)
Admission: RE | Admit: 2019-10-21 | Discharge: 2019-10-21 | Disposition: A | Discharge status: Home / Self Care | Payer: Medicaid Other | Source: Ambulatory Visit | Attending: Family Medicine | Admitting: Family Medicine

## 2019-10-21 DIAGNOSIS — G459 Transient cerebral ischemic attack, unspecified: Secondary | ICD-10-CM | POA: Insufficient documentation

## 2019-10-25 ENCOUNTER — Other Ambulatory Visit: Payer: Self-pay

## 2019-10-25 ENCOUNTER — Ambulatory Visit (HOSPITAL_COMMUNITY)
Admission: RE | Admit: 2019-10-25 | Discharge: 2019-10-25 | Disposition: A | Discharge status: Home / Self Care | Payer: Medicaid Other | Source: Ambulatory Visit | Attending: Family Medicine | Admitting: Family Medicine

## 2019-10-25 DIAGNOSIS — G459 Transient cerebral ischemic attack, unspecified: Secondary | ICD-10-CM | POA: Diagnosis not present

## 2019-10-26 LAB — BASIC METABOLIC PANEL
BUN/Creatinine Ratio: 10 (ref 9–23)
BUN: 8 mg/dL (ref 6–20)
CO2: 26 mmol/L (ref 20–29)
Calcium: 9.6 mg/dL (ref 8.7–10.2)
Chloride: 102 mmol/L (ref 96–106)
Creatinine, Ser: 0.83 mg/dL (ref 0.57–1.00)
GFR calc Af Amer: 110 mL/min/{1.73_m2} (ref >59)
GFR calc non Af Amer: 96 mL/min/{1.73_m2} (ref >59)
Glucose: 84 mg/dL (ref 65–99)
Potassium: 4.7 mmol/L (ref 3.5–5.2)
Sodium: 142 mmol/L (ref 134–144)

## 2019-10-26 LAB — HEPATIC FUNCTION PANEL
ALT: 33 IU/L — ABNORMAL HIGH (ref 0–32)
AST: 26 IU/L (ref 0–40)
Albumin: 4.6 g/dL (ref 3.9–5.0)
Alkaline Phosphatase: 103 IU/L (ref 39–117)
Bilirubin Total: 0.6 mg/dL (ref 0.0–1.2)
Bilirubin, Direct: 0.11 mg/dL (ref 0.00–0.40)
Total Protein: 7.3 g/dL (ref 6.0–8.5)

## 2019-10-26 LAB — LIPID PANEL
Chol/HDL Ratio: 3.5 ratio (ref 0.0–4.4)
Cholesterol, Total: 161 mg/dL (ref 100–199)
HDL: 46 mg/dL (ref >39)
LDL Chol Calc (NIH): 100 mg/dL — ABNORMAL HIGH (ref 0–99)
Triglycerides: 79 mg/dL (ref 0–149)
VLDL Cholesterol Cal: 15 mg/dL (ref 5–40)

## 2019-10-26 LAB — HEMOGLOBIN A1C
Est. average glucose Bld gHb Est-mCnc: 123 mg/dL
Hgb A1c MFr Bld: 5.9 % — ABNORMAL HIGH (ref 4.8–5.6)

## 2019-10-28 ENCOUNTER — Telehealth: Payer: Self-pay | Admitting: Family Medicine

## 2019-10-28 DIAGNOSIS — Z029 Encounter for administrative examinations, unspecified: Secondary | ICD-10-CM

## 2019-10-28 NOTE — Telephone Encounter (Signed)
Patient Mom brought FMLA to be completed. She states she spoke with you at her physical about her daughter condition and how she needed FMLA so she can be out of work when daughter has appointments or medical issues. Forms are in your folder please review and fill in highlighted areas,date and sign

## 2019-10-30 NOTE — Telephone Encounter (Signed)
  Shannon Lin In this situation the form is so that the mother can help tend to the daughter's needs.  Even though the daughter is an adult she has intellectual disability as well as mental health issues and physical health issues. The patient herself is not under any specific health issues herself it is more so to help take care of check [facility] when necessary please go ahead and fill out the form as best he can I filled in parts of it as well in my opinion it would be fine for this to be for a full year thank you-Dr. Nicki Reaper

## 2019-10-31 ENCOUNTER — Other Ambulatory Visit (INDEPENDENT_AMBULATORY_CARE_PROVIDER_SITE_OTHER): Payer: Self-pay | Admitting: *Deleted

## 2019-10-31 DIAGNOSIS — R7982 Elevated C-reactive protein (CRP): Secondary | ICD-10-CM

## 2019-11-08 ENCOUNTER — Encounter: Payer: Self-pay | Admitting: Nurse Practitioner

## 2019-11-08 NOTE — Telephone Encounter (Signed)
Left message for patient to call back to the office-need additional information

## 2019-11-08 NOTE — Telephone Encounter (Signed)
Called and left pt a message to return call to get more information

## 2019-11-09 ENCOUNTER — Telehealth: Payer: Self-pay | Admitting: *Deleted

## 2019-11-09 ENCOUNTER — Encounter (INDEPENDENT_AMBULATORY_CARE_PROVIDER_SITE_OTHER): Payer: Self-pay

## 2019-11-09 NOTE — Telephone Encounter (Signed)
Pt states she is not taking pepto and carolyn schedule was full for Friday. Pt advised to go to urgent care or ED if worse. She states she wants to see dr Nicki Reaper Friday since carolyn is full. Appt made. Pt will pick up stool test tomorrow.

## 2019-11-09 NOTE — Telephone Encounter (Signed)
I called and spoke with pt see pt call.

## 2019-11-09 NOTE — Telephone Encounter (Signed)
Please make sure that the patient is not taking in Pepto-Bismol if so she should stop it  Certainly if she gets worse she will need to be seen here earlier or ER or urgent care but Friday appointment would be fine as long as she feels she is hanging in there  I do recommend a IFBOT testing she can pick this up and do the test to bring it back to [place] on Friday if possible

## 2019-11-09 NOTE — Telephone Encounter (Signed)
Stool was black like tar. Has had two stools since yesterday that was black. Felt a pop in her stomach today. States it has been happening for about one month. Every time she lays down or sits down or gets up she hears a pop. No pain other than when she hears that pop. No vomiting, no diarrhea, no fever. Pt declined visit and wanted to wait til Friday to see carolyn.

## 2019-11-11 ENCOUNTER — Ambulatory Visit: Payer: Medicaid Other | Admitting: Family Medicine

## 2019-11-14 ENCOUNTER — Encounter: Payer: Self-pay | Admitting: Nurse Practitioner

## 2019-11-15 ENCOUNTER — Encounter: Payer: Self-pay | Admitting: Nurse Practitioner

## 2019-11-17 ENCOUNTER — Ambulatory Visit (INDEPENDENT_AMBULATORY_CARE_PROVIDER_SITE_OTHER): Payer: Medicaid Other | Admitting: Internal Medicine

## 2019-11-17 ENCOUNTER — Encounter (INDEPENDENT_AMBULATORY_CARE_PROVIDER_SITE_OTHER): Payer: Self-pay | Admitting: Internal Medicine

## 2019-11-17 ENCOUNTER — Other Ambulatory Visit: Payer: Self-pay

## 2019-11-17 DIAGNOSIS — Z8719 Personal history of other diseases of the digestive system: Secondary | ICD-10-CM | POA: Insufficient documentation

## 2019-11-17 DIAGNOSIS — Z860100 Personal history of colon polyps, unspecified: Secondary | ICD-10-CM | POA: Insufficient documentation

## 2019-11-17 DIAGNOSIS — K219 Gastro-esophageal reflux disease without esophagitis: Secondary | ICD-10-CM | POA: Diagnosis not present

## 2019-11-17 NOTE — Patient Instructions (Signed)
Hemoccult to be done if stool is tarry or black.

## 2019-11-17 NOTE — Progress Notes (Signed)
Presenting complaint;  Follow-up for hematochezia and anal fissure.  Database and subjective:  Patient is 29 year old Caucasian female who was evaluated back in October 2020 for hematochezia.  Colonoscopy revealed healing anal fissure.  She also had small polyp removed from transverse colon and was tubular adenoma.  Given her young age colonoscopy was recommended to be repeated in 5 years.  Patient is advised to stay on high-fiber diet and she was to continue Desitin for 2 more weeks. Patient is here for scheduled visit accompanied by her [place]. She says she is doing well.  She has not had any more episodes of hematochezia or anal pain.  She feels her fissure is completely healed.  She is watching her diet.  Her bowels move daily.  She has not had any episode of diarrhea or constipation.  She is taking omeprazole 3-4 times a week.  She says it is working well.  She denies dysphagia nausea or vomiting.  She does complain of passing very dark black stools few weeks ago which lasted for 1 week.  She does not take Pepto-Bismol and also does not take p.o. iron.  She does not take OTC NSAIDs.  No history of peptic ulcer disease.  Current Medications: Outpatient Encounter Medications as of 11/17/2019  Medication Sig  . ARIPiprazole (ABILIFY) 15 MG tablet TAKE 1 TABLET(15 MG) BY MOUTH DAILY (Patient taking differently: Take 15 mg by mouth daily. )  . blood glucose meter kit and supplies Dispense based on patient and insurance preference. Use up to four times daily as directed. (FOR ICD-10 E10.9, E11.9).  . chlorzoxazone (PARAFON FORTE DSC) 500 MG tablet Take 1 tablet (500 mg total) by mouth 2 (two) times daily. Caution drowsiness. Not for frequent use.  . divalproex (DEPAKOTE ER) 500 MG 24 hr tablet TAKE 2 TABLETS BY MOUTH EVERY NIGHT AT BEDTIME (Patient taking differently: Take 1,000 mg by mouth at bedtime. )  . GuanFACINE HCl 3 MG TB24 TAKE 1 TABLET(3 MG) BY MOUTH EVERY MORNING (Patient taking  differently: Take 3 mg by mouth every morning. )  . lamoTRIgine (LAMICTAL) 200 MG tablet TAKE 1 TABLET(200 MG) BY MOUTH DAILY (Patient taking differently: Take 200 mg by mouth at bedtime. )  . metFORMIN (GLUCOPHAGE) 500 MG tablet TAKE 1 TABLET BY MOUTH EVERY DAY WITH SUPPER, IF CAN TOLERATE TAKE TWICE DAILY WITH MEALS (Patient taking differently: Take 500 mg by mouth 2 (two) times daily with a meal. )  . omeprazole (PRILOSEC) 20 MG capsule Take 1 capsule (20 mg total) by mouth daily.  . ondansetron (ZOFRAN ODT) 4 MG disintegrating tablet Take 1 tablet (4 mg total) by mouth every 8 (eight) hours as needed for nausea or vomiting.  . valACYclovir (VALTREX) 1000 MG tablet 2 pills bid for 1 day -fever blister   No facility-administered encounter medications on file as of 11/17/2019.     Objective: Blood pressure 127/84, pulse 97, temperature (!) 97.1 F (36.2 C), temperature source Temporal, height '5\' 1"'$  (1.549 m), weight 215 lb 11.2 oz (97.8 kg). Patient is alert and in no acute distress. She is wearing a facial mask. Conjunctiva is pink. Sclera is nonicteric Oropharyngeal mucosa is normal. No neck masses or thyromegaly noted. Cardiac exam with regular rhythm normal S1 and S2. No murmur or gallop noted. Lungs are clear to auscultation. Abdomen is full.  It is soft and nontender with organomegaly or masses.  No organomegaly or masses noted. No LE edema or clubbing noted.  Assessment:  #1.  History of anal fissure.  She presented with hematochezia and painful defecation.  The symptoms are resolved.  Therefore feel anal fissure has resolved.  No further work-up other than she needs to make sure she does not have constipation and her diarrhea.  #2.  GERD.  She is watching her diet and taking omeprazole 3-4 times a week.  #3.  History of dark stools.  Now she is passing normal stools.  Will do Hemoccult if she has another episode of dark or black stools.  Plan:  Hemoccult x1 patient stool is  dark or black. Office visit on as-needed basis. Next colonoscopy would be in October 2025.

## 2019-12-16 ENCOUNTER — Encounter: Payer: Self-pay | Admitting: Family Medicine

## 2019-12-19 NOTE — Telephone Encounter (Signed)
Left message to return call to get more information 

## 2019-12-19 NOTE — Telephone Encounter (Signed)
Also send her a mychart asking her to call so I could get some more information.

## 2019-12-19 NOTE — Telephone Encounter (Signed)
Nurses Unfortunately freestyle Shannon Lin would not be covered for a diabetic not on insulin who is not a type I diabetic under [place] Medicaid program.  With her A1c back in January under very good control she does not need to check her sugars every day.  She could just check them a couple times a week and that would be easier.

## 2019-12-20 NOTE — Telephone Encounter (Signed)
Spoke with mother who is on dpr and she states she is fine. She gets the numbness when her sugar is high. States sugar is back down and she is not having any numbness today and is doing well. States she will call back if any problems.

## 2020-01-03 IMAGING — CT CT ABD-PELV W/ CM
2 of 4 series · 16 of 46 positions shown, 18 images · IV contrast (Isovue)
Comparison: None.

CLINICAL DATA: Upper abdominal pain and dysuria

EXAM:
CT ABDOMEN AND PELVIS WITH CONTRAST
TECHNIQUE: Multidetector CT imaging of the abdomen and pelvis was performed
using the standard protocol following bolus administration of
intravenous contrast.
CONTRAST:  100mL FPFRL6-SEE IOPAMIDOL (FPFRL6-SEE) INJECTION 61%

[Series 2: axial st · axial · 0.80mm/px · z∈[+689,+1224]mm · 13 of 117 slices shown, 15 images]
[im 5/117  soft-tissue]
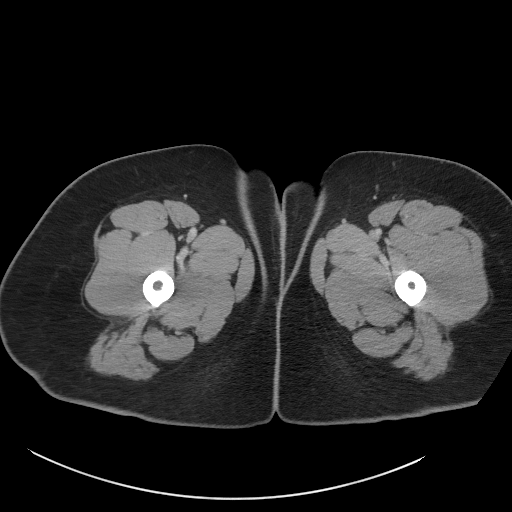
[im 5/117  bone]
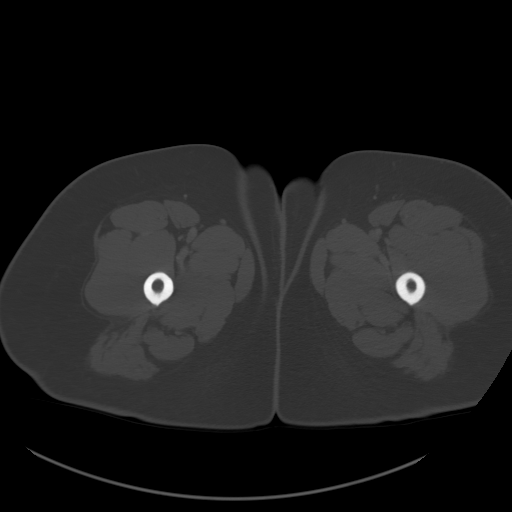
[im 14/117  soft-tissue]
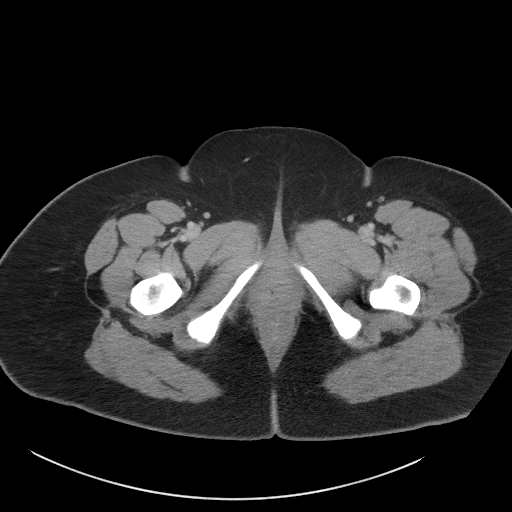
[im 24/117  soft-tissue]
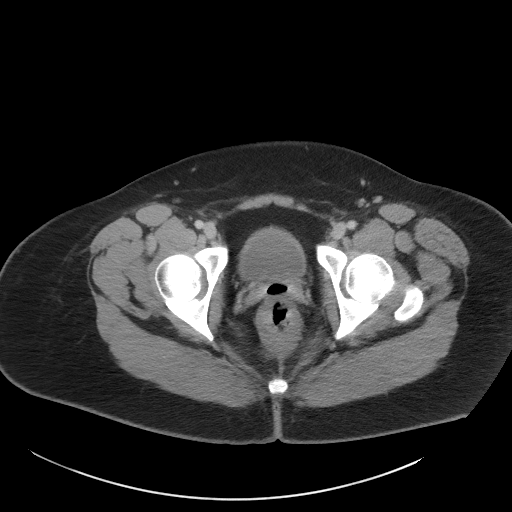
[im 33/117  soft-tissue]
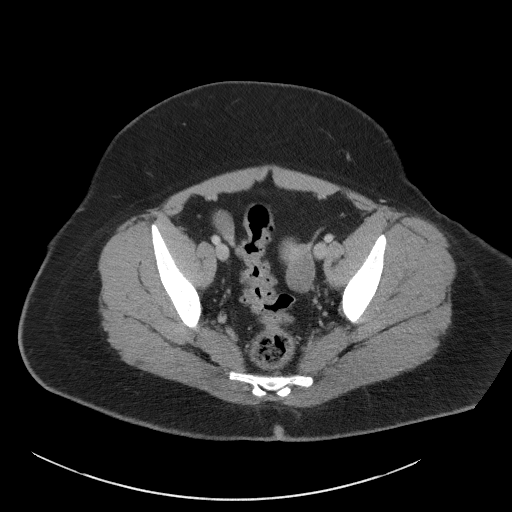
[im 42/117  soft-tissue]
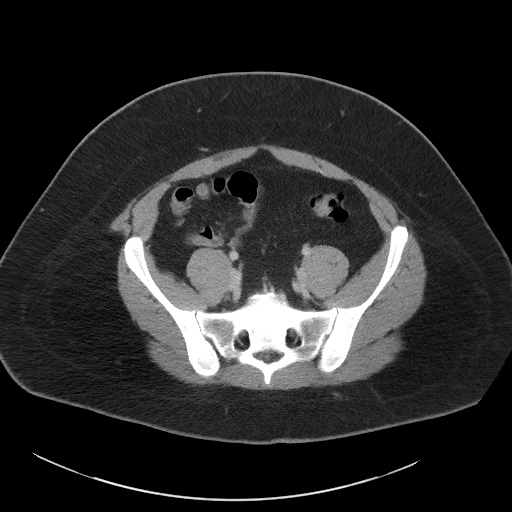
[im 52/117  soft-tissue]
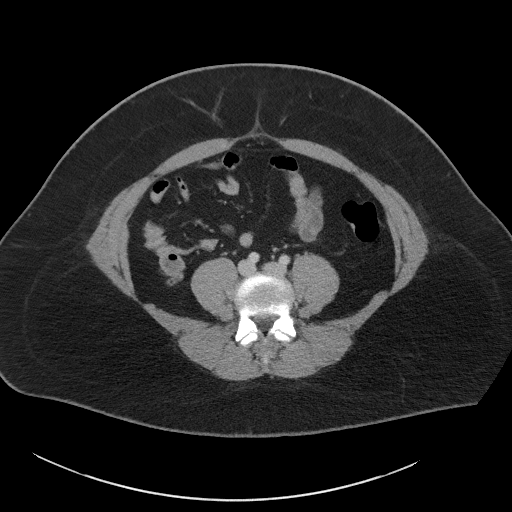
[im 61/117  soft-tissue]
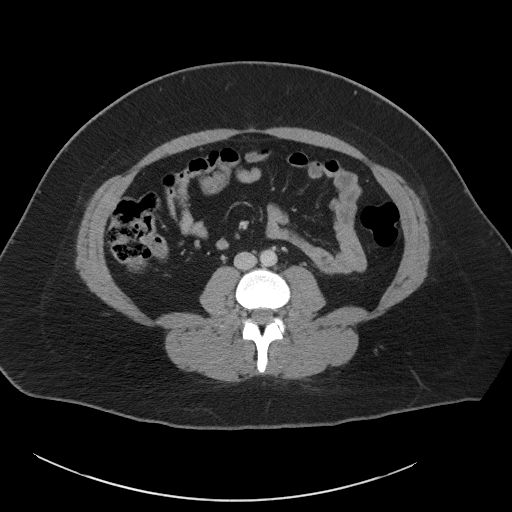
[im 65/117  soft-tissue]
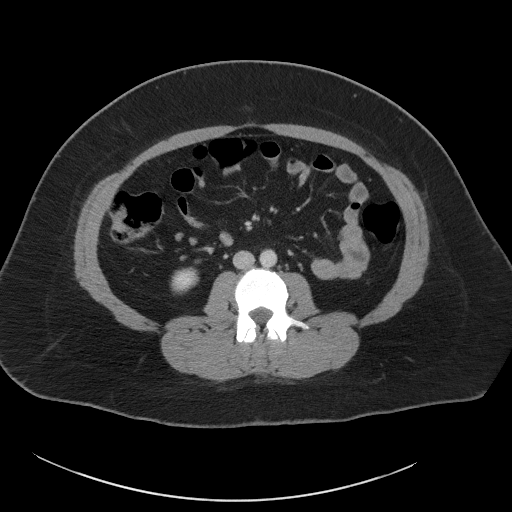
[im 75/117  soft-tissue]
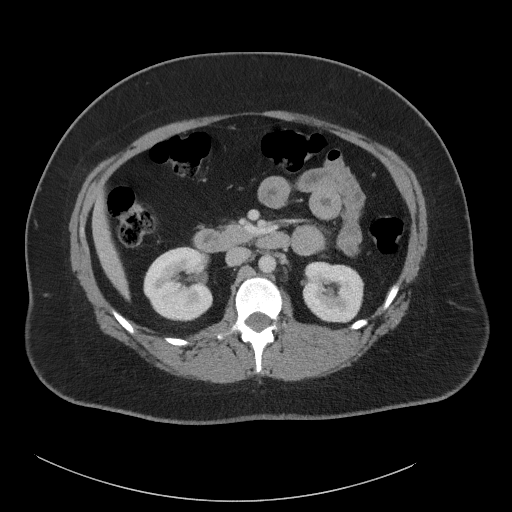
[im 75/117  bone]
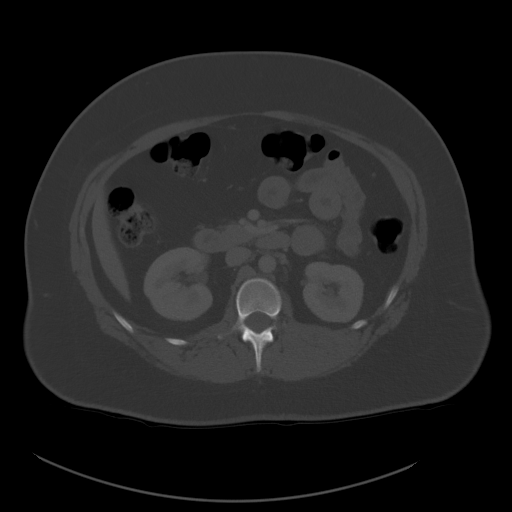
[im 84/117  soft-tissue]
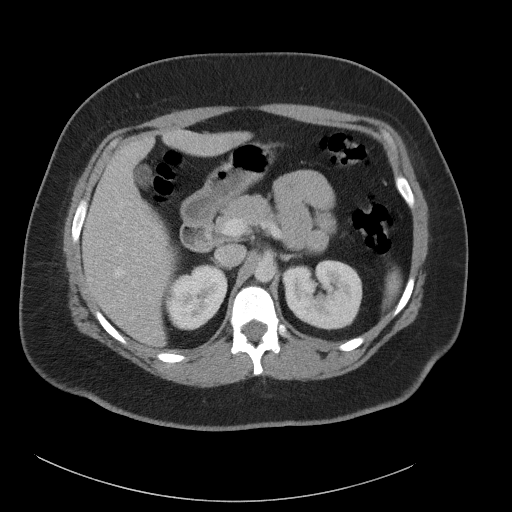
[im 93/117  soft-tissue]
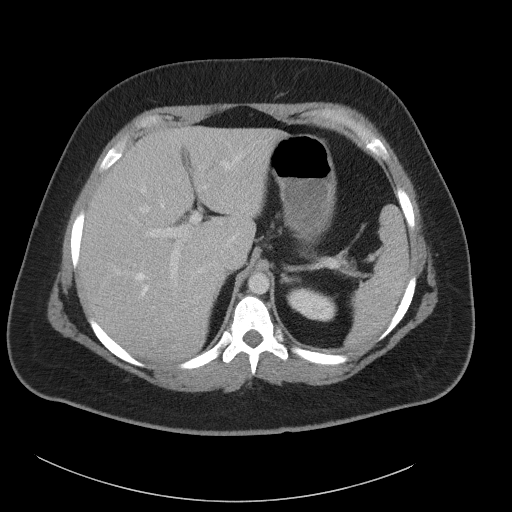
[im 103/117  soft-tissue]
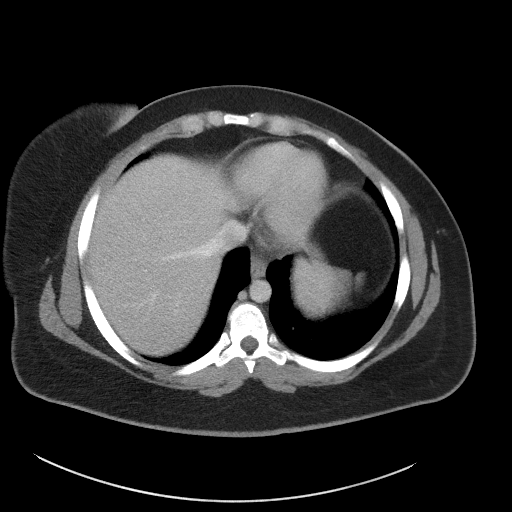
[im 112/117  soft-tissue]
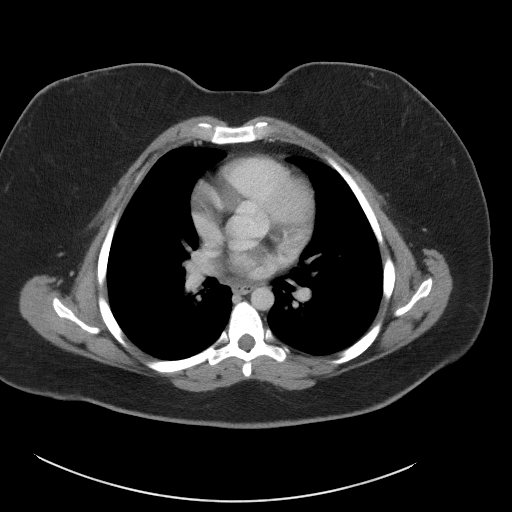

[Series 5: coronal st · coronal · 0.93mm/px · 3 of 112 slices shown]
[im 38/112  soft-tissue]
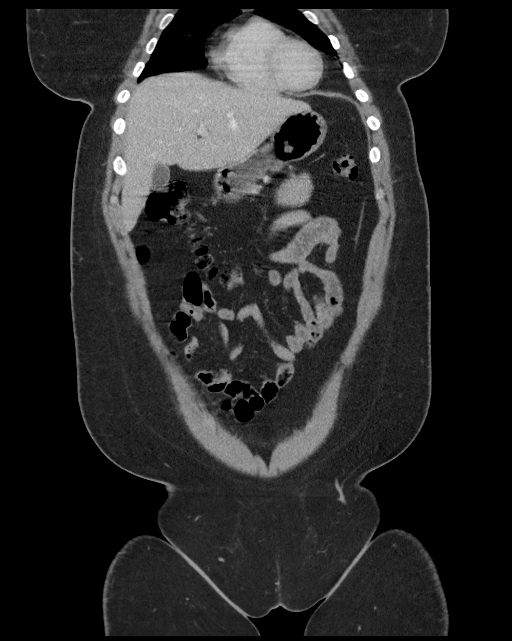
[im 50/112  soft-tissue]
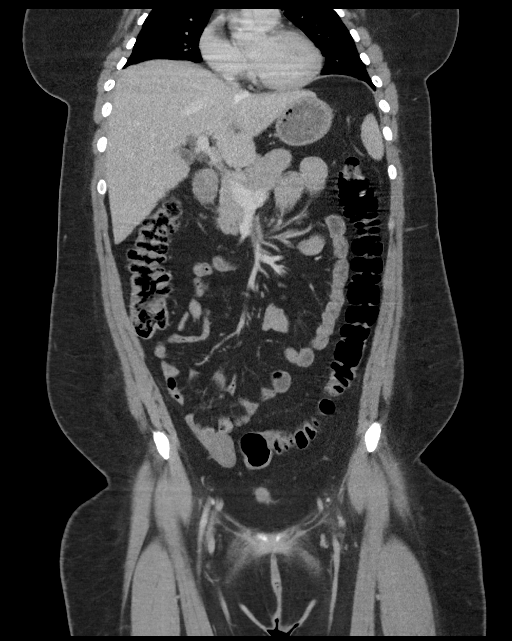
[im 62/112  soft-tissue]
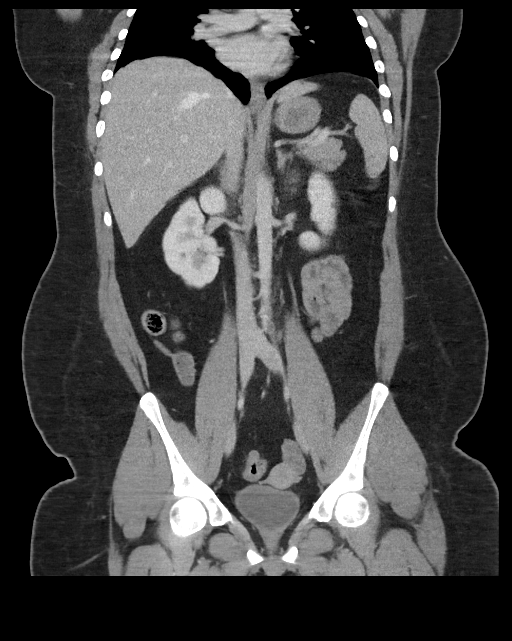

[16 of 46 positions shown; findings below may reference images not displayed]

FINDINGS: Lower chest: Lung bases are clear.

Hepatobiliary: No focal liver lesions are appreciable. The
gallbladder wall does not appear appreciably thickened. There is no
biliary duct dilatation.

Pancreas: No pancreatic mass or inflammatory focus evident.

Spleen: No splenic lesions are appreciable.

Adrenals/Urinary Tract: Adrenals bilaterally appear unremarkable.
Kidneys bilaterally show no evident mass or hydronephrosis on either
side. There is no appreciable renal or ureteral calculus on either
side. Urinary bladder is midline with wall thickness within normal
limits.

Stomach/Bowel: There are sigmoid diverticula without diverticulitis.
Several loops of jejunum show evidence of wall thickening. There is
no appreciable bowel obstruction. No evident free air or portal
venous air.

Vascular/Lymphatic: There is no abdominal aortic aneurysm. No
vascular lesions are evident. There is no adenopathy appreciable in
the abdomen or pelvis.

Reproductive: Uterus is anteverted. There is no appreciable pelvic
mass.

Other: The appendix appears normal. No abscess or ascites is evident
in the abdomen or pelvis.

Musculoskeletal: There are no blastic or lytic bone lesions. There
is no intramuscular or abdominal wall lesion evident.
IMPRESSION: 1. Several loops of jejunum show mild wall thickening. Suspect a
degree of enteritis. No evident bowel obstruction. There are sigmoid
diverticula without diverticulitis.

2.  No evident renal or ureteral calculus.  No hydronephrosis.

3.  No abscess in the abdomen or pelvis.  Appendix appears normal.

4. No gallbladder wall thickening by CT. No pericholecystic fluid
evident by CT.

## 2020-01-09 ENCOUNTER — Encounter: Payer: Self-pay | Admitting: Family Medicine

## 2020-01-10 ENCOUNTER — Emergency Department (HOSPITAL_COMMUNITY)
Admission: EM | Admit: 2020-01-10 | Discharge: 2020-01-10 | Disposition: A | Discharge status: Home / Self Care | Payer: Medicaid Other | Attending: Emergency Medicine | Admitting: Emergency Medicine

## 2020-01-10 ENCOUNTER — Emergency Department (HOSPITAL_COMMUNITY): Payer: Medicaid Other

## 2020-01-10 ENCOUNTER — Other Ambulatory Visit: Payer: Self-pay

## 2020-01-10 ENCOUNTER — Encounter (HOSPITAL_COMMUNITY): Payer: Self-pay | Admitting: Emergency Medicine

## 2020-01-10 DIAGNOSIS — R439 Unspecified disturbances of smell and taste: Secondary | ICD-10-CM | POA: Insufficient documentation

## 2020-01-10 DIAGNOSIS — R42 Dizziness and giddiness: Secondary | ICD-10-CM | POA: Diagnosis not present

## 2020-01-10 DIAGNOSIS — Z79899 Other long term (current) drug therapy: Secondary | ICD-10-CM | POA: Diagnosis not present

## 2020-01-10 DIAGNOSIS — Z20822 Contact with and (suspected) exposure to covid-19: Secondary | ICD-10-CM

## 2020-01-10 DIAGNOSIS — F840 Autistic disorder: Secondary | ICD-10-CM | POA: Diagnosis not present

## 2020-01-10 DIAGNOSIS — F909 Attention-deficit hyperactivity disorder, unspecified type: Secondary | ICD-10-CM | POA: Diagnosis not present

## 2020-01-10 DIAGNOSIS — H81399 Other peripheral vertigo, unspecified ear: Secondary | ICD-10-CM

## 2020-01-10 LAB — SARS CORONAVIRUS 2 (TAT 6-24 HRS): SARS Coronavirus 2: NEGATIVE

## 2020-01-10 MED ORDER — MECLIZINE HCL 12.5 MG PO TABS
25.0000 mg | ORAL_TABLET | Freq: Once | ORAL | Status: AC
  Administered 2020-01-10: 01:00:00 25 mg via ORAL
  Filled 2020-01-10: qty 2

## 2020-01-10 MED ORDER — MECLIZINE HCL 25 MG PO TABS: 25.0000 mg | ORAL_TABLET | Freq: Three times a day (TID) | ORAL | 0 refills | Status: DC | PRN
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
## 2020-01-10 NOTE — ED Triage Notes (Signed)
Pt c/o loss of taste and smell, dizziness, and a cough. Pt recently traveled to [place] with family last week, no known exposures.

## 2020-01-16 ENCOUNTER — Telehealth: Payer: Self-pay | Admitting: Family Medicine

## 2020-01-16 NOTE — Telephone Encounter (Signed)
Pt was seen in ER last week. Pt was dizzy at ER. Non productive cough. No fever. No headache. Hacking cough when laying down. COVID test was negative in hospital. Pt has been taking Dayquil OTC and some cough tablets OTC. [facility] [address] Please advise. Thank you.   CB # [id number]

## 2020-01-16 NOTE — Telephone Encounter (Signed)
I recommend Delsym if ongoing troubles I recommend a follow-up office visit either with myself or with Dr. Lovena Le

## 2020-01-16 NOTE — Telephone Encounter (Signed)
Patient is requesting antibiotic for cough and runny nose. She has had Covid test and it was negative but cant get rid of cough and runny nose. [facility] Please advise

## 2020-01-17 NOTE — Telephone Encounter (Signed)
Patient notified and verbalized understanding. 

## 2020-01-28 ENCOUNTER — Encounter: Payer: Self-pay | Admitting: Nurse Practitioner

## 2020-01-30 ENCOUNTER — Telehealth: Payer: Self-pay | Admitting: *Deleted

## 2020-01-30 NOTE — Telephone Encounter (Signed)
Pt states every time she eats she feels a pop in the middle of stomach. Happens while eating. Started one week ago. No fever, no nausea, no vomiting. No pain. Pt asked that message go to carolyn and she was aware carolyn is only in the office on [address].

## 2020-01-31 ENCOUNTER — Encounter: Payer: Self-pay | Admitting: Family Medicine

## 2020-01-31 NOTE — Telephone Encounter (Signed)
Called and discussed with pt's mother Jenny Reichmann who is on dpr.

## 2020-01-31 NOTE — Telephone Encounter (Signed)
Please tell Shannon Lin that she should let Dr. Laural Golden know. He sees her for her GI issues. She last saw him in February and this is a new problem. Thanks.

## 2020-02-03 ENCOUNTER — Encounter: Payer: Self-pay | Admitting: Nurse Practitioner

## 2020-02-03 ENCOUNTER — Ambulatory Visit (INDEPENDENT_AMBULATORY_CARE_PROVIDER_SITE_OTHER): Payer: Medicaid Other | Admitting: Nurse Practitioner

## 2020-02-03 ENCOUNTER — Other Ambulatory Visit: Payer: Self-pay

## 2020-02-03 VITALS — BP 110/78 | Temp 97.3°F | Wt 210.8 lb

## 2020-02-03 DIAGNOSIS — Z113 Encounter for screening for infections with a predominantly sexual mode of transmission: Secondary | ICD-10-CM | POA: Diagnosis not present

## 2020-02-03 DIAGNOSIS — R198 Other specified symptoms and signs involving the digestive system and abdomen: Secondary | ICD-10-CM

## 2020-02-03 LAB — POCT URINE PREGNANCY: Preg Test, Ur: NEGATIVE

## 2020-02-03 LAB — POCT URINALYSIS DIPSTICK (MANUAL)
Nitrite, UA: NEGATIVE
Poct Blood: NEGATIVE
Spec Grav, UA: >=1.03 — AB (ref 1.010–1.025)
pH, UA: 5 (ref 5.0–8.0)

## 2020-02-03 NOTE — Patient Instructions (Signed)
Please see your local pharmacy to obtain tetanus shot Please return in September for flu shot or see local pharmacy this fall for Flu shot.

## 2020-02-03 NOTE — Progress Notes (Signed)
   Subjective:    Patient ID: Shannon Lin, female    DOB: [birth year], 29 y.o.   MRN: [id number]  HPI Patient comes in today with complaints of something moving around in her stomach.  Patient states she does not have any pain or discomfort but does have to have BM shortly after meals.    Review of Systems     Objective:   Physical Exam        Assessment & Plan:
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
## 2020-02-16 ENCOUNTER — Encounter: Payer: Self-pay | Admitting: Nurse Practitioner

## 2020-02-19 ENCOUNTER — Encounter: Payer: Self-pay | Admitting: Nurse Practitioner

## 2020-02-22 ENCOUNTER — Encounter: Payer: Self-pay | Admitting: Family Medicine

## 2020-02-23 ENCOUNTER — Other Ambulatory Visit: Payer: Self-pay | Admitting: Nurse Practitioner

## 2020-02-23 NOTE — Telephone Encounter (Signed)
Please discuss with patient. This is not on her med list. Thanks.

## 2020-02-24 NOTE — Telephone Encounter (Signed)
Did we figure out whether she is taking this med? Thanks.

## 2020-02-28 NOTE — Telephone Encounter (Signed)
Sorry to blow up your email. I checked. The last time I ordered this was 2019. Thanks.

## 2020-02-28 NOTE — Telephone Encounter (Signed)
Are they waiting for me to fill this? Not sure who has been prescribing since not on her med list. Thanks, [name]

## 2020-03-20 ENCOUNTER — Encounter: Payer: Self-pay | Admitting: Family Medicine

## 2020-04-13 ENCOUNTER — Encounter: Payer: Self-pay | Admitting: Nurse Practitioner

## 2020-04-20 ENCOUNTER — Ambulatory Visit: Payer: Medicaid Other | Admitting: Nurse Practitioner

## 2020-04-23 ENCOUNTER — Encounter: Payer: Self-pay | Admitting: Nurse Practitioner

## 2020-04-23 NOTE — Telephone Encounter (Signed)
Pt has sent recent my chart

## 2020-05-03 ENCOUNTER — Telehealth (INDEPENDENT_AMBULATORY_CARE_PROVIDER_SITE_OTHER): Payer: Medicaid Other | Admitting: Nurse Practitioner

## 2020-05-03 ENCOUNTER — Other Ambulatory Visit: Payer: Self-pay

## 2020-05-03 ENCOUNTER — Encounter: Payer: Self-pay | Admitting: Nurse Practitioner

## 2020-05-03 DIAGNOSIS — G43009 Migraine without aura, not intractable, without status migrainosus: Secondary | ICD-10-CM | POA: Diagnosis not present

## 2020-05-03 MED ORDER — RIZATRIPTAN BENZOATE 10 MG PO TBDP: 10.0000 mg | ORAL_TABLET | ORAL | 0 refills | Status: DC | PRN

## 2020-05-03 NOTE — Progress Notes (Addendum)
  Video visit Subjective:    Patient ID: Shannon Lin, female    DOB: [birth year], 29 y.o.   MRN: [id number]  HPI Patient calls in today with complaints of a headache x one week with nausea that comes and goes.  Patient has tried Advil with no relief. Starts at the back of the head and goes to the front. Describes a throbbing pain. Usually occurs in the evenings. Lasts about 30 minutes. Photosensitivity. No phonophobia. Better with warm or cold compresses on the head or with rest. Slight relief with OTC Tylenol but no NSAIDs. Nausea, no vomiting. Dizziness. No numbness or weakness of the face, arms or legs. No difficulty speaking or swallowing. No aura. No visual changes. No previous history of migraines. [place]: her sister has migraines. Minimal caffeine intake. Only trigger identified is stress. Her grandfather just had a stroke and was in the hospital. Had a normal eye exam last month. Has Nexplanon for birth control.    Review of Systems     Objective:   Physical Exam NAD.  Alert, oriented.  Cheerful affect.  Making good eye contact.  Thoughts logical coherent and relevant.       Assessment & Plan:   Problem List Items Addressed This Visit      Cardiovascular and Mediastinum   Migraine without aura and without status migrainosus, not intractable - Primary   Relevant Medications   rizatriptan (MAXALT-MLT) 10 MG disintegrating tablet     Meds ordered this encounter  Medications  . rizatriptan (MAXALT-MLT) 10 MG disintegrating tablet    Sig: Take 1 tablet (10 mg total) by mouth as needed for migraine. May repeat in 2 hours if needed; max 2 per 24 hours    Dispense:  10 tablet    Refill:  0    Order Specific Question:   Supervising Provider    Answer:   Kathyrn Drown [[id number]]   Patient to use an app to track her headaches and bring to next visit. Warning signs reviewed regarding headaches.  Call the office or go to ED sooner if any problems. Call back in 2 weeks if no  improvement with Maxalt. Return in about 1 month (around 06/03/2020) for Recheck headaches. Virtual Visit via Video Note  I connected with Shannon Lin on 05/03/20 at  9:20 AM EDT by a video enabled telemedicine application and verified that I am speaking with the correct person using two identifiers.  Location: Patient: home Provider: office   I discussed the limitations of evaluation and management by telemedicine and the availability of in person appointments. The patient expressed understanding and agreed to proceed.  History of Present Illness: See note.    Observations/Objective:   Assessment and Plan:   Follow Up Instructions:    I discussed the assessment and treatment plan with the patient. The patient was provided an opportunity to ask questions and all were answered. The patient agreed with the plan and demonstrated an understanding of the instructions.   The patient was advised to call back or seek an in-person evaluation if the symptoms worsen or if the condition fails to improve as anticipated.  I provided 15 minutes of non-face-to-face time during this encounter.   Nilda Simmer, NP

## 2020-05-15 ENCOUNTER — Telehealth: Payer: Self-pay | Admitting: Family Medicine

## 2020-05-15 DIAGNOSIS — N83209 Unspecified ovarian cyst, unspecified side: Secondary | ICD-10-CM

## 2020-05-15 DIAGNOSIS — R109 Unspecified abdominal pain: Secondary | ICD-10-CM

## 2020-05-15 NOTE — Telephone Encounter (Signed)
I recommend a pelvic ultrasound to look at the ovaries due to ovarian cyst and lower abdominal pain If family feels she needs to be seen sooner she can be seen by Shannon Lin within the next several days Otherwise I recommend the ultrasound and we can review the records when they come in and patient can keep follow-up visit with [name] June 01, 2020

## 2020-05-15 NOTE — Telephone Encounter (Signed)
Pt has appt scheduled with [name] on 06/01/20 for one month follow up. Please advise. Thank you

## 2020-05-15 NOTE — Telephone Encounter (Signed)
Error

## 2020-05-15 NOTE — Telephone Encounter (Signed)
1.  Please try to get records sent to me please 2.  How is the patient doing currently in regards to pain vomiting?

## 2020-05-15 NOTE — Telephone Encounter (Signed)
Pt mom contacted. Mom states that pt does not have any more vomiting. Pt mom states pain is now a 4 whereas last night it was a 7 or 8. Have faxed over request for records.

## 2020-05-15 NOTE — Telephone Encounter (Signed)
Pt was taking her to [facility] ER was given zofran, she was throwing up and pain right side down to her pelvic. ER said she has a cyst on her ovary. Pt was told to follow up with her primary doctor. They did a cat scan apendex and gallbladder was fine. When should Pt follow up mom called and wanted to know if she should come in or wait?   Mom call back [phone]

## 2020-05-16 DIAGNOSIS — Z029 Encounter for administrative examinations, unspecified: Secondary | ICD-10-CM

## 2020-05-16 NOTE — Telephone Encounter (Signed)
Pt mom contacted. Mom states that pt is now having pain on left side. Pain is about a 5 on a scale from 1-10. Informed patient that we will schedule the pelvic ultrasound and let her know when appt is. Informed mom that if she is having new abd pain that she would need to go back to ED or Urgent Care, mom would like to have appt with Santiago Glad. Mom transferred up front to set up appt.

## 2020-05-16 NOTE — Telephone Encounter (Signed)
[place] ordered. Left message to return call

## 2020-05-17 ENCOUNTER — Telehealth: Payer: Self-pay | Admitting: Family Medicine

## 2020-05-17 ENCOUNTER — Encounter: Payer: Self-pay | Admitting: Family Medicine

## 2020-05-17 ENCOUNTER — Ambulatory Visit (INDEPENDENT_AMBULATORY_CARE_PROVIDER_SITE_OTHER): Payer: Medicaid Other | Admitting: Family Medicine

## 2020-05-17 ENCOUNTER — Other Ambulatory Visit: Payer: Self-pay

## 2020-05-17 VITALS — BP 122/74 | HR 104 | Temp 97.8°F | Ht 61.0 in | Wt 222.2 lb

## 2020-05-17 DIAGNOSIS — R1010 Upper abdominal pain, unspecified: Secondary | ICD-10-CM | POA: Insufficient documentation

## 2020-05-17 DIAGNOSIS — R1012 Left upper quadrant pain: Secondary | ICD-10-CM | POA: Insufficient documentation

## 2020-05-17 DIAGNOSIS — R109 Unspecified abdominal pain: Secondary | ICD-10-CM | POA: Insufficient documentation

## 2020-05-17 LAB — POCT URINALYSIS DIPSTICK (MANUAL)
Leukocytes, UA: NEGATIVE
Nitrite, UA: NEGATIVE
Poct Blood: NEGATIVE
Poct Glucose: NORMAL mg/dL
Poct Ketones: NEGATIVE
Poct Protein: NEGATIVE mg/dL
Poct Urobilinogen: NORMAL mg/dL
Spec Grav, UA: 1.025 (ref 1.010–1.025)
pH, UA: 5 (ref 5.0–8.0)

## 2020-05-17 NOTE — Patient Instructions (Signed)
Preventing Unhealthy Weight Gain, Adult Staying at a healthy weight is important to your overall health. When fat builds up in your body, you may become overweight or obese. Being overweight or obese increases your risk of developing certain health problems, such as heart disease, diabetes, sleeping problems, joint problems, and some types of cancer. Unhealthy weight gain is often the result of making unhealthy food choices or not getting enough exercise. You can make changes to your lifestyle to prevent obesity and stay as healthy as possible. What nutrition changes can be made?   Eat only as much as your body needs. To do this: ? Pay attention to signs that you are hungry or full. Stop eating as soon as you feel full. ? If you feel hungry, try drinking water first before eating. Drink enough water so your urine is clear or pale yellow. ? Eat smaller portions. Pay attention to portion sizes when eating out. ? Look at serving sizes on food labels. Most foods contain more than one serving per container. ? Eat the recommended number of calories for your gender and activity level. For most active people, a daily total of 2,000 calories is appropriate. If you are trying to lose weight or are not very active, you may need to eat fewer calories. Talk with your health care provider or a diet and nutrition specialist (dietitian) about how many calories you need each day.  Choose healthy foods, such as: ? Fruits and vegetables. At each meal, try to fill at least half of your plate with fruits and vegetables. ? Whole grains, such as whole-wheat bread, brown rice, and quinoa. ? Lean meats, such as chicken or fish. ? Other healthy proteins, such as beans, eggs, or tofu. ? Healthy fats, such as nuts, seeds, fatty fish, and olive oil. ? Low-fat or fat-free dairy products.  Check food labels, and avoid food and drinks that: ? Are high in calories. ? Have added sugar. ? Are high in sodium. ? Have saturated  fats or trans fats.  Cook foods in healthier ways, such as by baking, broiling, or grilling.  Make a meal plan for the week, and shop with a grocery list to help you stay on track with your purchases. Try to avoid going to the grocery store when you are hungry.  When grocery shopping, try to shop around the outside of the store first, where the fresh foods are. Doing this helps you to avoid prepackaged foods, which can be high in sugar, salt (sodium), and fat. What lifestyle changes can be made?   Exercise for 30 or more minutes on 5 or more days each week. Exercising may include brisk walking, yard work, biking, running, swimming, and team sports like basketball and soccer. Ask your health care provider which exercises are safe for you.  Do muscle-strengthening activities, such as lifting weights or using resistance bands, on 2 or more days a week.  Do not use any products that contain nicotine or tobacco, such as cigarettes and e-cigarettes. If you need help quitting, ask your health care provider.  Limit alcohol intake to no more than 1 drink a day for nonpregnant women and 2 drinks a day for men. One drink equals 12 oz of beer, 5 oz of wine, or 1 oz of hard liquor.  Try to get 7-9 hours of sleep each night. What other changes can be made?  Keep a food and activity journal to keep track of: ? What you ate and how many calories   you had. Remember to count the calories in sauces, dressings, and side dishes. ? Whether you were active, and what exercises you did. ? Your calorie, weight, and activity goals.  Check your weight regularly. Track any changes. If you notice you have gained weight, make changes to your diet or activity routine.  Avoid taking weight-loss medicines or supplements. Talk to your health care provider before starting any new medicine or supplement.  Talk to your health care provider before trying any new diet or exercise plan. Why are these changes  important? Eating healthy, staying active, and having healthy habits can help you to prevent obesity. Those changes also:  Help you manage stress and emotions.  Help you connect with friends and family.  Improve your self-esteem.  Improve your sleep.  Prevent long-term health problems. What can happen if changes are not made? Being obese or overweight can cause you to develop joint or bone problems, which can make it hard for you to stay active or do activities you enjoy. Being obese or overweight also puts stress on your heart and lungs and can lead to health problems like diabetes, heart disease, and some cancers. Where to find more information Talk with your health care provider or a dietitian about healthy eating and healthy lifestyle choices. You may also find information from:  U.S. Department of Agriculture, MyPlate: www.choosemyplate.gov  American Heart Association: www.heart.org  Centers for Disease Control and Prevention: www.cdc.gov Summary  Staying at a healthy weight is important to your overall health. It helps you to prevent certain diseases and health problems, such as heart disease, diabetes, joint problems, sleep disorders, and some types of cancer.  Being obese or overweight can cause you to develop joint or bone problems, which can make it hard for you to stay active or do activities you enjoy.  You can prevent unhealthy weight gain by eating a healthy diet, exercising regularly, not smoking, limiting alcohol, and getting enough sleep.  Talk with your health care provider or a dietitian for guidance about healthy eating and healthy lifestyle choices. This information is not intended to replace advice given to you by your health care provider. Make sure you discuss any questions you have with your health care provider. Document Revised: 09/25/2017 Document Reviewed: 10/29/2016 Elsevier Patient Education  2020 Elsevier Inc.  

## 2020-05-17 NOTE — Telephone Encounter (Signed)
Mom- Shannon Lin brought in FMLA to be completed on patient when she was in hospital at [facility]. Records from [facility] are attached  to form for you to review and help fill out form. In your folder
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
## 2020-05-18 NOTE — Telephone Encounter (Signed)
In order for me to fill out this form I will need the following questions asked of the family Is this form for charity or for her mother? According to what was filled and they are only missing August 9 and August 10 and expected return to work August 11? Was patient admitted or just treated and released? Did the ER prescribed any medications?  If this form is for the mother was that because she had to be with charity while she was at the hospital and afterwards? These further details allows me to fill in the form.  I am keeping this form on my desk until further information comes thank you

## 2020-05-19 LAB — CHLAMYDIA/GONOCOCCUS/TRICHOMONAS, NAA
Chlamydia by NAA: NEGATIVE
Gonococcus by NAA: NEGATIVE
Trich vag by NAA: NEGATIVE

## 2020-05-21 ENCOUNTER — Other Ambulatory Visit: Payer: Self-pay

## 2020-05-21 ENCOUNTER — Ambulatory Visit (HOSPITAL_COMMUNITY)
Admission: RE | Admit: 2020-05-21 | Discharge: 2020-05-21 | Disposition: A | Discharge status: Home / Self Care | Payer: Medicaid Other | Source: Ambulatory Visit | Attending: Family Medicine | Admitting: Family Medicine

## 2020-05-21 DIAGNOSIS — R1030 Lower abdominal pain, unspecified: Secondary | ICD-10-CM | POA: Diagnosis not present

## 2020-05-21 DIAGNOSIS — N83292 Other ovarian cyst, left side: Secondary | ICD-10-CM | POA: Diagnosis not present

## 2020-05-21 DIAGNOSIS — N83209 Unspecified ovarian cyst, unspecified side: Secondary | ICD-10-CM | POA: Diagnosis present

## 2020-05-22 ENCOUNTER — Telehealth: Payer: Self-pay | Admitting: Family Medicine

## 2020-05-22 ENCOUNTER — Other Ambulatory Visit: Payer: Self-pay | Admitting: *Deleted

## 2020-05-22 ENCOUNTER — Other Ambulatory Visit: Payer: Self-pay | Admitting: Family Medicine

## 2020-05-22 DIAGNOSIS — N83209 Unspecified ovarian cyst, unspecified side: Secondary | ICD-10-CM

## 2020-05-22 NOTE — Telephone Encounter (Signed)
Mom has questions about the ultrasound results.

## 2020-05-25 ENCOUNTER — Telehealth: Payer: Self-pay | Admitting: Family Medicine

## 2020-05-25 NOTE — Telephone Encounter (Signed)
FMLA form regarding this patient/her mother Shannon Lin Has been completed Last patient requires Cindy's signature I will bring this form back by the office late this afternoon thank you

## 2020-05-29 ENCOUNTER — Other Ambulatory Visit: Payer: Self-pay | Admitting: Adult Health

## 2020-05-29 DIAGNOSIS — N838 Other noninflammatory disorders of ovary, fallopian tube and broad ligament: Secondary | ICD-10-CM

## 2020-05-29 NOTE — Progress Notes (Signed)
Has mass on ovary has appt 8/30 will check CA 125 before then

## 2020-05-31 NOTE — Telephone Encounter (Signed)
Forms has been faxed over

## 2020-06-01 ENCOUNTER — Other Ambulatory Visit: Payer: Self-pay

## 2020-06-01 ENCOUNTER — Encounter: Payer: Self-pay | Admitting: Nurse Practitioner

## 2020-06-01 ENCOUNTER — Ambulatory Visit (INDEPENDENT_AMBULATORY_CARE_PROVIDER_SITE_OTHER): Payer: Medicaid Other | Admitting: Nurse Practitioner

## 2020-06-01 ENCOUNTER — Encounter: Payer: Self-pay | Admitting: Family Medicine

## 2020-06-01 VITALS — BP 122/68 | HR 95 | Temp 97.4°F | Wt 220.4 lb

## 2020-06-01 DIAGNOSIS — G43009 Migraine without aura, not intractable, without status migrainosus: Secondary | ICD-10-CM | POA: Diagnosis not present

## 2020-06-01 NOTE — Progress Notes (Signed)
° °  Subjective:    Patient ID: Shannon Lin, female    DOB: 11/09/1990, 29 y.o.   MRN: [id number]  HPI Pt here for one month follow up. Pt has been having migraines but was prescribed Maxalt and that has been working great. Pt is taking Maxalt at night due to making her sleepy at night. Pt also having lab work for [employer] due to have an appointment Monday. Follow up for a complex left ovarian cyst. Has Nexplanon for birth control. No bleeding for several months. Inserted in 2019.  Presents today for recheck on her migraines. See previous note.  Description of the headaches remain the same, now occurring about every other day, relieved with Maxalt which she takes at nighttime.  On opposite days takes 1 dose of ibuprofen.  Has sensitivity to sound but no longer has sensitivity to light.  Has not identified any specific triggers.  No visual changes.  No numbness or weakness of the face arms or legs.  Better with a cold compress to her forehead and rest in a dark room. States Maxalt is working very well.   Review of Systems     Objective:   Physical Exam NAD.  Alert, oriented.  Lungs clear.  Heart regular rate rhythm.  Cheerful affect.  Making good eye contact.  Dressed appropriately. Speech clear. Gait normal.        Assessment & Plan:   Problem List Items Addressed This Visit      Cardiovascular and Mediastinum   Migraine without aura and without status migrainosus, not intractable - Primary     Meds ordered this encounter  Medications   propranolol ER (INDERAL LA) 80 MG 24 hr capsule    Sig: Take 1 capsule (80 mg total) by mouth daily. To prevent migraine    Dispense:  30 capsule    Refill:  2    Order Specific Question:   Supervising Provider    Answer:   Sallee Lange A [[id number]]   A review of her medications and check for interactions was performed after visit. Propranolol ER 80 mg prescribed. Explanation to patient that the goal is to reduce her use of Maxalt to  about twice a month. Concerned that her overuse of Maxalt may be causing some rebound migraines.  Will send a note to patient regarding new medicine. DC med and contact office if any problems.  Otherwise follow up in one month.

## 2020-06-02 ENCOUNTER — Encounter: Payer: Self-pay | Admitting: Nurse Practitioner

## 2020-06-02 MED ORDER — PROPRANOLOL HCL ER 80 MG PO CP24: 80.0000 mg | ORAL_CAPSULE | Freq: Every day | ORAL | 2 refills | Status: DC

## 2020-06-04 ENCOUNTER — Encounter: Payer: Self-pay | Admitting: Adult Health

## 2020-06-04 ENCOUNTER — Ambulatory Visit (INDEPENDENT_AMBULATORY_CARE_PROVIDER_SITE_OTHER): Payer: Medicaid Other | Admitting: Adult Health

## 2020-06-04 VITALS — BP 102/70 | HR 84 | Ht 61.0 in | Wt 220.3 lb

## 2020-06-04 DIAGNOSIS — N83202 Unspecified ovarian cyst, left side: Secondary | ICD-10-CM | POA: Diagnosis not present
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
## 2020-06-05 ENCOUNTER — Encounter: Payer: Self-pay | Admitting: Family Medicine

## 2020-06-05 ENCOUNTER — Telehealth: Payer: Self-pay | Admitting: Adult Health

## 2020-06-05 LAB — CA 125: Cancer Antigen (CA) 125: 7.5 U/mL (ref 0.0–38.1)

## 2020-06-05 NOTE — Telephone Encounter (Signed)
Patient would like JG to review her results with her from labs this week.

## 2020-06-05 NOTE — Telephone Encounter (Signed)
Pt aware CA125 was within the normal range. Pt voiced understanding. Strang

## 2020-06-06 ENCOUNTER — Ambulatory Visit (INDEPENDENT_AMBULATORY_CARE_PROVIDER_SITE_OTHER): Payer: Medicaid Other | Admitting: Family Medicine

## 2020-06-06 ENCOUNTER — Encounter: Payer: Self-pay | Admitting: Family Medicine

## 2020-06-06 ENCOUNTER — Other Ambulatory Visit: Payer: Self-pay

## 2020-06-06 VITALS — BP 122/82 | HR 86 | Temp 97.3°F | Ht 73.0 in | Wt 221.8 lb

## 2020-06-06 DIAGNOSIS — J019 Acute sinusitis, unspecified: Secondary | ICD-10-CM | POA: Insufficient documentation

## 2020-06-06 MED ORDER — AMOXICILLIN-POT CLAVULANATE 875-125 MG PO TABS: 1.0000 | ORAL_TABLET | Freq: Two times a day (BID) | ORAL | 0 refills | Status: DC

## 2020-06-06 NOTE — Patient Instructions (Signed)
Take all of your antibiotic. In addition to the antibiotic, follow the advice below to help you through this infection.  1) Get lots of rest.  2) Take over the counter pain medication if needed, such as acetaminophen or ibuprofen. Read and follow instructions on the label and make sure not to combine other medications that may have same ingredients in it. It is important to not take too much of these ingredients.  3) Drink plenty of caffeine-free fluids. (If you have heart or kidney problems, follow the instructions of your specialist regarding amounts).  4) If you are hungry, eat a bland diet, such as the BRAT diet (bananas, rice, applesauce, toast).  5) Let us know if you are not feeling better in a week.         Sinusitis, Adult Sinusitis is soreness and swelling (inflammation) of your sinuses. Sinuses are hollow spaces in the bones around your face. They are located:  Around your eyes.  In the middle of your forehead.  Behind your nose.  In your cheekbones. Your sinuses and nasal passages are lined with a fluid called mucus. Mucus drains out of your sinuses. Swelling can trap mucus in your sinuses. This lets germs (bacteria, virus, or fungus) grow, which leads to infection. Most of the time, this condition is caused by a virus. What are the causes? This condition is caused by:  Allergies.  Asthma.  Germs.  Things that block your nose or sinuses.  Growths in the nose (nasal polyps).  Chemicals or irritants in the air.  Fungus (rare). What increases the risk? You are more likely to develop this condition if:  You have a weak body defense system (immune system).  You do a lot of swimming or diving.  You use nasal sprays too much.  You smoke. What are the signs or symptoms? The main symptoms of this condition are pain and a feeling of pressure around the sinuses. Other symptoms include:  Stuffy nose (congestion).  Runny nose (drainage).  Swelling and  warmth in the sinuses.  Headache.  Toothache.  A cough that may get worse at night.  Mucus that collects in the throat or the back of the nose (postnasal drip).  Being unable to smell and taste.  Being very tired (fatigue).  A fever.  Sore throat.  Bad breath. How is this diagnosed? This condition is diagnosed based on:  Your symptoms.  Your medical history.  A physical exam.  Tests to find out if your condition is short-term (acute) or long-term (chronic). Your doctor may: ? Check your nose for growths (polyps). ? Check your sinuses using a tool that has a light (endoscope). ? Check for allergies or germs. ? Do imaging tests, such as an MRI or CT scan. How is this treated? Treatment for this condition depends on the cause and whether it is short-term or long-term.  If caused by a virus, your symptoms should go away on their own within 10 days. You may be given medicines to relieve symptoms. They include: ? Medicines that shrink swollen tissue in the nose. ? Medicines that treat allergies (antihistamines). ? A spray that treats swelling of the nostrils. ? Rinses that help get rid of thick mucus in your nose (nasal saline washes).  If caused by bacteria, your doctor may wait to see if you will get better without treatment. You may be given antibiotic medicine if you have: ? A very bad infection. ? A weak body defense system.  If caused  by growths in the nose, you may need to have surgery. Follow these instructions at home: Medicines  Take, use, or apply over-the-counter and prescription medicines only as told by your doctor. These may include nasal sprays.  If you were prescribed an antibiotic medicine, take it as told by your doctor. Do not stop taking the antibiotic even if you start to feel better. Hydrate and humidify   Drink enough water to keep your pee (urine) pale yellow.  Use a cool mist humidifier to keep the humidity level in your home above  50%.  Breathe in steam for 10-15 minutes, 3-4 times a day, or as told by your doctor. You can do this in the bathroom while a hot shower is running.  Try not to spend time in cool or dry air. Rest  Rest as much as you can.  Sleep with your head raised (elevated).  Make sure you get enough sleep each night. General instructions   Put a warm, moist washcloth on your face 3-4 times a day, or as often as told by your doctor. This will help with discomfort.  Wash your hands often with soap and water. If there is no soap and water, use hand sanitizer.  Do not smoke. Avoid being around people who are smoking (secondhand smoke).  Keep all follow-up visits as told by your doctor. This is important. Contact a doctor if:  You have a fever.  Your symptoms get worse.  Your symptoms do not get better within 10 days. Get help right away if:  You have a very bad headache.  You cannot stop throwing up (vomiting).  You have very bad pain or swelling around your face or eyes.  You have trouble seeing.  You feel confused.  Your neck is stiff.  You have trouble breathing. Summary  Sinusitis is swelling of your sinuses. Sinuses are hollow spaces in the bones around your face.  This condition is caused by tissues in your nose that become inflamed or swollen. This traps germs. These can lead to infection.  If you were prescribed an antibiotic medicine, take it as told by your doctor. Do not stop taking it even if you start to feel better.  Keep all follow-up visits as told by your doctor. This is important. This information is not intended to replace advice given to you by your health care provider. Make sure you discuss any questions you have with your health care provider. Document Revised: 02/22/2018 Document Reviewed: 02/22/2018 Elsevier Patient Education  [employer].

## 2020-06-06 NOTE — Telephone Encounter (Signed)
Scott, Please see other notes. She was seen recently for migraines. No mention I can remember of a hearing issue. She is having nausea mainly due to drainage and head congestion so that may contribute to fluid on the ear. Wanted to give you heads up. Thanks, [name]
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
## 2020-06-27 ENCOUNTER — Encounter: Payer: Self-pay | Admitting: Nurse Practitioner

## 2020-06-29 ENCOUNTER — Telehealth (INDEPENDENT_AMBULATORY_CARE_PROVIDER_SITE_OTHER): Payer: Medicaid Other | Admitting: Nurse Practitioner

## 2020-06-29 ENCOUNTER — Telehealth: Payer: Self-pay | Admitting: *Deleted

## 2020-06-29 ENCOUNTER — Other Ambulatory Visit: Payer: Self-pay

## 2020-06-29 DIAGNOSIS — B9689 Other specified bacterial agents as the cause of diseases classified elsewhere: Secondary | ICD-10-CM

## 2020-06-29 DIAGNOSIS — J208 Acute bronchitis due to other specified organisms: Secondary | ICD-10-CM | POA: Diagnosis not present

## 2020-06-29 DIAGNOSIS — G43009 Migraine without aura, not intractable, without status migrainosus: Secondary | ICD-10-CM | POA: Diagnosis not present

## 2020-06-29 MED ORDER — AMOXICILLIN-POT CLAVULANATE 875-125 MG PO TABS: 1.0000 | ORAL_TABLET | Freq: Two times a day (BID) | ORAL | 0 refills | Status: DC

## 2020-06-29 NOTE — Progress Notes (Signed)
  PHONE VISIT Subjective:    Patient ID: Shannon Lin, female    DOB: [birth year], 29 y.o.   MRN: [id number]  HPI  Patient calls for a follow up on migraines. Patient also states she has had bad cough and congestion since last Friday and coughs till she gags.  Virtual Visit via Phone note:  I connected with Brocha L Velaquez on 06/29/20 at  9:20 AM EDT by a phone and verified that I am speaking with the correct person using two identifiers.  Location: Patient: home Provider: office    I discussed the limitations of evaluation and management by telemedicine and the availability of in person appointments. The patient expressed understanding and agreed to proceed.  History of Present Illness: States her migraines are a lot better. Takes her propranolol when she has a migraine, does not take daily. Is not taking her Maxalt. No change in migraine symptomatology. Main issue today is a cough that began last Friday. Has not had COVID vaccine. Her mother had COVID in August but she was isolated from her during that time. Has not been tested herself. No fever, chest pain, SOB or wheezing. No headache. PND. Producing Weisenberger mucus mainly in the mornings. No change in taste or smell. Taking fluids well. Voiding nl. No N/V/D. Some relief with OTC cough syrup.    Observations/Objective: Today's visit was via telephone Physical exam was not possible for this visit Alert, oriented.   Assessment and Plan: Problem List Items Addressed This Visit      Cardiovascular and Mediastinum   Migraine without aura and without status migrainosus, not intractable    Other Visit Diagnoses    Acute bacterial bronchitis    -  Primary     Meds ordered this encounter  Medications  . amoxicillin-clavulanate (AUGMENTIN) 875-125 MG tablet    Sig: Take 1 tablet by mouth 2 (two) times daily.    Dispense:  20 tablet    Refill:  0    Order Specific Question:   Supervising Provider    Answer:   Sallee Lange A [[id number]]      Follow Up Instructions: Discussed difference between preventive and abortive therapy for migraines. Recommend she take propranolol daily. Continue OTC cough syrup.  Warning signs reviewed. Call back in 4-5 days if no improvement, go to ED or urgent care sooner if worse. Recommend COVID testing today especially with physical scheduled next week.    I discussed the assessment and treatment plan with the patient. The patient was provided an opportunity to ask questions and all were answered. The patient agreed with the plan and demonstrated an understanding of the instructions.   The patient was advised to call back or seek an in-person evaluation if the symptoms worsen or if the condition fails to improve as anticipated.  I provided 15 minutes of non-face-to-face time during this encounter.     Review of Systems     Objective:   Physical Exam        Assessment & Plan:

## 2020-06-29 NOTE — Telephone Encounter (Signed)
Ms. jonah, nestle are scheduled for a virtual visit with your provider today.    Just as we do with appointments in the office, we must obtain your consent to participate.  Your consent will be active for this visit and any virtual visit you may have with one of our providers in the next 365 days.    If you have a MyChart account, I can also send a copy of this consent to you electronically.  All virtual visits are billed to your insurance company just like a traditional visit in the office.  As this is a virtual visit, video technology does not allow for your provider to perform a traditional examination.  This may limit your provider's ability to fully assess your condition.  If your provider identifies any concerns that need to be evaluated in person or the need to arrange testing such as labs, EKG, etc, we will make arrangements to do so.    Although advances in technology are sophisticated, we cannot ensure that it will always work on either your end or our end.  If the connection with a video visit is poor, we may have to switch to a telephone visit.  With either a video or telephone visit, we are not always able to ensure that we have a secure connection.   I need to obtain your verbal consent now.   Are you willing to proceed with your visit today?   Emmalou L Slagel has provided verbal consent on 06/29/2020 for a virtual visit (video or telephone).   Mitzie Na, RN 06/29/2020  8:57 AM

## 2020-06-30 ENCOUNTER — Encounter: Payer: Self-pay | Admitting: Nurse Practitioner

## 2020-07-02 ENCOUNTER — Emergency Department (HOSPITAL_COMMUNITY)
Admission: EM | Admit: 2020-07-02 | Discharge: 2020-07-02 | Disposition: A | Discharge status: Home / Self Care | Payer: Medicaid Other | Attending: Emergency Medicine | Admitting: Emergency Medicine

## 2020-07-02 ENCOUNTER — Other Ambulatory Visit: Payer: Self-pay

## 2020-07-02 ENCOUNTER — Encounter (HOSPITAL_COMMUNITY): Payer: Self-pay | Admitting: *Deleted

## 2020-07-02 ENCOUNTER — Telehealth: Payer: Self-pay

## 2020-07-02 ENCOUNTER — Telehealth (INDEPENDENT_AMBULATORY_CARE_PROVIDER_SITE_OTHER): Payer: Self-pay | Admitting: Internal Medicine

## 2020-07-02 DIAGNOSIS — F840 Autistic disorder: Secondary | ICD-10-CM | POA: Insufficient documentation

## 2020-07-02 DIAGNOSIS — Z79899 Other long term (current) drug therapy: Secondary | ICD-10-CM | POA: Diagnosis not present

## 2020-07-02 DIAGNOSIS — E119 Type 2 diabetes mellitus without complications: Secondary | ICD-10-CM | POA: Insufficient documentation

## 2020-07-02 DIAGNOSIS — Z7984 Long term (current) use of oral hypoglycemic drugs: Secondary | ICD-10-CM | POA: Diagnosis not present

## 2020-07-02 DIAGNOSIS — Z20822 Contact with and (suspected) exposure to covid-19: Secondary | ICD-10-CM | POA: Insufficient documentation

## 2020-07-02 DIAGNOSIS — K6289 Other specified diseases of anus and rectum: Secondary | ICD-10-CM | POA: Diagnosis present

## 2020-07-02 DIAGNOSIS — K644 Residual hemorrhoidal skin tags: Secondary | ICD-10-CM | POA: Diagnosis not present

## 2020-07-02 LAB — RESP PANEL BY RT PCR (RSV, FLU A&B, COVID)
Influenza A by PCR: NEGATIVE
Influenza B by PCR: NEGATIVE
Respiratory Syncytial Virus by PCR: NEGATIVE
SARS Coronavirus 2 by RT PCR: NEGATIVE

## 2020-07-02 MED ORDER — HYDROCORTISONE 2.5 % EX LOTN: TOPICAL_LOTION | Freq: Two times a day (BID) | CUTANEOUS | 0 refills | Status: AC

## 2020-07-02 NOTE — ED Triage Notes (Signed)
Pt has a protruding hemorrhoid due to a coughing spell yesterday; Dr. Laural Golden was called and instructed to come here so a surgeon can look at it

## 2020-07-02 NOTE — Telephone Encounter (Signed)
[facility] called to let us know that Dr, Laural Golden sent patient to ER to be looked at.

## 2020-07-02 NOTE — Telephone Encounter (Signed)
Noted  

## 2020-07-02 NOTE — ED Provider Notes (Signed)
This patient is a well-appearing 29 year old female with normal vital signs who presents with an acute hemorrhoid, the patient is well-appearing with a nonthrombosed soft but mildly tender hemorrhoid over the anus, chaperone present for exam. - pt informed of treatment plan - she was expecting an incision of the hemorrhoid or reduction into the anus -however this appears to be an external hemorrhoid, it is tender, it is not thrombosed thus reduction of the hemorrhoid or incision and drainage would be inappropriate.  The patient was informed of this, she is stable for discharge, will place her on stool softeners, laxatives and topical hydrocortisone.  Medical screening examination/treatment/procedure(s) were conducted as a shared visit with non-physician practitioner(s) and myself.  I personally evaluated the patient during the encounter.  Clinical Impression:   Final diagnoses:  External hemorrhoid         Noemi Chapel, MD 07/07/20 1850

## 2020-07-02 NOTE — Telephone Encounter (Signed)
Patient's mother Shannon Lin called for her daughter Shannon Lin. She told me that patient has bronchitis has been coughing very hard and developed a painful lump in perianal region last night. She said she was having severe pain giving a score of 7.8. She has tried sitz bath but to no relief.  Based on this history I feel patient has developed thrombosed hemorrhoid and since she is in a lot of pain she needs to go to ER for evaluation for I&D. Dr. Noemi Chapel was contacted with this information.

## 2020-07-02 NOTE — Discharge Instructions (Addendum)
You have an external hemorrhoid.  I prescribed you steroids Please Apply Twice a Day for the Next 7 Days.  I also want you to take MiraLAX daily for the next 3 weeks.  I have given you information for MiraLAX please read.  Your Covid test is pending I want you to self quarantine until you see your results on MyChart.  If you are Covid positive you must isolate for 10 days starting on symptom onset.  If Covid positive please contact "post Covid care" as they provide you with further instruction and management of your Covid symptoms.   I want you to follow-up with your GI specialist for further evaluation management of your hemorrhoid.  Come back to emergency department if you develop worsening rectal pain, rectal bleeding, fever, chills, chest pain, shortness of breath, abdominal pain, nausea, vomiting, diarrhea.
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
## 2020-07-06 ENCOUNTER — Encounter: Payer: Self-pay | Admitting: Nurse Practitioner

## 2020-07-06 ENCOUNTER — Ambulatory Visit (INDEPENDENT_AMBULATORY_CARE_PROVIDER_SITE_OTHER): Payer: Medicaid Other | Admitting: Nurse Practitioner

## 2020-07-06 ENCOUNTER — Other Ambulatory Visit: Payer: Self-pay

## 2020-07-06 VITALS — BP 124/88 | HR 83 | Temp 97.3°F | Ht 61.0 in | Wt 217.0 lb

## 2020-07-06 DIAGNOSIS — Z0000 Encounter for general adult medical examination without abnormal findings: Secondary | ICD-10-CM | POA: Diagnosis not present

## 2020-07-06 DIAGNOSIS — Z23 Encounter for immunization: Secondary | ICD-10-CM

## 2020-07-06 DIAGNOSIS — K644 Residual hemorrhoidal skin tags: Secondary | ICD-10-CM

## 2020-07-06 DIAGNOSIS — Z01419 Encounter for gynecological examination (general) (routine) without abnormal findings: Secondary | ICD-10-CM

## 2020-07-06 MED ORDER — HYDROCORTISONE ACE-PRAMOXINE 2.5-1 % EX CREA: TOPICAL_CREAM | CUTANEOUS | 0 refills | Status: DC

## 2020-07-06 NOTE — Progress Notes (Signed)
Subjective:    Patient ID: Shannon Lin, female    DOB: [birth year], 29 y.o.   MRN: [id number]  HPI The patient comes in today for a wellness visit.  A review of their health history was completed. A review of medications was also completed.  Any needed refills; none  Eating habits: doesn't eat healthy   Regular exercise: keeping up with niece and nephew  Specialist pt sees on regular basis: Dr.Rehman(GI)  Preventative health issues were discussed. She is interested in the HPV vaccine.  Additional concerns: pt would like provider to look at hemorrhoid. Pt states that it is hard on top. Pt went to ER on 07/02/20 for hemorrhoid.    Review of Systems  Constitutional: Negative for chills, diaphoresis, fatigue and fever.  HENT: Negative for congestion, ear pain, hearing loss, rhinorrhea, sinus pain, sneezing, sore throat and tinnitus.   Respiratory: Negative for cough, chest tightness, shortness of breath and wheezing.   Cardiovascular: Negative for chest pain, palpitations and leg swelling.  Gastrointestinal: Positive for rectal pain. Negative for abdominal distention, abdominal pain, blood in stool, constipation, diarrhea, nausea and vomiting.       Reports a hemorrhoid that she discovered last Friday. Denies blood in the stool, constipation, or diarrhea.   Genitourinary: Negative for difficulty urinating, dysuria, flank pain, frequency, genital sores, menstrual problem, pelvic pain, urgency, vaginal bleeding, vaginal discharge and vaginal pain.  Skin: Negative for rash and wound.  Neurological: Negative for dizziness, light-headedness, numbness and headaches.  Psychiatric/Behavioral: Negative for agitation, self-injury, sleep disturbance and suicidal ideas. The patient is not nervous/anxious.        Continues follow up with psychiatrist.    PHQ9 SCORE ONLY 06/04/2020 07/23/2018 05/19/2017  PHQ-9 Total Score 2 1 1    GAD 7 : Generalized Anxiety Score 06/04/2020  Nervous, Anxious,  on Edge 1  Control/stop worrying 0  Worry too much - different things 0  Trouble relaxing 1  Restless 0  Easily annoyed or irritable 0  Afraid - awful might happen 0  Total GAD 7 Score 2  Anxiety Difficulty Not difficult at all       Objective:   Physical Exam Constitutional:      Appearance: She is well-developed and well-groomed.  Neck:     Thyroid: No thyroid mass, thyromegaly or thyroid tenderness.  Cardiovascular:     Rate and Rhythm: Normal rate and regular rhythm.     Pulses: Normal pulses.     Heart sounds: Normal heart sounds. No murmur heard.   Pulmonary:     Effort: Pulmonary effort is normal.     Breath sounds: Normal breath sounds. No wheezing or rhonchi.  Chest:     Comments: Deferred breast exam at this time, she reports no abnormal findings on self exams.  Abdominal:     General: Abdomen is flat. There is no distension.     Palpations: Abdomen is soft. There is no mass.     Tenderness: There is no abdominal tenderness. There is no guarding.  Genitourinary:    Rectum: External hemorrhoid present.     Comments: GU exam deferred at this time, Denies any abnormalities.  Small external hemorrhoid, rubbery with no discoloration Lymphadenopathy:     Cervical: No cervical adenopathy.  Neurological:     Mental Status: She is alert.  Psychiatric:        Mood and Affect: Mood normal.        Behavior: Behavior normal.  Thought Content: Thought content normal.    Today's Vitals   07/06/20 1313  BP: 124/88  Pulse: 83  Temp: (!) 97.3 F (36.3 C)  SpO2: 90%  Weight: 98.4 kg  Height: 5\' 1"  (1.549 m)   Body mass index is 41 kg/m.  See labs 10/25/19.    Assessment & Plan:   Problem List Items Addressed This Visit    None    Visit Diagnoses    Well woman exam    -  Primary   Need for vaccination       Relevant Orders   Flu Vaccine QUAD 6+ mos PF IM (Fluarix Quad PF) (Completed)   External hemorrhoid         Meds ordered this encounter    Medications  . Pramoxine-HC (HYDROCORTISONE ACE-PRAMOXINE) 2.5-1 % CREA    Sig: Apply to rectal area/hemorrhoid TID prn    Dispense:  28.4 g    Refill:  0    Order Specific Question:   Supervising Provider    Answer:   Sallee Lange A [[id number]]   Warm sitz baths. Continue Tucks pads. Call back next week if no improvement in hemorrhoid.  HPV vaccine not available at our office for adults over 18.  Contact [facility] about the HPV vaccine (guardasil), if they cannot do it, check with the health department.  Defers COVID vaccine for now. Discussed her concerns.  Return in about 1 year (around 07/06/2021) for physical. Routine follow up for migraines.

## 2020-07-06 NOTE — Patient Instructions (Signed)
Contact [facility] about the HPV vaccine (guardasil), if they cannot do it, check with the health department.

## 2020-07-07 ENCOUNTER — Encounter: Payer: Self-pay | Admitting: Nurse Practitioner

## 2020-07-07 NOTE — Progress Notes (Signed)
   Subjective:    Patient ID: Shannon Lin, female    DOB: 1990-11-07, 29 y.o.   MRN: [id number]  HPI    Review of Systems     Objective:   Physical Exam        Assessment & Plan:

## 2020-07-25 ENCOUNTER — Encounter: Payer: Self-pay | Admitting: Nurse Practitioner

## 2020-07-27 ENCOUNTER — Ambulatory Visit: Payer: Medicaid Other | Admitting: Family Medicine

## 2020-07-30 ENCOUNTER — Encounter: Payer: Self-pay | Admitting: Family Medicine

## 2020-08-20 ENCOUNTER — Encounter: Payer: Self-pay | Admitting: Nurse Practitioner

## 2020-08-20 ENCOUNTER — Encounter: Payer: Self-pay | Admitting: Family Medicine

## 2020-08-22 ENCOUNTER — Other Ambulatory Visit: Payer: Self-pay

## 2020-08-22 ENCOUNTER — Encounter: Payer: Self-pay | Admitting: Family Medicine

## 2020-08-22 ENCOUNTER — Ambulatory Visit: Payer: Medicaid Other | Admitting: Family Medicine

## 2020-08-22 VITALS — BP 128/78 | HR 105 | Temp 98.2°F | Wt 217.0 lb

## 2020-08-22 DIAGNOSIS — G43009 Migraine without aura, not intractable, without status migrainosus: Secondary | ICD-10-CM | POA: Diagnosis not present

## 2020-08-22 DIAGNOSIS — L089 Local infection of the skin and subcutaneous tissue, unspecified: Secondary | ICD-10-CM | POA: Diagnosis not present

## 2020-08-22 MED ORDER — DOXYCYCLINE HYCLATE 100 MG PO TABS: 100.0000 mg | ORAL_TABLET | Freq: Two times a day (BID) | ORAL | 0 refills | Status: DC

## 2020-08-22 MED ORDER — RIZATRIPTAN BENZOATE 10 MG PO TBDP: 10.0000 mg | ORAL_TABLET | ORAL | 0 refills | Status: DC | PRN
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
## 2020-08-22 NOTE — Patient Instructions (Signed)

## 2020-08-24 ENCOUNTER — Encounter: Payer: Self-pay | Admitting: Family Medicine

## 2020-09-05 ENCOUNTER — Telehealth (INDEPENDENT_AMBULATORY_CARE_PROVIDER_SITE_OTHER): Payer: Self-pay | Admitting: *Deleted

## 2020-09-05 NOTE — Telephone Encounter (Signed)
Patient's mother called our office on behalf of her daughter.She states that she is having issues with very painful hemorrhoids.  She has ask about daughter seeing a Psychologist, sport and exercise. I addressed this with Dr.Castaneda. He gave permission for her to be sent to a surgeon for further evaluation of her hemorrhoids.  For patient's discomfort she may use preparation h , this is per Dr.Castaneda. Please call the patient's mother Shannon Lin [phone].  I did ask what surgeon she would prefer. The mother states that she is okay with Dr.Bridges in [place].

## 2020-09-06 ENCOUNTER — Ambulatory Visit: Payer: Medicaid Other | Admitting: Family Medicine

## 2020-09-06 NOTE — Telephone Encounter (Signed)
Referral placed to Dr Constance Haw

## 2020-09-06 NOTE — Telephone Encounter (Signed)
Forwarding to Ann

## 2020-09-13 ENCOUNTER — Encounter: Payer: Self-pay | Admitting: Nurse Practitioner

## 2020-09-14 NOTE — Telephone Encounter (Signed)
I recommend an office visit for evaluation. Thanks.

## 2020-09-14 NOTE — Telephone Encounter (Signed)
Do we have opening for a sick visit?

## 2020-09-18 ENCOUNTER — Other Ambulatory Visit: Payer: Self-pay

## 2020-09-18 ENCOUNTER — Encounter: Payer: Self-pay | Admitting: General Surgery

## 2020-09-18 ENCOUNTER — Ambulatory Visit (INDEPENDENT_AMBULATORY_CARE_PROVIDER_SITE_OTHER): Payer: Medicaid Other | Admitting: General Surgery

## 2020-09-18 VITALS — BP 113/72 | HR 76 | Temp 99.0°F | Resp 14 | Ht 61.0 in | Wt 216.0 lb

## 2020-09-18 DIAGNOSIS — K645 Perianal venous thrombosis: Secondary | ICD-10-CM | POA: Diagnosis not present
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
## 2020-09-18 NOTE — Patient Instructions (Signed)

## 2020-10-03 ENCOUNTER — Other Ambulatory Visit: Payer: Self-pay | Admitting: Nurse Practitioner

## 2020-10-03 ENCOUNTER — Encounter: Payer: Self-pay | Admitting: Nurse Practitioner

## 2020-10-03 MED ORDER — PROPRANOLOL HCL ER 80 MG PO CP24: 80.0000 mg | ORAL_CAPSULE | Freq: Every day | ORAL | 2 refills | Status: DC

## 2020-10-14 ENCOUNTER — Encounter: Payer: Self-pay | Admitting: Nurse Practitioner

## 2020-10-14 ENCOUNTER — Encounter: Payer: Self-pay | Admitting: Family Medicine

## 2020-10-16 ENCOUNTER — Ambulatory Visit (INDEPENDENT_AMBULATORY_CARE_PROVIDER_SITE_OTHER): Payer: Medicaid Other | Admitting: Family Medicine

## 2020-10-16 ENCOUNTER — Encounter: Payer: Self-pay | Admitting: Family Medicine

## 2020-10-16 ENCOUNTER — Other Ambulatory Visit: Payer: Self-pay

## 2020-10-16 VITALS — BP 128/84 | HR 102 | Temp 97.5°F | Ht 61.0 in | Wt 221.2 lb

## 2020-10-16 DIAGNOSIS — R631 Polydipsia: Secondary | ICD-10-CM

## 2020-10-16 DIAGNOSIS — H9201 Otalgia, right ear: Secondary | ICD-10-CM | POA: Diagnosis not present

## 2020-10-16 DIAGNOSIS — R110 Nausea: Secondary | ICD-10-CM | POA: Diagnosis not present

## 2020-10-16 LAB — POCT URINALYSIS DIPSTICK
Spec Grav, UA: 1.015 (ref 1.010–1.025)
pH, UA: 6 (ref 5.0–8.0)

## 2020-10-16 LAB — POCT GLUCOSE (DEVICE FOR HOME USE): POC Glucose: 100 mg/dl — AB (ref 70–99)

## 2020-10-16 LAB — POCT URINE PREGNANCY: Preg Test, Ur: NEGATIVE
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
## 2020-10-17 LAB — HCG, SERUM, QUALITATIVE: hCG,Beta Subunit,Qual,Serum: NEGATIVE m[IU]/mL (ref <6)

## 2020-10-30 ENCOUNTER — Ambulatory Visit: Payer: Medicaid Other | Admitting: General Surgery

## 2020-11-12 ENCOUNTER — Encounter: Payer: Self-pay | Admitting: Nurse Practitioner

## 2020-11-19 ENCOUNTER — Ambulatory Visit: Payer: Medicaid Other | Admitting: Adult Health

## 2020-11-22 ENCOUNTER — Ambulatory Visit (INDEPENDENT_AMBULATORY_CARE_PROVIDER_SITE_OTHER): Payer: Medicaid Other | Admitting: General Surgery

## 2020-11-22 ENCOUNTER — Ambulatory Visit: Payer: Medicaid Other | Admitting: Adult Health

## 2020-11-22 ENCOUNTER — Other Ambulatory Visit: Payer: Self-pay

## 2020-11-22 ENCOUNTER — Encounter: Payer: Self-pay | Admitting: Adult Health

## 2020-11-22 ENCOUNTER — Encounter: Payer: Self-pay | Admitting: General Surgery

## 2020-11-22 VITALS — BP 123/80 | HR 77 | Ht 61.0 in | Wt 224.0 lb

## 2020-11-22 VITALS — BP 111/78 | HR 86 | Temp 97.5°F | Resp 16 | Ht 61.0 in | Wt 222.0 lb

## 2020-11-22 DIAGNOSIS — N83202 Unspecified ovarian cyst, left side: Secondary | ICD-10-CM

## 2020-11-22 DIAGNOSIS — R1032 Left lower quadrant pain: Secondary | ICD-10-CM | POA: Insufficient documentation

## 2020-11-22 DIAGNOSIS — K645 Perianal venous thrombosis: Secondary | ICD-10-CM

## 2020-11-22 NOTE — Progress Notes (Signed)
  Subjective:     Patient ID: Shannon Lin, female   DOB: [birth year], 30 y.o.   MRN: [id number]  HPI Shannon Lin is a 30 year old white female, single, G0P0 in complaining if pain in left side, thinks cyst is better.  She had CA 125 of 7.5 06/04/20 but she is still worried.  PCP is Dr Sallee Lange.  Review of Systems Pain in left side for about a week  No pain with sex or problems with urination or BMs Reviewed past medical,surgical, social and family history. Reviewed medications and allergies.     Objective:   Physical Exam BP 123/80 (BP Location: Left Arm, Patient Position: Sitting, Cuff Size: Large)   Pulse 77   Ht 5\' 1"  (1.549 m)   Wt 224 lb (101.6 kg)   BMI 42.32 kg/m  Skin warm and dry.Pelvic: external genitalia is normal in appearance no lesions, vagina: scant discharge without odor,urethra has no lesions or masses noted, cervix:smooth, uterus: normal size, shape and contour, non tender, no masses felt, adnexa: no masses,LLQ tenderness noted. Bladder is non tender and no masses felt.  Discussed with Dr Elonda Husky and he will see her after [place].  Examination chaperoned by Levy Pupa LPN Fall risk is low  [facility] - 11/22/20 1212      Pregnancy Intention Screening   Does the patient want to become pregnant in the next year? Unsure    Does the patient's partner want to become pregnant in the next year? Unsure    Would the patient like to discuss contraceptive options today? No      Contraception Wrap Up   Current Method Hormonal Implant    End Method Hormonal Implant    Contraception Counseling Provided No             Assessment:     1. LLQ pain Will get GYN [place] in 2-3 weeks and then see Dr Elonda Husky after [place] to discuss   2. Left ovarian cyst Will get GYN [place] to assess cyst     Plan:     Return in 2-3 weeks for GYN [place] and see Dr Elonda Husky after to discuss options, including surgical removal
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
## 2020-11-22 NOTE — Patient Instructions (Signed)
Surgical Procedures for Hemorrhoids Surgical procedures can be used to treat hemorrhoids. Hemorrhoids are swollen veins that are inside the rectum (internal hemorrhoids) or around the anus (external hemorrhoids). They are caused by increased pressure in the anal area. This pressure may result from straining to have a bowel movement (constipation), diarrhea, pregnancy, obesity, or sitting for long periods of time. Hemorrhoids can cause symptoms such as pain and bleeding. Surgery may be needed if diet changes, lifestyle changes, and other treatments do not help your symptoms. Common surgical methods that may be used include:  Closed hemorrhoidectomy. The hemorrhoids are surgically removed, and the incisions are closed with stitches (sutures).  Open hemorrhoidectomy. The hemorrhoids are surgically removed, but the incisions are allowed to heal without sutures.  Stapled hemorrhoidectomy. The hemorrhoids are partially removed, and the incisions are closed with staples. Tell a health care provider about:  Any allergies you have.  All medicines you are taking, including vitamins, herbs, eye drops, creams, and over-the-counter medicines.  Any problems you or family members have had with anesthetic medicines.  Any blood disorders you have.  Any surgeries you have had.  Any medical conditions you have.  Whether you are pregnant or may be pregnant. What are the risks? Generally, this is a safe procedure. However, problems may occur, including:  Infection.  Bleeding.  Allergic reactions to medicines.  Damage to other structures or organs.  Pain.  Constipation.  Difficulty passing urine.  Narrowing of the anal canal (stenosis).  Difficulty controlling bowel movements (incontinence).  Recurring hemorrhoids.  A new passage (fistula) that forms between the anus or rectum and another area. What happens before the procedure? Medicines  Ask your health care provider about: ? Changing  or stopping your regular medicines. This is especially important if you are taking diabetes medicines or blood thinners. ? Taking medicines such as aspirin and ibuprofen. These medicines can thin your blood. Do not take these medicines unless your health care provider tells you to take them. ? Taking over-the-counter medicines, vitamins, herbs, and supplements. General instructions  You may need to have a procedure to examine the inside of your colon with a scope (colonoscopy). Your health care provider may do this to make sure that there are no other causes for your bleeding or pain.  You may be instructed to take a laxative and an enema to clean out your colon before surgery (bowel prep). Carefully follow instructions from your health care provider about bowel prep.  Plan to have someone take you home from the hospital or clinic.  Plan to have a responsible adult care for you for at least 24 hours after you leave the hospital or clinic. This is important.  Ask your health care provider: ? How your surgery site will be marked. ? What steps will be taken to help prevent infection. These may include:  Washing skin with a germ-killing soap.  Taking antibiotic medicine. What happens during the procedure?  An IV will be inserted into one of your veins.  You will be given one or more of the following: ? A medicine to help you relax (sedative). ? A medicine to numb the area (local anesthetic). ? A medicine to make you fall asleep (general anesthetic). ? A medicine that is injected into an area of your body to numb everything below the injection site (regional anesthetic).  A lubricating jelly may be placed into your rectum.  Your surgeon will insert a short scope (anoscope) into your rectum to examine the hemorrhoids.    One of the following surgical methods will be used to remove the hemorrhoids: ? Closed hemorrhoidectomy.  Your surgeon will use surgical instruments to open the tissue  around the hemorrhoids.  The veins that supply the hemorrhoids will be tied off with a suture.  The hemorrhoids will be removed.  The tissue that surrounds the hemorrhoids will be closed with sutures that your body can absorb (absorbable sutures). ? Open hemorrhoidectomy.  The hemorrhoids will be removed with surgical instruments.  The incisions will be left open to heal without sutures. ? Stapled hemorrhoidectomy.  Your surgeon will use a circular stapling device to partially remove the hemorrhoids.  The device will be inserted into your anus. It will remove a circular ring of tissue that includes hemorrhoid tissue and some tissue above the hemorrhoids.  The staples in the device will close the edges of the tissue. This will cut off the blood supply to any remaining hemorrhoids and pull the tissue back into place. Each of these procedures may vary among health care providers and hospitals. What happens after the procedure?  Your blood pressure, heart rate, breathing rate, and blood oxygen level may be monitored until you leave the hospital or clinic.  You will be given pain medicine as needed.  Do not drive for 24 hours if you were given a sedative during your procedure. Summary  Surgery may be needed for hemorrhoids if diet changes, lifestyle changes, and other treatments do not help your symptoms.  There are three common methods of surgery that are used to treat hemorrhoids.  Follow instructions from your health care provider about taking medicines and about eating and drinking before the procedure.  You may be instructed to take a laxative and an enema to clean out your colon before surgery (bowel prep). This information is not intended to replace advice given to you by your health care provider. Make sure you discuss any questions you have with your health care provider. Document Revised: 03/09/2019 Document Reviewed: 08/10/2018 Elsevier Patient Education  2021 [employer]  
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
## 2020-12-05 ENCOUNTER — Other Ambulatory Visit (HOSPITAL_COMMUNITY): Payer: Medicaid Other

## 2020-12-05 ENCOUNTER — Inpatient Hospital Stay (HOSPITAL_COMMUNITY): Admission: RE | Admit: 2020-12-05 | Payer: Medicaid Other | Source: Ambulatory Visit

## 2020-12-07 ENCOUNTER — Ambulatory Visit: Admit: 2020-12-07 | Payer: Medicaid Other | Admitting: General Surgery

## 2020-12-07 DIAGNOSIS — K645 Perianal venous thrombosis: Secondary | ICD-10-CM | POA: Diagnosis present

## 2020-12-07 SURGERY — HEMORRHOIDECTOMY
Anesthesia: General

## 2020-12-11 ENCOUNTER — Other Ambulatory Visit: Payer: Medicaid Other

## 2020-12-11 ENCOUNTER — Ambulatory Visit (INDEPENDENT_AMBULATORY_CARE_PROVIDER_SITE_OTHER): Payer: Medicaid Other

## 2020-12-11 ENCOUNTER — Other Ambulatory Visit: Payer: Self-pay

## 2020-12-11 ENCOUNTER — Other Ambulatory Visit: Payer: Self-pay | Admitting: Adult Health

## 2020-12-11 ENCOUNTER — Ambulatory Visit: Payer: Medicaid Other | Admitting: Obstetrics & Gynecology

## 2020-12-11 DIAGNOSIS — N83201 Unspecified ovarian cyst, right side: Secondary | ICD-10-CM

## 2020-12-11 DIAGNOSIS — R1032 Left lower quadrant pain: Secondary | ICD-10-CM | POA: Diagnosis not present

## 2020-12-11 DIAGNOSIS — N83202 Unspecified ovarian cyst, left side: Secondary | ICD-10-CM

## 2020-12-11 NOTE — Progress Notes (Signed)
PELVIC US TA/TV: normal homogeneous anteflexed uterus,EEC 2.4 mm,normal left ovary,simple right ovarian cyst with arterial and venous flow 5.1 x 4.7 x 5.1 cm,no free fluid,left adnexal discomfort during ultrasound  Chaperone Peggy

## 2020-12-14 ENCOUNTER — Telehealth: Payer: Self-pay | Admitting: Obstetrics & Gynecology

## 2020-12-14 NOTE — Telephone Encounter (Signed)
LMOVM of mother returning her call explaining they needed come to a mutual agreement.  Spoke to patient who states she wants to proceed with the surgery however her mom does not. Spoke with Dr Elonda Husky who stated he discussed all of this at length with them at her appointment but left the decision in their hands to proceed with surgery or leave it.  Pt verbalized understanding and will talk with her mom again.

## 2020-12-14 NOTE — Telephone Encounter (Signed)
She has an unchanged right ovarian cyst(comparison 8/21) and she has the option to do nothing or have it removed laparoscopically, not sure why there is confusionj, there wasn't any the other day

## 2020-12-14 NOTE — Telephone Encounter (Signed)
Patient's mom called stating that she would like a call back from Dr. Brynda Greathouse nurse regarding her daughter's visit. Patient's mom states that daughter doesn't understand what Dr. Elonda Husky told her. Please contact pt's mom

## 2020-12-20 ENCOUNTER — Encounter: Payer: Self-pay | Admitting: Family Medicine

## 2020-12-24 ENCOUNTER — Ambulatory Visit: Payer: Medicaid Other | Admitting: Obstetrics & Gynecology

## 2020-12-24 ENCOUNTER — Other Ambulatory Visit: Payer: Medicaid Other

## 2020-12-27 ENCOUNTER — Encounter: Payer: Self-pay | Admitting: General Surgery

## 2020-12-27 ENCOUNTER — Ambulatory Visit (INDEPENDENT_AMBULATORY_CARE_PROVIDER_SITE_OTHER): Payer: Medicaid Other | Admitting: General Surgery

## 2020-12-27 ENCOUNTER — Other Ambulatory Visit: Payer: Self-pay

## 2020-12-27 VITALS — BP 114/81 | HR 78 | Temp 97.4°F | Resp 18 | Ht 61.0 in | Wt 225.0 lb

## 2020-12-27 DIAGNOSIS — K641 Second degree hemorrhoids: Secondary | ICD-10-CM | POA: Diagnosis not present

## 2020-12-28 DIAGNOSIS — K641 Second degree hemorrhoids: Secondary | ICD-10-CM | POA: Insufficient documentation

## 2020-12-28 NOTE — Progress Notes (Signed)
[facility] Note   HPI:  30 y.o. Female presents to clinic for follow-up evaluation of her hemorrhoids. She had said she wanted surgery and then said she did not. She is here to discuss any options of a lower GI to control her hemorrhoids. Discussed that the only Lower GI that I was aware of was a radiology imaging that does not treat hemorrhoids. Given her external component of her hemorrhoids also discussed that banding would not really change her symptoms of pain and discomfort and only manage bleeding and would not be recommended for someone with hemorrhoids that prolapse.   Review of Systems:  Pain with defecation anteriorly  All other review of systems: otherwise negative   Vital Signs:  BP 114/81   Pulse 78   Temp (!) 97.4 F (36.3 C) (Other (Comment))   Resp 18   Ht 5\' 1"  (1.549 m)   Wt 225 lb (102.1 kg)   SpO2 96%   BMI 42.51 kg/m    Physical Exam:  Physical Exam Vitals reviewed.  Cardiovascular:     Rate and Rhythm: Normal rate.  Pulmonary:     Effort: Pulmonary effort is normal.  Genitourinary:    Comments: Hemorrhoid tissue spontaneously reduced, some minor tag anteriorly but overall much improved, healed fissure posteriorly     Assessment:  30 y.o. yo Female with at least grade II hemorrhoids given that they have external component at times and have been thrombosed. She is overall better. She does not want to proceed with surgery and I assured her that there is no imaging study that will take care of the hemorrhoids.  Plan:  PRN follow up  Continue using lidocaine/ hemorrhoid creams as needed Keep stools soft    Curlene Labrum, MD [facility] [address] [place], [place] [address] [phone] (office)

## 2021-01-04 ENCOUNTER — Other Ambulatory Visit: Payer: Self-pay | Admitting: Family Medicine

## 2021-01-04 DIAGNOSIS — L089 Local infection of the skin and subcutaneous tissue, unspecified: Secondary | ICD-10-CM

## 2021-01-08 ENCOUNTER — Encounter: Payer: Self-pay | Admitting: Nurse Practitioner

## 2021-01-11 ENCOUNTER — Other Ambulatory Visit: Payer: Self-pay

## 2021-01-11 ENCOUNTER — Encounter: Payer: Self-pay | Admitting: Nurse Practitioner

## 2021-01-11 ENCOUNTER — Ambulatory Visit: Payer: Medicaid Other | Admitting: Nurse Practitioner

## 2021-01-11 VITALS — BP 116/68 | HR 98 | Temp 98.0°F | Wt 227.2 lb

## 2021-01-11 DIAGNOSIS — Z789 Other specified health status: Secondary | ICD-10-CM

## 2021-01-11 DIAGNOSIS — R002 Palpitations: Secondary | ICD-10-CM

## 2021-01-11 DIAGNOSIS — R5381 Other malaise: Secondary | ICD-10-CM

## 2021-01-11 MED ORDER — PROPRANOLOL HCL ER 80 MG PO CP24: 80.0000 mg | ORAL_CAPSULE | Freq: Every day | ORAL | 2 refills | Status: DC

## 2021-01-11 NOTE — Progress Notes (Signed)
   Subjective:    Patient ID: Shannon Lin, female    DOB: 29-Jan-1991, 30 y.o.   MRN: [id number]  HPI Pt here due to breathing "sounds like a squeaky toy". Happens mostly at night but can happen any time of day.  Denies any wheezing.  Pt has been watching her heart rate on her phone and it has been fluctuating.  Presents today with her boyfriend for complaints of elevated heart rate that began a few weeks ago at the end of March.  No history of Covid infection.  Mainly notices the increased heart rate with prolonged walking.  Symptoms last about 5 minutes.  Has not had any changes in her medications.  States her stress has improved.  Denies any OTC medications or herbals.  No chest discomfort.  No unusual shortness of breath.  Patient has very limited activity.  Drinks 2 cups of coffee, 3 diet Dr. Samson Frederic and 1 glass of tea per day.  The remainder of her beverages are water.  Has a mobile phone with her today with a heart rate 71-88 2 weeks ago and beginning on 4/6 103-129.  She thinks the elevated heart rates are during activity.  Review of Systems No chest pain, unusual shortness of breath or edema.    Objective:   Physical Exam NAD.  Alert, oriented.  Cheerful affect.  Lungs clear.  No tachypnea.  Heart regular rate and rhythm.  No murmur or gallop noted.  Lower extremities no edema. Today's Vitals   01/11/21 1119  BP: 116/68  Pulse: 98  Temp: 98 F (36.7 C)  SpO2: 98%  Weight: 227 lb 3.2 oz (103.1 kg)   Body mass index is 42.93 kg/m.        Assessment & Plan:  Palpitations  Caffeine use  Physical deconditioning  Recommend patient slowly decrease her caffeine intake over time.  States she enjoys her coffee, recommend she cut out the tea in the afternoon and begin to cut back on her diet sodas. There is also a question of significant physical deconditioning due to limited activity.  Recommend this patient slowly increase her activity to improve her physical  condition. Continue to monitor her pulse, patient shown how to check her radial pulse with return demonstration.  Advised patient that her mobile phone may not be quite accurate in assessing her heart rate.  Contact office if pulse consistently remains above 100 particularly if noted at rest. Warning signs reviewed regarding palpitations.  If symptoms persist or if new symptoms develop, patient to contact the office. Return if symptoms worsen or fail to improve.

## 2021-01-11 NOTE — Patient Instructions (Signed)
Slowly increase activity Slowly cut back on caffeine

## 2021-01-12 ENCOUNTER — Encounter: Payer: Self-pay | Admitting: Nurse Practitioner

## 2021-01-13 ENCOUNTER — Encounter: Payer: Self-pay | Admitting: Nurse Practitioner

## 2021-01-21 ENCOUNTER — Emergency Department (HOSPITAL_COMMUNITY)
Admission: EM | Admit: 2021-01-21 | Discharge: 2021-01-21 | Disposition: A | Discharge status: Home / Self Care | Payer: Medicaid Other | Attending: Emergency Medicine | Admitting: Emergency Medicine

## 2021-01-21 ENCOUNTER — Encounter (HOSPITAL_COMMUNITY): Payer: Self-pay | Admitting: *Deleted

## 2021-01-21 ENCOUNTER — Emergency Department (HOSPITAL_COMMUNITY): Payer: Medicaid Other

## 2021-01-21 DIAGNOSIS — Z79899 Other long term (current) drug therapy: Secondary | ICD-10-CM | POA: Diagnosis not present

## 2021-01-21 DIAGNOSIS — K219 Gastro-esophageal reflux disease without esophagitis: Secondary | ICD-10-CM

## 2021-01-21 DIAGNOSIS — R079 Chest pain, unspecified: Secondary | ICD-10-CM

## 2021-01-21 DIAGNOSIS — J45909 Unspecified asthma, uncomplicated: Secondary | ICD-10-CM | POA: Insufficient documentation

## 2021-01-21 DIAGNOSIS — R072 Precordial pain: Secondary | ICD-10-CM | POA: Diagnosis not present

## 2021-01-21 LAB — CBC
HCT: 39.8 % (ref 36.0–46.0)
Hemoglobin: 13.4 g/dL (ref 12.0–15.0)
MCH: 30.1 pg (ref 26.0–34.0)
MCHC: 33.7 g/dL (ref 30.0–36.0)
MCV: 89.4 fL (ref 80.0–100.0)
Platelets: 336 10*3/uL (ref 150–400)
RBC: 4.45 MIL/uL (ref 3.87–5.11)
RDW: 13 % (ref 11.5–15.5)
WBC: 6.9 10*3/uL (ref 4.0–10.5)
nRBC: 0 % (ref 0.0–0.2)

## 2021-01-21 LAB — BASIC METABOLIC PANEL
Anion gap: 10 (ref 5–15)
BUN: 10 mg/dL (ref 6–20)
CO2: 27 mmol/L (ref 22–32)
Calcium: 9.6 mg/dL (ref 8.9–10.3)
Chloride: 99 mmol/L (ref 98–111)
Creatinine, Ser: 0.54 mg/dL (ref 0.44–1.00)
GFR, Estimated: >60 mL/min (ref >60)
Glucose, Bld: 131 mg/dL — ABNORMAL HIGH (ref 70–99)
Potassium: 4.1 mmol/L (ref 3.5–5.1)
Sodium: 136 mmol/L (ref 135–145)

## 2021-01-21 LAB — D-DIMER, QUANTITATIVE: D-Dimer, Quant: 0.28 ug/mL-FEU (ref 0.00–0.50)

## 2021-01-21 LAB — TROPONIN I (HIGH SENSITIVITY)
Troponin I (High Sensitivity): <2 ng/L (ref <18)
Troponin I (High Sensitivity): <2 ng/L (ref <18)

## 2021-01-21 MED ORDER — ALUM & MAG HYDROXIDE-SIMETH 200-200-20 MG/5ML PO SUSP
30.0000 mL | Freq: Once | ORAL | Status: AC
  Administered 2021-01-21: 30 mL via ORAL
  Filled 2021-01-21: qty 30

## 2021-01-21 MED ORDER — LIDOCAINE VISCOUS HCL 2 % MT SOLN
15.0000 mL | Freq: Once | OROMUCOSAL | Status: AC
  Administered 2021-01-21: 15 mL via ORAL
  Filled 2021-01-21: qty 15
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
## 2021-01-21 NOTE — ED Triage Notes (Signed)
Chest pain onset 0730 today

## 2021-01-21 NOTE — Discharge Instructions (Addendum)
You were seen in the emergency department for chest pain.  You had a chest x-ray blood work and EKG that did not show any serious findings.  Your symptoms improved with a GI cocktail.  This may indicate that acid reflux is the cause of your symptoms.  Please take your acid medication daily.  Maalox between meals and at bedtime.  Follow-up with your doctor.  Return to the emergency department for any worsening or concerning symptoms

## 2021-01-22 ENCOUNTER — Telehealth: Payer: Self-pay

## 2021-01-22 NOTE — Telephone Encounter (Signed)
Transition Care Management Follow-up Telephone Call  Date of discharge and from where: 01/21/2021 from [facility]  How have you been since you were released from the hospital? Pt states that she is feeling better today and has no questions or concerns at this time.   Any questions or concerns? No  Items Reviewed:  Did the pt receive and understand the discharge instructions provided? Yes   Medications obtained and verified? Yes   Other? No   Any new allergies since your discharge? No   Dietary orders reviewed? n/a  Do you have support at home? Yes   Functional Questionnaire: (I = Independent and D = Dependent) ADLs: I  Bathing/Dressing- I  Meal Prep- I  Eating- I  Maintaining continence- I  Transferring/Ambulation- I  Managing Meds- I  Follow up appointments reviewed:   PCP Hospital f/u appt confirmed? Yes  Scheduled to see Sallee Lange, MD on 02/04/2021 @ 3:15pm.  [facility] f/u appt confirmed? No    Are transportation arrangements needed? No   If their condition worsens, is the pt aware to call PCP or go to the Emergency Dept.? Yes  Was the patient provided with contact information for the PCP's office or ED? Yes  Was to pt encouraged to call back with questions or concerns? Yes

## 2021-01-30 ENCOUNTER — Encounter: Payer: Self-pay | Admitting: Nurse Practitioner

## 2021-02-01 ENCOUNTER — Other Ambulatory Visit: Payer: Self-pay | Admitting: Nurse Practitioner

## 2021-02-02 ENCOUNTER — Other Ambulatory Visit: Payer: Self-pay | Admitting: Nurse Practitioner

## 2021-02-02 ENCOUNTER — Encounter: Payer: Self-pay | Admitting: Family Medicine

## 2021-02-02 DIAGNOSIS — N912 Amenorrhea, unspecified: Secondary | ICD-10-CM

## 2021-02-04 ENCOUNTER — Other Ambulatory Visit: Payer: Self-pay

## 2021-02-04 ENCOUNTER — Encounter: Payer: Self-pay | Admitting: Family Medicine

## 2021-02-04 ENCOUNTER — Ambulatory Visit: Payer: Medicaid Other | Admitting: Family Medicine

## 2021-02-04 VITALS — BP 118/71 | HR 99 | Temp 98.6°F | Ht 61.0 in | Wt 232.0 lb

## 2021-02-04 DIAGNOSIS — F319 Bipolar disorder, unspecified: Secondary | ICD-10-CM

## 2021-02-04 DIAGNOSIS — K219 Gastro-esophageal reflux disease without esophagitis: Secondary | ICD-10-CM

## 2021-02-04 MED ORDER — DIVALPROEX SODIUM ER 500 MG PO TB24: ORAL_TABLET | ORAL | 3 refills | Status: DC

## 2021-02-04 MED ORDER — GUANFACINE HCL ER 3 MG PO TB24: ORAL_TABLET | ORAL | 3 refills | Status: DC

## 2021-02-04 MED ORDER — PANTOPRAZOLE SODIUM 40 MG PO TBEC: 40.0000 mg | DELAYED_RELEASE_TABLET | Freq: Every day | ORAL | 3 refills | Status: DC

## 2021-02-04 MED ORDER — METFORMIN HCL 500 MG PO TABS: ORAL_TABLET | ORAL | 3 refills | Status: DC

## 2021-02-04 MED ORDER — ARIPIPRAZOLE 5 MG PO TABS: 5.0000 mg | ORAL_TABLET | Freq: Every day | ORAL | 3 refills | Status: DC

## 2021-02-04 MED ORDER — LAMOTRIGINE 200 MG PO TABS: ORAL_TABLET | ORAL | 3 refills | Status: DC

## 2021-02-04 NOTE — Progress Notes (Signed)
   Subjective:    Patient ID: Shannon Lin, female    DOB: 05-18-1991, 30 y.o.   MRN: [id number]  HPIpt concerned because she was told she had arthritis in both shoulders when she went to ED for chest pain.   After eating 2 hot dogs and felt dizzy and had nausea about one hour later. Checked blood sugar and it was 62.   Patient recently in the hospital ER for chest discomfort having a lot of reflux related symptoms Prilosec not helped  Patient also has morbid obesity unfortunately it is very difficult for her to lose weight because of her medications  She also has bipolar and the caretaker for this as "their practice in addition to this patient does not have access to a psychiatrist currently family unable to take her to [facility]  Mother does a lot of her care for her in regards to taking her to doctor appointments getting her medicines etc. Review of Systems See above    Objective:   Physical Exam Lungs clear heart regular pulse normal abdomen morbid obesity extremities no edema       Assessment & Plan:  1. Gastroesophageal reflux disease, unspecified whether esophagitis present Protonix on a regular basis stop omeprazole healthy diet recommended  2. Morbid obesity ([place]) Portion control stay away from sugary drinks regular physical activity reducing Abilify to 5 mg  3. Bipolar 1 disorder [facility]) Did give refills it will be difficult to find her a psychiatrist locally.  We will follow-up again in 1 to 2 months we will try to find a psychiatrist local unfortunately this is a ever growing problem in [place] Apparently went [facility] will not take her in

## 2021-02-05 NOTE — Progress Notes (Signed)
02/05/21-referral placed in Epic

## 2021-02-05 NOTE — Addendum Note (Signed)
Addended by: Vicente Males on: 02/05/2021 08:15 AM   Modules accepted: Orders

## 2021-02-07 ENCOUNTER — Telehealth: Payer: Self-pay | Admitting: Family Medicine

## 2021-02-12 ENCOUNTER — Encounter: Payer: Self-pay | Admitting: Nurse Practitioner

## 2021-03-07 ENCOUNTER — Encounter: Payer: Self-pay | Admitting: Nurse Practitioner

## 2021-03-14 ENCOUNTER — Encounter: Payer: Self-pay | Admitting: Nurse Practitioner

## 2021-03-18 ENCOUNTER — Telehealth: Payer: Self-pay | Admitting: Family Medicine

## 2021-03-18 ENCOUNTER — Telehealth: Payer: Medicaid Other | Admitting: Nurse Practitioner

## 2021-03-18 ENCOUNTER — Telehealth: Payer: Medicaid Other | Admitting: Family Medicine

## 2021-03-18 DIAGNOSIS — R059 Cough, unspecified: Secondary | ICD-10-CM | POA: Diagnosis not present

## 2021-03-18 MED ORDER — PREDNISONE 10 MG (21) PO TBPK: ORAL_TABLET | ORAL | 0 refills | Status: DC

## 2021-03-18 MED ORDER — BENZONATATE 100 MG PO CAPS: 100.0000 mg | ORAL_CAPSULE | Freq: Three times a day (TID) | ORAL | 0 refills | Status: DC | PRN

## 2021-03-18 NOTE — Telephone Encounter (Signed)
I would not recommend any type of prescription cough medicine.(We will not be doing Hycodan) It would be wise for the patient to be seen may have office visit for later this week

## 2021-03-18 NOTE — Telephone Encounter (Signed)
Patient notified and verbalized understanding. 

## 2021-03-18 NOTE — Telephone Encounter (Signed)
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 Please read bottom front sheets [employer] is denying FMLA. Will patient need letter also explaining reason because we did see her in office for some of these days she was seen at ER at [facility] please advise.

## 2021-03-18 NOTE — Progress Notes (Signed)
We are sorry that you are not feeling well.  Here is how we plan to help!  Based on your presentation I believe you most likely have A cough due to a virus.  This is called viral bronchitis and is best treated by rest, plenty of fluids and control of the cough.  You may use Ibuprofen or Tylenol as directed to help your symptoms.     In addition you may use A prescription cough medication called Tessalon Perles 100mg. You may take 1-2 capsules every 8 hours as needed for your cough.  Prednisone 10 mg daily for 6 days (see taper instructions below)  Directions for 6 day taper: Day 1: 2 tablets before breakfast, 1 after both lunch & dinner and 2 at bedtime Day 2: 1 tab before breakfast, 1 after both lunch & dinner and 2 at bedtime Day 3: 1 tab at each meal & 1 at bedtime Day 4: 1 tab at breakfast, 1 at lunch, 1 at bedtime Day 5: 1 tab at breakfast & 1 tab at bedtime Day 6: 1 tab at breakfast   From your responses in the eVisit questionnaire you describe inflammation in the upper respiratory tract which is causing a significant cough.  This is commonly called Bronchitis and has four common causes:    Allergies  Viral Infections  Acid Reflux  Bacterial Infection Allergies, viruses and acid reflux are treated by controlling symptoms or eliminating the cause. An example might be a cough caused by taking certain blood pressure medications. You stop the cough by changing the medication. Another example might be a cough caused by acid reflux. Controlling the reflux helps control the cough.  USE OF BRONCHODILATOR ("RESCUE") INHALERS: There is a risk from using your bronchodilator too frequently.  The risk is that over-reliance on a medication which only relaxes the muscles surrounding the breathing tubes can reduce the effectiveness of medications prescribed to reduce swelling and congestion of the tubes themselves.  Although you feel brief relief from the bronchodilator inhaler, your asthma may  actually be worsening with the tubes becoming more swollen and filled with mucus.  This can delay other crucial treatments, such as oral steroid medications. If you need to use a bronchodilator inhaler daily, several times per day, you should discuss this with your provider.  There are probably better treatments that could be used to keep your asthma under control.     HOME CARE . Only take medications as instructed by your medical team. . Complete the entire course of an antibiotic. . Drink plenty of fluids and get plenty of rest. . Avoid close contacts especially the very young and the elderly . Cover your mouth if you cough or cough into your sleeve. . Always remember to wash your hands . A steam or ultrasonic humidifier can help congestion.   GET HELP RIGHT AWAY IF: . You develop worsening fever. . You become short of breath . You cough up blood. . Your symptoms persist after you have completed your treatment plan MAKE SURE YOU   Understand these instructions.  Will watch your condition.  Will get help right away if you are not doing well or get worse.  Your e-visit answers were reviewed by a board certified advanced clinical practitioner to complete your personal care plan.  Depending on the condition, your plan could have included both over the counter or prescription medications. If there is a problem please reply  once you have received a response from your provider. Your   safety is important to us.  If you have drug allergies check your prescription carefully.    You can use MyChart to ask questions about today's visit, request a non-urgent call back, or ask for a work or school excuse for 24 hours related to this e-Visit. If it has been greater than 24 hours you will need to follow up with your provider, or enter a new e-Visit to address those concerns. You will get an e-mail in the next two days asking about your experience.  I hope that your e-visit has been valuable and will  speed your recovery. Thank you for using e-visits.  5-10 minutes spent reviewing and documenting in chart.  

## 2021-03-18 NOTE — Telephone Encounter (Signed)
Patient is requesting something for bad cough. She has sent three my chart message for bad cough. [facility] [address]

## 2021-03-31 ENCOUNTER — Encounter: Payer: Self-pay | Admitting: Family Medicine

## 2021-04-03 ENCOUNTER — Ambulatory Visit (INDEPENDENT_AMBULATORY_CARE_PROVIDER_SITE_OTHER): Payer: Medicaid Other | Admitting: Gastroenterology

## 2021-04-03 ENCOUNTER — Other Ambulatory Visit: Payer: Self-pay

## 2021-04-03 ENCOUNTER — Encounter (INDEPENDENT_AMBULATORY_CARE_PROVIDER_SITE_OTHER): Payer: Self-pay | Admitting: Gastroenterology

## 2021-04-03 VITALS — BP 109/73 | HR 98 | Temp 99.0°F | Ht 61.0 in | Wt 234.0 lb

## 2021-04-03 DIAGNOSIS — K625 Hemorrhage of anus and rectum: Secondary | ICD-10-CM | POA: Diagnosis not present

## 2021-04-03 NOTE — Telephone Encounter (Signed)
Erica I filled this out as best as I could.  Please review it to see if there is anything else you think it needs if so please let me do it before we send it off If it meets your approval please communicate with Jenny Reichmann and hopefully her insurance company will find a satisfactory Thanks-Dr. Nicki Reaper
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
## 2021-04-03 NOTE — Patient Instructions (Signed)
Keep using Preparation H wipes as needed If rectal bleeding recurs or gets worse, please call to schedule colonoscopy

## 2021-04-19 ENCOUNTER — Encounter: Payer: Self-pay | Admitting: Nurse Practitioner

## 2021-04-26 ENCOUNTER — Encounter: Payer: Self-pay | Admitting: Family Medicine

## 2021-05-02 ENCOUNTER — Ambulatory Visit: Payer: Medicaid Other | Admitting: Nurse Practitioner

## 2021-05-14 ENCOUNTER — Encounter: Payer: Self-pay | Admitting: Family Medicine

## 2021-06-23 ENCOUNTER — Encounter: Payer: Self-pay | Admitting: Nurse Practitioner

## 2021-06-28 ENCOUNTER — Ambulatory Visit (INDEPENDENT_AMBULATORY_CARE_PROVIDER_SITE_OTHER): Payer: Medicaid Other | Admitting: Nurse Practitioner

## 2021-06-28 ENCOUNTER — Other Ambulatory Visit: Payer: Self-pay

## 2021-06-28 ENCOUNTER — Encounter: Payer: Self-pay | Admitting: Nurse Practitioner

## 2021-06-28 VITALS — BP 112/72 | Temp 98.2°F | Ht 61.0 in | Wt 229.0 lb

## 2021-06-28 DIAGNOSIS — Z23 Encounter for immunization: Secondary | ICD-10-CM | POA: Diagnosis not present

## 2021-06-28 DIAGNOSIS — R55 Syncope and collapse: Secondary | ICD-10-CM | POA: Diagnosis not present

## 2021-06-28 NOTE — Progress Notes (Signed)
   Subjective:    Patient ID: Shannon Lin, female    DOB: [birth year], 30 y.o.   MRN: [id number]  HPI Dizzy spells and fainting in August x 2 , still not feeling well Presents for complaints of 2 syncopal episodes that occurred back in August.  Her boyfriend is present for most of the exam.  Describes 2 fainting spells both occurred in the afternoon between 2:58 PM, neither episode was witnessed.  Patient is unsure what brought on the fainting spell.  Both episodes began with a headache and some dizziness.  For one of the episodes she checked her blood sugar which was 98.  Describes a pounding headache on the top of the head no visual changes no numbness or weakness of the face arms or legs.  No difficulty speaking.  No nausea vomiting.  Did have some blurred vision after the episode, no incontinence or defecation.  Has remote history of seizures many years ago when she was elementary school.  None since.  It is unknown how long the episodes last, states she was very sweaty afterwards, took a shower and her headache resolved after rest.  No changes in her medications. No chest pain/ischemic type pain or unusual shortness of breath.  No palpitations. Is due for her regular eye exam, wears glasses.        Objective:   Physical Exam NAD.  Alert, oriented.  Pupils equal and reactive to light.  EOMs intact without nystagmus.  Lungs clear.  Heart regular rate rhythm.  No murmur or gallop noted.  Thyroid nontender to palpation, no mass or goiter noted.  EKG normal sinus rhythm.  Hand strength 5+ bilaterally.  Reflexes normal limit.  Romberg negative.  Gait normal and steady.  Speech clear. Today's Vitals   06/28/21 1053  Temp: 98.2 F (36.8 C)  SpO2: 97%  Weight: 229 lb (103.9 kg)  Height: 5\' 1"  (1.549 m)   Body mass index is 43.27 kg/m.       Assessment & Plan:   Problem List Items Addressed This Visit       Other   Syncope - Primary   Relevant Orders   EKG 12-Lead (Completed)    CBC with Differential/Platelet   Comprehensive metabolic panel   TSH   Ambulatory referral to Neurology   Other Visit Diagnoses     Flu vaccine need       Relevant Orders   Flu Vaccine QUAD 50mo+IM (Fluarix, Fluzone & Alfiuria Quad PF) (Completed)      Flu vaccine today.  Lab work pending. Referral to neurology for evaluation of syncopal episodes. Warning signs reviewed.  Patient to seek help immediately if any new or worsening symptoms. Avoid sudden position changes such as standing as a precaution. Return if symptoms worsen or fail to improve.

## 2021-06-29 ENCOUNTER — Encounter: Payer: Self-pay | Admitting: Nurse Practitioner

## 2021-06-29 DIAGNOSIS — R55 Syncope and collapse: Secondary | ICD-10-CM | POA: Insufficient documentation

## 2021-07-05 LAB — COMPREHENSIVE METABOLIC PANEL
ALT: 31 IU/L (ref 0–32)
AST: 23 IU/L (ref 0–40)
Albumin/Globulin Ratio: 1.7 (ref 1.2–2.2)
Albumin: 4.5 g/dL (ref 3.9–5.0)
Alkaline Phosphatase: 82 IU/L (ref 44–121)
BUN/Creatinine Ratio: 9 (ref 9–23)
BUN: 6 mg/dL (ref 6–20)
Bilirubin Total: 0.8 mg/dL (ref 0.0–1.2)
CO2: 28 mmol/L (ref 20–29)
Calcium: 9.6 mg/dL (ref 8.7–10.2)
Chloride: 95 mmol/L — ABNORMAL LOW (ref 96–106)
Creatinine, Ser: 0.64 mg/dL (ref 0.57–1.00)
Globulin, Total: 2.6 g/dL (ref 1.5–4.5)
Glucose: 114 mg/dL — ABNORMAL HIGH (ref 70–99)
Potassium: 4.8 mmol/L (ref 3.5–5.2)
Sodium: 137 mmol/L (ref 134–144)
Total Protein: 7.1 g/dL (ref 6.0–8.5)
eGFR: 122 mL/min/{1.73_m2} (ref >59)

## 2021-07-05 LAB — CBC WITH DIFFERENTIAL/PLATELET
Basophils Absolute: 0 10*3/uL (ref 0.0–0.2)
Basos: 0 %
EOS (ABSOLUTE): 0 10*3/uL (ref 0.0–0.4)
Eos: 1 %
Hematocrit: 43.8 % (ref 34.0–46.6)
Hemoglobin: 14.2 g/dL (ref 11.1–15.9)
Immature Grans (Abs): 0 10*3/uL (ref 0.0–0.1)
Immature Granulocytes: 0 %
Lymphocytes Absolute: 1.8 10*3/uL (ref 0.7–3.1)
Lymphs: 31 %
MCH: 28.8 pg (ref 26.6–33.0)
MCHC: 32.4 g/dL (ref 31.5–35.7)
MCV: 89 fL (ref 79–97)
Monocytes Absolute: 0.3 10*3/uL (ref 0.1–0.9)
Monocytes: 4 %
Neutrophils Absolute: 3.7 10*3/uL (ref 1.4–7.0)
Neutrophils: 64 %
Platelets: 300 10*3/uL (ref 150–450)
RBC: 4.93 x10E6/uL (ref 3.77–5.28)
RDW: 13.1 % (ref 11.7–15.4)
WBC: 5.9 10*3/uL (ref 3.4–10.8)

## 2021-07-05 LAB — TSH: TSH: 3.36 u[IU]/mL (ref 0.450–4.500)

## 2021-07-06 ENCOUNTER — Encounter: Payer: Self-pay | Admitting: Family Medicine

## 2021-07-09 ENCOUNTER — Ambulatory Visit: Payer: Medicaid Other | Admitting: Family Medicine

## 2021-07-23 ENCOUNTER — Encounter (HOSPITAL_COMMUNITY): Payer: Self-pay

## 2021-07-23 ENCOUNTER — Emergency Department (HOSPITAL_COMMUNITY)
Admission: EM | Admit: 2021-07-23 | Discharge: 2021-07-24 | Disposition: A | Discharge status: Home / Self Care | Payer: Medicaid Other | Attending: Emergency Medicine | Admitting: Emergency Medicine

## 2021-07-23 ENCOUNTER — Emergency Department (HOSPITAL_COMMUNITY): Payer: Medicaid Other

## 2021-07-23 ENCOUNTER — Other Ambulatory Visit: Payer: Self-pay

## 2021-07-23 DIAGNOSIS — R109 Unspecified abdominal pain: Secondary | ICD-10-CM | POA: Diagnosis not present

## 2021-07-23 DIAGNOSIS — J45909 Unspecified asthma, uncomplicated: Secondary | ICD-10-CM | POA: Insufficient documentation

## 2021-07-23 MED ORDER — ONDANSETRON HCL 4 MG/2ML IJ SOLN
4.0000 mg | Freq: Once | INTRAMUSCULAR | Status: AC
  Administered 2021-07-23: 4 mg via INTRAVENOUS
  Filled 2021-07-23: qty 2

## 2021-07-23 MED ORDER — FENTANYL CITRATE PF 50 MCG/ML IJ SOSY
100.0000 ug | PREFILLED_SYRINGE | Freq: Once | INTRAMUSCULAR | Status: AC
  Administered 2021-07-23: 100 ug via INTRAVENOUS
  Filled 2021-07-23: qty 2

## 2021-07-23 MED ORDER — LACTATED RINGERS IV BOLUS
1000.0000 mL | Freq: Once | INTRAVENOUS | Status: AC
  Administered 2021-07-23: 1000 mL via INTRAVENOUS

## 2021-07-23 NOTE — ED Triage Notes (Signed)
Right flank pain since 5pm. Has a history of gallbladder problems. Has never had any kidney stones. Denies any urinary s/s. Tender to the touch.  Also c/o nausea.
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
## 2021-07-24 LAB — I-STAT BETA HCG BLOOD, ED (MC, WL, AP ONLY): I-stat hCG, quantitative: <5 m[IU]/mL (ref <5)

## 2021-07-24 LAB — CBC WITH DIFFERENTIAL/PLATELET
Abs Immature Granulocytes: 0.02 10*3/uL (ref 0.00–0.07)
Basophils Absolute: 0 10*3/uL (ref 0.0–0.1)
Basophils Relative: 0 %
Eosinophils Absolute: 0.1 10*3/uL (ref 0.0–0.5)
Eosinophils Relative: 1 %
HCT: 39.3 % (ref 36.0–46.0)
Hemoglobin: 13.4 g/dL (ref 12.0–15.0)
Immature Granulocytes: 0 %
Lymphocytes Relative: 23 %
Lymphs Abs: 2.4 10*3/uL (ref 0.7–4.0)
MCH: 30.1 pg (ref 26.0–34.0)
MCHC: 34.1 g/dL (ref 30.0–36.0)
MCV: 88.3 fL (ref 80.0–100.0)
Monocytes Absolute: 0.4 10*3/uL (ref 0.1–1.0)
Monocytes Relative: 4 %
Neutro Abs: 7.5 10*3/uL (ref 1.7–7.7)
Neutrophils Relative %: 72 %
Platelets: 309 10*3/uL (ref 150–400)
RBC: 4.45 MIL/uL (ref 3.87–5.11)
RDW: 12.6 % (ref 11.5–15.5)
WBC: 10.5 10*3/uL (ref 4.0–10.5)
nRBC: 0 % (ref 0.0–0.2)

## 2021-07-24 LAB — COMPREHENSIVE METABOLIC PANEL
ALT: 31 U/L (ref 0–44)
AST: 27 U/L (ref 15–41)
Albumin: 4 g/dL (ref 3.5–5.0)
Alkaline Phosphatase: 66 U/L (ref 38–126)
Anion gap: 8 (ref 5–15)
BUN: 10 mg/dL (ref 6–20)
CO2: 26 mmol/L (ref 22–32)
Calcium: 9 mg/dL (ref 8.9–10.3)
Chloride: 102 mmol/L (ref 98–111)
Creatinine, Ser: 0.54 mg/dL (ref 0.44–1.00)
GFR, Estimated: >60 mL/min (ref >60)
Glucose, Bld: 144 mg/dL — ABNORMAL HIGH (ref 70–99)
Potassium: 3.8 mmol/L (ref 3.5–5.1)
Sodium: 136 mmol/L (ref 135–145)
Total Bilirubin: 0.7 mg/dL (ref 0.3–1.2)
Total Protein: 7.7 g/dL (ref 6.5–8.1)

## 2021-07-24 LAB — LIPASE, BLOOD: Lipase: 23 U/L (ref 11–51)

## 2021-07-24 MED ORDER — ONDANSETRON 4 MG PO TBDP: ORAL_TABLET | ORAL | 0 refills | Status: DC

## 2021-07-24 MED ORDER — IOHEXOL 300 MG/ML  SOLN
100.0000 mL | Freq: Once | INTRAMUSCULAR | Status: AC | PRN
  Administered 2021-07-24: 100 mL via INTRAVENOUS

## 2021-07-24 MED ORDER — ONDANSETRON 4 MG PO TBDP: 4.0000 mg | ORAL_TABLET | Freq: Three times a day (TID) | ORAL | 0 refills | Status: DC | PRN

## 2021-08-02 ENCOUNTER — Ambulatory Visit: Payer: Medicaid Other | Admitting: Neurology

## 2021-08-02 ENCOUNTER — Encounter: Payer: Self-pay | Admitting: Neurology

## 2021-08-13 ENCOUNTER — Encounter: Payer: Self-pay | Admitting: Adult Health

## 2021-08-13 ENCOUNTER — Ambulatory Visit (INDEPENDENT_AMBULATORY_CARE_PROVIDER_SITE_OTHER): Payer: Medicaid Other | Admitting: Adult Health

## 2021-08-13 ENCOUNTER — Other Ambulatory Visit: Payer: Self-pay

## 2021-08-13 VITALS — BP 112/74 | HR 75 | Ht 61.0 in | Wt 233.0 lb

## 2021-08-13 DIAGNOSIS — Z3202 Encounter for pregnancy test, result negative: Secondary | ICD-10-CM | POA: Diagnosis not present

## 2021-08-13 DIAGNOSIS — Z3046 Encounter for surveillance of implantable subdermal contraceptive: Secondary | ICD-10-CM

## 2021-08-13 DIAGNOSIS — Z30017 Encounter for initial prescription of implantable subdermal contraceptive: Secondary | ICD-10-CM

## 2021-08-13 LAB — POCT URINE PREGNANCY: Preg Test, Ur: NEGATIVE

## 2021-08-13 MED ORDER — VALACYCLOVIR HCL 1 G PO TABS: ORAL_TABLET | ORAL | 3 refills | Status: DC

## 2021-08-13 MED ORDER — ETONOGESTREL 68 MG ~~LOC~~ IMPL
68.0000 mg | DRUG_IMPLANT | Freq: Once | SUBCUTANEOUS | Status: AC
Start: 2021-08-13 — End: 2021-08-13
  Administered 2021-08-13: 68 mg via SUBCUTANEOUS

## 2021-08-13 NOTE — Addendum Note (Signed)
Addended by: Linton Rump on: 08/13/2021 02:27 PM   Modules accepted: Orders

## 2021-08-13 NOTE — Progress Notes (Signed)
  Subjective:     Patient ID: Shannon Lin, female   DOB: 03-Jul-1991, 30 y.o.   MRN: [id number]  HPI Shannon Lin is a 30 year old white female, single, G0P0, in for nexplanon removal and reinsertion.   Lab Results  Component Value Date   DIAGPAP  07/23/2018    NEGATIVE FOR INTRAEPITHELIAL LESIONS OR MALIGNANCY.   PCP is Sallee Lange MD.   Review of Systems For nexplanon removal and reinsertion Reviewed past medical,surgical, social and family history. Reviewed medications and allergies.     Objective:   Physical Exam BP 112/74 (BP Location: Left Arm, Patient Position: Sitting, Cuff Size: Large)   Pulse 75   Ht 5\' 1"  (1.549 m)   Wt 233 lb (105.7 kg)   LMP 06/16/2021   BMI 44.02 kg/m     UPT is negative. Consent signed and time out called.Right arm cleansed with betadine, and injected with 1.5 cc 2% lidocaine and waited til numb.Under sterile technique a #11 blade was used to make small vertical incision, and a curved forceps was used to easily remove rod,and new rod easily inserted and palpated by provider and pt. Steri strips applied. Pressure dressing applied.  Fall risk is low.  Upstream - 08/13/21 1340       Pregnancy Intention Screening   Does the patient want to become pregnant in the next year? No    Does the patient's partner want to become pregnant in the next year? No    Would the patient like to discuss contraceptive options today? No      Contraception Wrap Up   Current Method Hormonal Implant    End Method Hormonal Implant             Assessment:     1. Pregnancy examination or test, negative result   2. Encounter for removal and reinsertion of Nexplanon Lot [id number]   Use condoms x 2 weeks, keep clean and dry x 24 hours, no heavy lifting, keep steri strips on x 72 hours, Keep pressure dressing on x 24 hours. Follow up prn problems.  Plan:     Return in 4 weeks for pap  Refilled valtrex at her request Meds ordered this encounter   Medications   valACYclovir (VALTREX) 1000 MG tablet    Sig: 2 pills bid for 1 day -fever blister    Dispense:  4 tablet    Refill:  3    Order Specific Question:   Supervising Provider    Answer:   Tania Ade H [[id number]]

## 2021-08-13 NOTE — Patient Instructions (Signed)
Use condoms x 2 weeks, keep clean and dry x 24 hours, no heavy lifting, keep steri strips on x 72 hours, Keep pressure dressing on x 24 hours. Follow up prn problems.  

## 2021-10-02 ENCOUNTER — Other Ambulatory Visit: Payer: Self-pay

## 2021-10-02 ENCOUNTER — Encounter: Payer: Self-pay | Admitting: Adult Health

## 2021-10-02 ENCOUNTER — Other Ambulatory Visit (HOSPITAL_COMMUNITY)
Admission: RE | Admit: 2021-10-02 | Discharge: 2021-10-02 | Disposition: A | Discharge status: Home / Self Care | Payer: Medicaid Other | Source: Ambulatory Visit | Attending: Adult Health | Admitting: Adult Health

## 2021-10-02 ENCOUNTER — Ambulatory Visit (INDEPENDENT_AMBULATORY_CARE_PROVIDER_SITE_OTHER): Payer: Medicaid Other | Admitting: Adult Health

## 2021-10-02 VITALS — BP 138/87 | HR 87 | Ht 61.0 in | Wt 233.0 lb

## 2021-10-02 DIAGNOSIS — Z975 Presence of (intrauterine) contraceptive device: Secondary | ICD-10-CM

## 2021-10-02 DIAGNOSIS — Z01419 Encounter for gynecological examination (general) (routine) without abnormal findings: Secondary | ICD-10-CM | POA: Diagnosis not present

## 2021-10-02 NOTE — Progress Notes (Signed)
Patient ID: Shannon Lin, female   DOB: 12-06-1990, 30 y.o.   MRN: [id number] History of Present Illness: Shannon Lin is a 30 year old white female, single, G0P0, in for a well woman gyn exam and pap. PCP is Dr Sallee Lange.   Current Medications, Allergies, Past Medical History, Past Surgical History, Family History and Social History were reviewed in [employer] record.     Review of Systems: Patient denies any headaches, hearing loss, fatigue, blurred vision, shortness of breath, chest pain, abdominal pain, problems with bowel movements, urination, or intercourse. No joint pain or mood swings.     Physical Exam:BP 138/87 (BP Location: Left Arm, Patient Position: Sitting, Cuff Size: Normal)    Pulse 87    Ht 5\' 1"  (1.549 m)    Wt 233 lb (105.7 kg)    BMI 44.02 kg/m   General:  Well developed, well nourished, no acute distress Skin:  Warm and dry Neck:  Midline trachea, normal thyroid, good ROM, no lymphadenopathy Lungs; Clear to auscultation bilaterally Breast:  No dominant palpable mass, retraction, or nipple discharge Cardiovascular: Regular rate and rhythm Abdomen:  Soft, non tender, no hepatosplenomegaly Pelvic:  External genitalia is normal in appearance, no lesions.  The vagina is normal in appearance. Urethra has no lesions or masses. The cervix is smooth, pap with HR HPV genotyping performed. Uterus is felt to be normal size, shape, and contour.  No adnexal masses or tenderness noted.Bladder is non tender, no masses felt. Rectal: Deferred  Extremities/musculoskeletal:  No swelling or varicosities noted, no clubbing or cyanosis Psych:  No mood changes, alert and cooperative,seems happy AA is 0  Fall risk is low Depression screen [facility] 2/9 10/02/2021 06/28/2021 06/04/2020  Decreased Interest 0 0 0  Down, Depressed, Hopeless 0 0 0  PHQ - 2 Score 0 0 0  Altered sleeping - - 1  Tired, decreased energy - - 1  Change in appetite - - 0  Feeling bad or failure  about yourself  - - 0  Trouble concentrating - - 0  Moving slowly or fidgety/restless - - 0  Suicidal thoughts - - 0  PHQ-9 Score - - 2  Difficult doing work/chores - - Not difficult at all    GAD 7 : Generalized Anxiety Score 10/02/2021 06/04/2020  Nervous, Anxious, on Edge 0 1  Control/stop worrying 0 0  Worry too much - different things 0 0  Trouble relaxing 0 1  Restless 0 0  Easily annoyed or irritable 0 0  Afraid - awful might happen 0 0  Total GAD 7 Score 0 2  Anxiety Difficulty Not difficult at all Not difficult at all      Upstream - 10/02/21 1427       Pregnancy Intention Screening   Does the patient want to become pregnant in the next year? No    Does the patient's partner want to become pregnant in the next year? No    Would the patient like to discuss contraceptive options today? No      Contraception Wrap Up   Current Method Hormonal Implant    End Method Hormonal Implant    Contraception Counseling Provided No            Examination chaperoned by Celene Squibb LPN.  Impression and Plan:  1. Encounter for gynecological examination with Papanicolaou smear of cervix Pap sent Physical in 1 year Pap in 3 years if normal   2. Nexplanon in place Placed 08/13/21

## 2021-10-02 NOTE — Progress Notes (Deleted)
Patient ID: Shannon Lin, female   DOB: 1991-01-19, 30 y.o.   MRN: [id number] .ftc

## 2021-10-09 LAB — CYTOLOGY - PAP
Comment: NEGATIVE
Diagnosis: NEGATIVE
High risk HPV: NEGATIVE

## 2021-10-30 ENCOUNTER — Encounter: Payer: Self-pay | Admitting: Family Medicine

## 2021-11-01 ENCOUNTER — Other Ambulatory Visit: Payer: Self-pay

## 2021-11-01 ENCOUNTER — Encounter: Payer: Self-pay | Admitting: Nurse Practitioner

## 2021-11-01 ENCOUNTER — Ambulatory Visit (INDEPENDENT_AMBULATORY_CARE_PROVIDER_SITE_OTHER): Payer: Medicaid Other | Admitting: Nurse Practitioner

## 2021-11-01 VITALS — BP 132/85 | HR 95 | Temp 98.2°F | Ht 61.0 in | Wt 232.0 lb

## 2021-11-01 DIAGNOSIS — K219 Gastro-esophageal reflux disease without esophagitis: Secondary | ICD-10-CM

## 2021-11-01 DIAGNOSIS — K921 Melena: Secondary | ICD-10-CM | POA: Diagnosis not present

## 2021-11-01 LAB — POCT HEMOGLOBIN: Hemoglobin: 12.8 g/dL (ref 11–14.6)

## 2021-11-01 MED ORDER — GUANFACINE HCL ER 3 MG PO TB24: ORAL_TABLET | ORAL | 2 refills | Status: DC

## 2021-11-01 MED ORDER — PROPRANOLOL HCL ER 80 MG PO CP24: 80.0000 mg | ORAL_CAPSULE | Freq: Every day | ORAL | 2 refills | Status: DC

## 2021-11-01 NOTE — Progress Notes (Signed)
° °  Subjective:    Patient ID: Shannon Lin, female    DOB: 03/24/1991, 31 y.o.   MRN: [id number]  HPI Black tarry stool x1 2 days ago, none since.  Denies any OTC meds or products.  She is only taking her prescribed medications.  No change in appetite.  No nausea or vomiting.  Has frequent stools which is a chronic problem occurs after everything she eats or drinks.  No obvious blood in her stool.  Some mild cramping at times.  No fevers.  Taking fluids well.  States her acid reflux symptoms are under good control with pantoprazole.  Last visit with GI specialist 04/03/2021.    Review of Systems     Objective:   Physical Exam NAD.  Alert, oriented.  Cheerful affect.  Lungs clear.  Heart regular rate and rhythm.  Abdomen obese nondistended with active bowel sounds x4.  Minimal upper abdominal tenderness mainly in the epigastric area to the left.  No rebound or guarding.  No evidence of severe pain. Results for orders placed or performed in visit on 11/01/21  POCT hemoglobin  Result Value Ref Range   Hemoglobin 12.8 11 - 14.6 g/dL   Today's Vitals   11/01/21 0921  BP: 132/85  Pulse: 95  Temp: 98.2 F (36.8 C)  SpO2: 96%  Weight: 232 lb (105.2 kg)  Height: 5\' 1"  (1.549 m)   Body mass index is 43.84 kg/m.  CT scan of the abdomen and pelvis with contrast 07/23/2021: Negative CT, gallbladder normal.  HIDA scan dated 02/09/2019 was normal.      Assessment & Plan:   Problem List Items Addressed This Visit       Digestive   GERD (gastroesophageal reflux disease)   Other Visit Diagnoses     Black stool    -  Primary   Relevant Orders   POCT hemoglobin (Completed)      Given Hemoccult cards to take home to test for blood in her stool.  Patient to bring this back to the office.  Since she has not had any problems in the past couple of days rectal exam was deferred today. Recommend follow-up with her GI specialist in the near future. Warning signs reviewed.   Patient  requested refills of her regular medications.  Meds ordered this encounter  Medications   GuanFACINE HCl 3 MG TB24    Sig: TAKE 1 TABLET(3 MG) BY MOUTH EVERY MORNING    Dispense:  30 tablet    Refill:  2   propranolol ER (INDERAL LA) 80 MG 24 hr capsule    Sig: Take 1 capsule (80 mg total) by mouth daily. To prevent migraine    Dispense:  30 capsule    Refill:  2   Recheck if worsens or persists.

## 2021-11-13 ENCOUNTER — Encounter: Payer: Self-pay | Admitting: Family Medicine

## 2021-11-14 ENCOUNTER — Other Ambulatory Visit: Payer: Self-pay | Admitting: Family Medicine

## 2021-11-15 ENCOUNTER — Other Ambulatory Visit: Payer: Self-pay | Admitting: Nurse Practitioner

## 2021-11-15 MED ORDER — BLOOD GLUCOSE METER KIT: PACK | 0 refills | Status: DC

## 2021-11-15 NOTE — Telephone Encounter (Signed)
[name] NP, has sent in prescription earlier.

## 2021-11-15 NOTE — Telephone Encounter (Signed)
Done

## 2021-11-26 ENCOUNTER — Encounter: Payer: Self-pay | Admitting: Nurse Practitioner

## 2021-12-01 ENCOUNTER — Encounter: Payer: Self-pay | Admitting: Family Medicine

## 2021-12-04 ENCOUNTER — Encounter: Payer: Self-pay | Admitting: Nurse Practitioner

## 2022-01-02 ENCOUNTER — Ambulatory Visit: Payer: Medicaid Other | Admitting: General Surgery

## 2022-01-19 ENCOUNTER — Encounter: Payer: Self-pay | Admitting: Family Medicine

## 2022-03-24 ENCOUNTER — Telehealth: Payer: Self-pay | Admitting: *Deleted

## 2022-03-24 NOTE — Telephone Encounter (Signed)
Received call from patient mother, Jenny Reichmann [phone] telephone.  Reports that patient has irritated internal hemorrhoid and passed blood clot last night. States that clot was dark maroon and approximately the size of a grape.   States that she is having rectal pain, and after passing clot, she was noted to be dizzy and lightheaded.   Advised if syncope continues, go to ER for evaluation. Advised if copious amounts of blood are being passed (3+ pads soaked through/ day) go to ER for evaluation.   Advised to use conservative care for hemorrhoid with sitz baths, creams/ pads.   Offered appointment with provider. Patient declined at this time.

## 2022-03-31 ENCOUNTER — Encounter: Payer: Self-pay | Admitting: Nurse Practitioner

## 2022-03-31 ENCOUNTER — Ambulatory Visit (INDEPENDENT_AMBULATORY_CARE_PROVIDER_SITE_OTHER): Payer: Medicaid Other | Admitting: Nurse Practitioner

## 2022-03-31 VITALS — BP 125/80 | HR 73 | Temp 98.1°F | Ht 61.0 in | Wt 223.2 lb

## 2022-03-31 DIAGNOSIS — M79645 Pain in left finger(s): Secondary | ICD-10-CM | POA: Diagnosis not present

## 2022-03-31 DIAGNOSIS — M25512 Pain in left shoulder: Secondary | ICD-10-CM | POA: Diagnosis not present

## 2022-03-31 DIAGNOSIS — E161 Other hypoglycemia: Secondary | ICD-10-CM | POA: Diagnosis not present

## 2022-04-01 LAB — CMP14+EGFR
ALT: 68 IU/L — ABNORMAL HIGH (ref 0–32)
AST: 45 IU/L — ABNORMAL HIGH (ref 0–40)
Albumin/Globulin Ratio: 1.6 (ref 1.2–2.2)
Albumin: 4.8 g/dL (ref 3.8–4.8)
Alkaline Phosphatase: 85 IU/L (ref 44–121)
BUN/Creatinine Ratio: 15 (ref 9–23)
BUN: 9 mg/dL (ref 6–20)
Bilirubin Total: 0.6 mg/dL (ref 0.0–1.2)
CO2: 24 mmol/L (ref 20–29)
Calcium: 9.8 mg/dL (ref 8.7–10.2)
Chloride: 101 mmol/L (ref 96–106)
Creatinine, Ser: 0.6 mg/dL (ref 0.57–1.00)
Globulin, Total: 3 g/dL (ref 1.5–4.5)
Glucose: 138 mg/dL — ABNORMAL HIGH (ref 70–99)
Potassium: 4.9 mmol/L (ref 3.5–5.2)
Sodium: 139 mmol/L (ref 134–144)
Total Protein: 7.8 g/dL (ref 6.0–8.5)
eGFR: 123 mL/min/{1.73_m2} (ref >59)

## 2022-04-01 LAB — CBC WITH DIFFERENTIAL/PLATELET
Basophils Absolute: 0 10*3/uL (ref 0.0–0.2)
Basos: 0 %
EOS (ABSOLUTE): 0.1 10*3/uL (ref 0.0–0.4)
Eos: 1 %
Hematocrit: 43.8 % (ref 34.0–46.6)
Hemoglobin: 14.5 g/dL (ref 11.1–15.9)
Immature Grans (Abs): 0 10*3/uL (ref 0.0–0.1)
Immature Granulocytes: 0 %
Lymphocytes Absolute: 2.2 10*3/uL (ref 0.7–3.1)
Lymphs: 28 %
MCH: 29.5 pg (ref 26.6–33.0)
MCHC: 33.1 g/dL (ref 31.5–35.7)
MCV: 89 fL (ref 79–97)
Monocytes Absolute: 0.4 10*3/uL (ref 0.1–0.9)
Monocytes: 6 %
Neutrophils Absolute: 5.1 10*3/uL (ref 1.4–7.0)
Neutrophils: 65 %
Platelets: 332 10*3/uL (ref 150–450)
RBC: 4.91 x10E6/uL (ref 3.77–5.28)
RDW: 13 % (ref 11.7–15.4)
WBC: 7.9 10*3/uL (ref 3.4–10.8)

## 2022-04-01 LAB — LIPID PANEL
Chol/HDL Ratio: 4.3 ratio (ref 0.0–4.4)
Cholesterol, Total: 164 mg/dL (ref 100–199)
HDL: 38 mg/dL — ABNORMAL LOW (ref >39)
LDL Chol Calc (NIH): 97 mg/dL (ref 0–99)
Triglycerides: 166 mg/dL — ABNORMAL HIGH (ref 0–149)
VLDL Cholesterol Cal: 29 mg/dL (ref 5–40)

## 2022-04-01 LAB — HEMOGLOBIN A1C
Est. average glucose Bld gHb Est-mCnc: 163 mg/dL
Hgb A1c MFr Bld: 7.3 % — ABNORMAL HIGH (ref 4.8–5.6)

## 2022-04-02 ENCOUNTER — Encounter: Payer: Self-pay | Admitting: Nurse Practitioner

## 2022-04-02 ENCOUNTER — Other Ambulatory Visit: Payer: Self-pay | Admitting: Nurse Practitioner

## 2022-04-02 MED ORDER — IBUPROFEN 600 MG PO TABS: 600.0000 mg | ORAL_TABLET | Freq: Three times a day (TID) | ORAL | 0 refills | Status: DC | PRN

## 2022-04-03 ENCOUNTER — Ambulatory Visit (HOSPITAL_COMMUNITY)
Admission: RE | Admit: 2022-04-03 | Discharge: 2022-04-03 | Disposition: A | Discharge status: Home / Self Care | Payer: Medicaid Other | Source: Ambulatory Visit | Attending: Nurse Practitioner | Admitting: Nurse Practitioner

## 2022-04-03 DIAGNOSIS — M79645 Pain in left finger(s): Secondary | ICD-10-CM | POA: Diagnosis present

## 2022-04-03 DIAGNOSIS — M25512 Pain in left shoulder: Secondary | ICD-10-CM | POA: Insufficient documentation

## 2022-04-11 ENCOUNTER — Other Ambulatory Visit: Payer: Self-pay | Admitting: Family Medicine

## 2022-04-16 ENCOUNTER — Encounter: Payer: Self-pay | Admitting: Nurse Practitioner

## 2022-04-17 ENCOUNTER — Ambulatory Visit
Admission: EM | Admit: 2022-04-17 | Discharge: 2022-04-17 | Disposition: A | Discharge status: Home / Self Care | Payer: Medicaid Other | Attending: Nurse Practitioner | Admitting: Nurse Practitioner

## 2022-04-17 ENCOUNTER — Encounter: Payer: Self-pay | Admitting: Radiology

## 2022-04-17 ENCOUNTER — Telehealth: Payer: Self-pay | Admitting: *Deleted

## 2022-04-17 ENCOUNTER — Encounter: Payer: Self-pay | Admitting: Emergency Medicine

## 2022-04-17 DIAGNOSIS — K644 Residual hemorrhoidal skin tags: Secondary | ICD-10-CM

## 2022-04-17 MED ORDER — HYDROCORTISONE (PERIANAL) 2.5 % EX CREA: 1.0000 | TOPICAL_CREAM | Freq: Two times a day (BID) | CUTANEOUS | 0 refills | Status: DC

## 2022-04-17 MED ORDER — SENNA 8.6 MG PO TABS: 2.0000 | ORAL_TABLET | Freq: Every day | ORAL | 0 refills | Status: AC

## 2022-04-17 MED ORDER — HYDROCORTISONE ACETATE 25 MG RE SUPP: 25.0000 mg | Freq: Two times a day (BID) | RECTAL | 0 refills | Status: DC

## 2022-04-17 NOTE — ED Triage Notes (Signed)
States she has an external hemorrhoid that been there since last year.  States she is not bleeding.

## 2022-04-17 NOTE — Telephone Encounter (Signed)
Received call from patient.   Patient reports that she has exacerbation of hemorrhoid and is protruding from rectum. Reports pain and inability to pass gas or BM. States that she has not had BM x2 days.   Requested appointment to discuss surgery with Dr. Constance Haw. Advised that next available appointment is in late August.   Given advise on conservative treatment. Advised if pain is severe, or if patient truly cannot pass gas, go to ER for evaluation.

## 2022-04-17 NOTE — Discharge Instructions (Addendum)
Take medication as prescribed. Increase fluids.  Try to drink at least 10-12 8 ounce glasses of water daily. Recommend a diet that is high in fiber to promote easier bowel movements. Recommend a warm sitz bath while symptoms persist. As discussed, please follow-up with gastroenterology next month for further evaluation.
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
## 2022-04-21 ENCOUNTER — Other Ambulatory Visit: Payer: Self-pay | Admitting: Nurse Practitioner

## 2022-04-21 DIAGNOSIS — K644 Residual hemorrhoidal skin tags: Secondary | ICD-10-CM

## 2022-04-22 ENCOUNTER — Ambulatory Visit (INDEPENDENT_AMBULATORY_CARE_PROVIDER_SITE_OTHER): Payer: Medicaid Other | Admitting: Orthopaedic Surgery

## 2022-04-22 ENCOUNTER — Encounter: Payer: Self-pay | Admitting: Orthopaedic Surgery

## 2022-04-22 VITALS — BP 147/69 | HR 86 | Ht 61.0 in | Wt 227.0 lb

## 2022-04-22 DIAGNOSIS — G8929 Other chronic pain: Secondary | ICD-10-CM | POA: Diagnosis not present

## 2022-04-22 DIAGNOSIS — M79645 Pain in left finger(s): Secondary | ICD-10-CM | POA: Diagnosis not present

## 2022-04-22 DIAGNOSIS — M25512 Pain in left shoulder: Secondary | ICD-10-CM
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
## 2022-04-22 NOTE — Patient Instructions (Signed)
Use Aspercreme or Voltaren Gel on your finger. If you have other creams such as icy hot or biofreeze you can use those first.  For your shoulder, take the Ibuprofen at least twice daily. If it starts to cause stomach issues, cut back on it.

## 2022-05-13 ENCOUNTER — Ambulatory Visit: Payer: Medicaid Other | Admitting: Orthopaedic Surgery

## 2022-05-29 ENCOUNTER — Ambulatory Visit: Payer: Medicaid Other | Admitting: General Surgery

## 2022-06-12 ENCOUNTER — Encounter: Payer: Self-pay | Admitting: General Surgery

## 2022-06-12 ENCOUNTER — Ambulatory Visit (INDEPENDENT_AMBULATORY_CARE_PROVIDER_SITE_OTHER): Payer: Medicaid Other | Admitting: General Surgery

## 2022-06-12 VITALS — BP 120/77 | HR 66 | Temp 98.9°F | Resp 16 | Ht 61.0 in | Wt 216.0 lb

## 2022-06-12 DIAGNOSIS — K642 Third degree hemorrhoids: Secondary | ICD-10-CM | POA: Diagnosis not present

## 2022-06-12 DIAGNOSIS — K641 Second degree hemorrhoids: Secondary | ICD-10-CM
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
## 2022-06-12 NOTE — Patient Instructions (Addendum)
Dates available for surgery: 9/13, 9/15, 9/18, 9/20, 9/21, 9/22, 9/27, /29, 10/2, 10/4, 10/5, 10/6  Surgical Procedures for Hemorrhoids Surgical procedures can be used to treat hemorrhoids. Hemorrhoids are swollen veins that are inside the rectum (internal hemorrhoids) or around the anus (external hemorrhoids). They are caused by increased pressure in the anal area. This pressure may result from straining to have a bowel movement (constipation), diarrhea, pregnancy, obesity, or sitting for long periods of time. Hemorrhoids can cause symptoms such as pain and bleeding. Surgery may be needed if diet changes, lifestyle changes, and other treatments do not help your symptoms. Common surgical methods that may be used include: Closed hemorrhoidectomy. The hemorrhoids are surgically removed, and the incisions are closed with stitches (sutures). Open hemorrhoidectomy. The hemorrhoids are surgically removed, but the incisions are allowed to heal without sutures. Stapled hemorrhoidectomy. The hemorrhoids are partially removed, and the incisions are closed with staples. Tell a health care provider about: Any allergies you have. All medicines you are taking, including vitamins, herbs, eye drops, creams, and over-the-counter medicines. Any problems you or family members have had with anesthetic medicines. Any blood disorders you have. Any surgeries you have had. Any medical conditions you have. Whether you are pregnant or may be pregnant. What are the risks? Generally, this is a safe procedure. However, problems may occur, including: Infection. Bleeding. Allergic reactions to medicines. Damage to other structures or organs. Pain. Constipation. Difficulty passing urine. Narrowing of the anal canal (stenosis). Difficulty controlling bowel movements (incontinence). Recurring hemorrhoids. A new passage (fistula) that forms between the anus or rectum and another area. What happens before the  procedure? Medicines Ask your health care provider about: Changing or stopping your regular medicines. This is especially important if you are taking diabetes medicines or blood thinners. Taking medicines such as aspirin and ibuprofen. These medicines can thin your blood. Do not take these medicines unless your health care provider tells you to take them. Taking over-the-counter medicines, vitamins, herbs, and supplements. General instructions You may need to have a procedure to examine the inside of your colon with a scope (colonoscopy). Your health care provider may do this to make sure that there are no other causes for your bleeding or pain. You may be instructed to take a laxative and an enema to clean out your colon before surgery (bowel prep). Carefully follow instructions from your health care provider about bowel prep. Plan to have someone take you home from the hospital or clinic. Plan to have a responsible adult care for you for at least 24 hours after you leave the hospital or clinic. This is important. Ask your health care provider: How your surgery site will be marked. What steps will be taken to help prevent infection. These may include: Washing skin with a germ-killing soap. Taking antibiotic medicine. What happens during the procedure? An IV will be inserted into one of your veins. You will be given one or more of the following: A medicine to help you relax (sedative). A medicine to numb the area (local anesthetic). A medicine to make you fall asleep (general anesthetic). A medicine that is injected into an area of your body to numb everything below the injection site (regional anesthetic). A lubricating jelly may be placed into your rectum. Your surgeon will insert a short scope (anoscope) into your rectum to examine the hemorrhoids. One of the following surgical methods will be used to remove the hemorrhoids: Closed hemorrhoidectomy. Your surgeon will use surgical  instruments to open the  tissue around the hemorrhoids. The veins that supply the hemorrhoids will be tied off with a suture. The hemorrhoids will be removed. The tissue that surrounds the hemorrhoids will be closed with sutures that your body can absorb (absorbable sutures). Open hemorrhoidectomy. The hemorrhoids will be removed with surgical instruments. The incisions will be left open to heal without sutures. Stapled hemorrhoidectomy. Your surgeon will use a circular stapling device to partially remove the hemorrhoids. The device will be inserted into your anus. It will remove a circular ring of tissue that includes hemorrhoid tissue and some tissue above the hemorrhoids. The staples in the device will close the edges of the tissue. This will cut off the blood supply to any remaining hemorrhoids and pull the tissue back into place. Each of these procedures may vary among health care providers and hospitals. What happens after the procedure? Your blood pressure, heart rate, breathing rate, and blood oxygen level may be monitored until you leave the hospital or clinic. You will be given pain medicine as needed. Do not drive for 24 hours if you were given a sedative during your procedure. Summary Surgery may be needed for hemorrhoids if diet changes, lifestyle changes, and other treatments do not help your symptoms. There are three common methods of surgery that are used to treat hemorrhoids. Follow instructions from your health care provider about taking medicines and about eating and drinking before the procedure. You may be instructed to take a laxative and an enema to clean out your colon before surgery (bowel prep). This information is not intended to replace advice given to you by your health care provider. Make sure you discuss any questions you have with your health care provider. Document Revised: 04/03/2021 Document Reviewed: 04/03/2021 Elsevier Patient Education  [place].

## 2022-06-18 ENCOUNTER — Encounter: Payer: Self-pay | Admitting: Nurse Practitioner

## 2022-06-26 NOTE — Patient Instructions (Signed)
Shannon Lin  06/26/2022     '@PREFPERIOPPHARMACY'$ @   Your procedure is scheduled on  07/02/2022.   Report to [facility] at  [place].   Call this number if you have problems the morning of surgery:  [phone]   Remember:  Do not eat or drink after midnight.      Take these medicines the morning of surgery with A SIP OF WATER     lamictal, antivert(if needed), zofran (if needed), protonix, propranolol.     Do not wear jewelry, make-up or nail polish.  Do not wear lotions, powders, or perfumes, or deodorant.  Do not shave 48 hours prior to surgery.  Men may shave face and neck.  Do not bring valuables to the hospital.  [facility] is not responsible for any belongings or valuables.  Contacts, dentures or bridgework may not be worn into surgery.  Leave your suitcase in the car.  After surgery it may be brought to your room.  For patients admitted to the hospital, discharge time will be determined by your treatment team.  Patients discharged the day of surgery will not be allowed to drive home and must have someone with them for 24 hours.    Special instructions:   DO NOT smoke tobacco or vape for 24 hours before you procedure.  Please read over the following fact sheets that you were given. Coughing and Deep Breathing, Surgical Site Infection Prevention, Anesthesia Post-op Instructions, and Care and Recovery After Surgery      Surgical Procedures for Hemorrhoids Surgical procedures can be used to treat hemorrhoids. Hemorrhoids are swollen veins that are inside the rectum (internal hemorrhoids) or around the anus (external hemorrhoids). They are caused by increased pressure in the anal area. This pressure may result from straining to have a bowel movement (constipation), diarrhea, pregnancy, obesity, or sitting for long periods of time. Hemorrhoids can cause symptoms such as pain and bleeding. Surgery may be needed if diet changes, lifestyle changes, and other  treatments do not help your symptoms. Common surgical methods that may be used include: Closed hemorrhoidectomy. The hemorrhoids are surgically removed, and the incisions are closed with stitches (sutures). Open hemorrhoidectomy. The hemorrhoids are surgically removed, but the incisions are allowed to heal without sutures. Stapled hemorrhoidectomy. The hemorrhoids are partially removed, and the incisions are closed with staples. Tell a health care provider about: Any allergies you have. All medicines you are taking, including vitamins, herbs, eye drops, creams, and over-the-counter medicines. Any problems you or family members have had with anesthetic medicines. Any blood disorders you have. Any surgeries you have had. Any medical conditions you have. Whether you are pregnant or may be pregnant. What are the risks? Generally, this is a safe procedure. However, problems may occur, including: Infection. Bleeding. Allergic reactions to medicines. Damage to other structures or organs. Pain. Constipation. Difficulty passing urine. Narrowing of the anal canal (stenosis). Difficulty controlling bowel movements (incontinence). Recurring hemorrhoids. A new passage (fistula) that forms between the anus or rectum and another area. What happens before the procedure? Medicines Ask your health care provider about: Changing or stopping your regular medicines. This is especially important if you are taking diabetes medicines or blood thinners. Taking medicines such as aspirin and ibuprofen. These medicines can thin your blood. Do not take these medicines unless your health care provider tells you to take them. Taking over-the-counter medicines, vitamins, herbs, and supplements. Staying hydrated Follow instructions from your health care provider  about hydration, which may include: Up to 2 hours before the procedure - you may continue to drink clear liquids, such as water, clear fruit juice, black  coffee, and plain tea.  Eating and drinking Follow instructions from your health care provider about eating and drinking, which may include: 8 hours before the procedure - stop eating heavy meals or foods, such as meat, fried foods, or fatty foods. 6 hours before the procedure - stop eating light meals or foods, such as toast or cereal. 6 hours before the procedure - stop drinking milk or drinks that contain milk. 2 hours before the procedure - stop drinking clear liquids. General instructions You may need to have a procedure to examine the inside of your colon with a scope (colonoscopy). Your health care provider may do this to make sure that there are no other causes for your bleeding or pain. You may be instructed to take a laxative and an enema to clean out your colon before surgery (bowel prep). Carefully follow instructions from your health care provider about bowel prep. Plan to have someone take you home from the hospital or clinic. Plan to have a responsible adult care for you for at least 24 hours after you leave the hospital or clinic. This is important. Ask your health care provider: How your surgery site will be marked. What steps will be taken to help prevent infection. These may include: Washing skin with a germ-killing soap. Taking antibiotic medicine. What happens during the procedure? An IV will be inserted into one of your veins. You will be given one or more of the following: A medicine to help you relax (sedative). A medicine to numb the area (local anesthetic). A medicine to make you fall asleep (general anesthetic). A medicine that is injected into an area of your body to numb everything below the injection site (regional anesthetic). A lubricating jelly may be placed into your rectum. Your surgeon will insert a short scope (anoscope) into your rectum to examine the hemorrhoids. One of the following surgical methods will be used to remove the hemorrhoids: Closed  hemorrhoidectomy. Your surgeon will use surgical instruments to open the tissue around the hemorrhoids. The veins that supply the hemorrhoids will be tied off with a suture. The hemorrhoids will be removed. The tissue that surrounds the hemorrhoids will be closed with sutures that your body can absorb (absorbable sutures). Open hemorrhoidectomy. The hemorrhoids will be removed with surgical instruments. The incisions will be left open to heal without sutures. Stapled hemorrhoidectomy. Your surgeon will use a circular stapling device to partially remove the hemorrhoids. The device will be inserted into your anus. It will remove a circular ring of tissue that includes hemorrhoid tissue and some tissue above the hemorrhoids. The staples in the device will close the edges of the tissue. This will cut off the blood supply to any remaining hemorrhoids and pull the tissue back into place. Each of these procedures may vary among health care providers and hospitals. What happens after the procedure? Your blood pressure, heart rate, breathing rate, and blood oxygen level may be monitored until you leave the hospital or clinic. You will be given pain medicine as needed. Do not drive for 24 hours if you were given a sedative during your procedure. Summary Surgery may be needed for hemorrhoids if diet changes, lifestyle changes, and other treatments do not help your symptoms. There are three common methods of surgery that are used to treat hemorrhoids. Follow instructions from your health  care provider about taking medicines and about eating and drinking before the procedure. You may be instructed to take a laxative and an enema to clean out your colon before surgery (bowel prep). This information is not intended to replace advice given to you by your health care provider. Make sure you discuss any questions you have with your health care provider. Document Revised: 04/03/2021 Document Reviewed:  04/03/2021 Elsevier Patient Education  [place] Anesthesia, Adult, Care After The following information offers guidance on how to care for yourself after your procedure. Your health care provider may also give you more specific instructions. If you have problems or questions, contact your health care provider. What can I expect after the procedure? After the procedure, it is common for people to: Have pain or discomfort at the IV site. Have nausea or vomiting. Have a sore throat or hoarseness. Have trouble concentrating. Feel cold or chills. Feel weak, sleepy, or tired (fatigue). Have soreness and body aches. These can affect parts of the body that were not involved in surgery. Follow these instructions at home: For the time period you were told by your health care provider:  Rest. Do not participate in activities where you could fall or become injured. Do not drive or use machinery. Do not drink alcohol. Do not take sleeping pills or medicines that cause drowsiness. Do not make important decisions or sign legal documents. Do not take care of children on your own. General instructions Drink enough fluid to keep your urine pale yellow. If you have sleep apnea, surgery and certain medicines can increase your risk for breathing problems. Follow instructions from your health care provider about wearing your sleep device: Anytime you are sleeping, including during daytime naps. While taking prescription pain medicines, sleeping medicines, or medicines that make you drowsy. Return to your normal activities as told by your health care provider. Ask your health care provider what activities are safe for you. Take over-the-counter and prescription medicines only as told by your health care provider. Do not use any products that contain nicotine or tobacco. These products include cigarettes, chewing tobacco, and vaping devices, such as e-cigarettes. These can delay incision  healing after surgery. If you need help quitting, ask your health care provider. Contact a health care provider if: You have nausea or vomiting that does not get better with medicine. You vomit every time you eat or drink. You have pain that does not get better with medicine. You cannot urinate or have bloody urine. You develop a skin rash. You have a fever. Get help right away if: You have trouble breathing. You have chest pain. You vomit blood. These symptoms may be an emergency. Get help right away. Call 911. Do not wait to see if the symptoms will go away. Do not drive yourself to the hospital. Summary After the procedure, it is common to have a sore throat, hoarseness, nausea, vomiting, or to feel weak, sleepy, or fatigue. For the time period you were told by your health care provider, do not drive or use machinery. Get help right away if you have difficulty breathing, have chest pain, or vomit blood. These symptoms may be an emergency. This information is not intended to replace advice given to you by your health care provider. Make sure you discuss any questions you have with your health care provider. Document Revised: 12/20/2021 Document Reviewed: 12/20/2021 Elsevier Patient Education  [place].

## 2022-06-30 ENCOUNTER — Encounter (HOSPITAL_COMMUNITY)
Admission: RE | Admit: 2022-06-30 | Discharge: 2022-06-30 | Disposition: A | Discharge status: Home / Self Care | Payer: Medicaid Other | Source: Ambulatory Visit | Attending: General Surgery | Admitting: General Surgery

## 2022-06-30 ENCOUNTER — Encounter (HOSPITAL_COMMUNITY): Payer: Self-pay

## 2022-06-30 VITALS — BP 108/70 | HR 95 | Temp 98.9°F | Resp 18 | Ht 61.0 in | Wt 216.1 lb

## 2022-06-30 DIAGNOSIS — Z01818 Encounter for other preprocedural examination: Secondary | ICD-10-CM

## 2022-06-30 DIAGNOSIS — Z01812 Encounter for preprocedural laboratory examination: Secondary | ICD-10-CM | POA: Diagnosis present

## 2022-06-30 HISTORY — DX: Gastro-esophageal reflux disease without esophagitis: K21.9

## 2022-06-30 HISTORY — DX: Prediabetes: R73.03

## 2022-06-30 LAB — POCT PREGNANCY, URINE: Preg Test, Ur: NEGATIVE
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
## 2022-07-02 ENCOUNTER — Ambulatory Visit (HOSPITAL_COMMUNITY): Payer: Medicaid Other | Admitting: Anesthesiology

## 2022-07-02 ENCOUNTER — Encounter (HOSPITAL_COMMUNITY): Payer: Self-pay | Admitting: General Surgery

## 2022-07-02 ENCOUNTER — Encounter (HOSPITAL_COMMUNITY)
Admission: RE | Disposition: A | Discharge status: Home / Self Care | Payer: Self-pay | Source: Ambulatory Visit | Attending: General Surgery

## 2022-07-02 ENCOUNTER — Ambulatory Visit (HOSPITAL_BASED_OUTPATIENT_CLINIC_OR_DEPARTMENT_OTHER): Payer: Medicaid Other | Admitting: Anesthesiology

## 2022-07-02 ENCOUNTER — Ambulatory Visit (HOSPITAL_COMMUNITY)
Admission: RE | Admit: 2022-07-02 | Discharge: 2022-07-02 | Disposition: A | Discharge status: Home / Self Care | Payer: Medicaid Other | Source: Ambulatory Visit | Attending: General Surgery | Admitting: General Surgery

## 2022-07-02 DIAGNOSIS — K648 Other hemorrhoids: Secondary | ICD-10-CM | POA: Diagnosis not present

## 2022-07-02 DIAGNOSIS — K219 Gastro-esophageal reflux disease without esophagitis: Secondary | ICD-10-CM | POA: Diagnosis not present

## 2022-07-02 DIAGNOSIS — N760 Acute vaginitis: Secondary | ICD-10-CM

## 2022-07-02 DIAGNOSIS — B9689 Other specified bacterial agents as the cause of diseases classified elsewhere: Secondary | ICD-10-CM | POA: Insufficient documentation

## 2022-07-02 DIAGNOSIS — K644 Residual hemorrhoidal skin tags: Secondary | ICD-10-CM | POA: Insufficient documentation

## 2022-07-02 DIAGNOSIS — K642 Third degree hemorrhoids: Secondary | ICD-10-CM | POA: Diagnosis not present

## 2022-07-02 DIAGNOSIS — J45909 Unspecified asthma, uncomplicated: Secondary | ICD-10-CM

## 2022-07-02 DIAGNOSIS — R569 Unspecified convulsions: Secondary | ICD-10-CM | POA: Diagnosis not present

## 2022-07-02 DIAGNOSIS — F329 Major depressive disorder, single episode, unspecified: Secondary | ICD-10-CM | POA: Insufficient documentation

## 2022-07-02 HISTORY — PX: HEMORRHOID SURGERY: SHX153

## 2022-07-02 LAB — GLUCOSE, CAPILLARY
Glucose-Capillary: 135 mg/dL — ABNORMAL HIGH (ref 70–99)
Glucose-Capillary: 182 mg/dL — ABNORMAL HIGH (ref 70–99)

## 2022-07-02 SURGERY — HEMORRHOIDECTOMY
Anesthesia: General | Site: Rectum

## 2022-07-02 MED ORDER — CHLORHEXIDINE GLUCONATE CLOTH 2 % EX PADS: 6.0000 | MEDICATED_PAD | Freq: Once | CUTANEOUS | Status: DC

## 2022-07-02 MED ORDER — BUPIVACAINE LIPOSOME 1.3 % IJ SUSP
INTRAMUSCULAR | Status: DC | PRN
  Administered 2022-07-02 (×2): 20 mL

## 2022-07-02 MED ORDER — LIDOCAINE VISCOUS HCL 2 % MT SOLN
OROMUCOSAL | Status: DC | PRN
  Administered 2022-07-02: 1 via OROMUCOSAL

## 2022-07-02 MED ORDER — BUPIVACAINE LIPOSOME 1.3 % IJ SUSP
INTRAMUSCULAR | Status: AC
  Filled 2022-07-02: qty 20

## 2022-07-02 MED ORDER — FENTANYL CITRATE PF 50 MCG/ML IJ SOSY: 25.0000 ug | PREFILLED_SYRINGE | INTRAMUSCULAR | Status: DC | PRN

## 2022-07-02 MED ORDER — MIDAZOLAM HCL 5 MG/5ML IJ SOLN
INTRAMUSCULAR | Status: DC | PRN
  Administered 2022-07-02: 2 mg via INTRAVENOUS

## 2022-07-02 MED ORDER — PROPOFOL 10 MG/ML IV BOLUS
INTRAVENOUS | Status: DC | PRN
  Administered 2022-07-02: 200 mg via INTRAVENOUS

## 2022-07-02 MED ORDER — LACTATED RINGERS IV SOLN: INTRAVENOUS | Status: DC

## 2022-07-02 MED ORDER — LIDOCAINE HCL (CARDIAC) PF 100 MG/5ML IV SOSY
PREFILLED_SYRINGE | INTRAVENOUS | Status: DC | PRN
  Administered 2022-07-02: 100 mg via INTRAVENOUS

## 2022-07-02 MED ORDER — LIDOCAINE VISCOUS HCL 2 % MT SOLN
OROMUCOSAL | Status: AC
  Filled 2022-07-02: qty 15

## 2022-07-02 MED ORDER — MIDAZOLAM HCL 2 MG/2ML IJ SOLN
INTRAMUSCULAR | Status: AC
  Filled 2022-07-02: qty 2

## 2022-07-02 MED ORDER — OXYCODONE HCL 5 MG/5ML PO SOLN: 5.0000 mg | Freq: Once | ORAL | Status: DC | PRN

## 2022-07-02 MED ORDER — LACTATED RINGERS IV SOLN: INTRAVENOUS | Status: DC | PRN

## 2022-07-02 MED ORDER — CHLORHEXIDINE GLUCONATE 0.12 % MT SOLN: 15.0000 mL | Freq: Once | OROMUCOSAL | Status: DC

## 2022-07-02 MED ORDER — CHLORHEXIDINE GLUCONATE 0.12 % MT SOLN
OROMUCOSAL | Status: AC
  Filled 2022-07-02: qty 15

## 2022-07-02 MED ORDER — ONDANSETRON HCL 4 MG/2ML IJ SOLN
INTRAMUSCULAR | Status: DC | PRN
  Administered 2022-07-02: 4 mg via INTRAVENOUS

## 2022-07-02 MED ORDER — FENTANYL CITRATE (PF) 100 MCG/2ML IJ SOLN
INTRAMUSCULAR | Status: DC | PRN
  Administered 2022-07-02: 50 ug via INTRAVENOUS
  Administered 2022-07-02: 100 ug via INTRAVENOUS

## 2022-07-02 MED ORDER — ONDANSETRON HCL 4 MG PO TABS: 4.0000 mg | ORAL_TABLET | Freq: Three times a day (TID) | ORAL | 1 refills | Status: DC | PRN

## 2022-07-02 MED ORDER — FENTANYL CITRATE (PF) 250 MCG/5ML IJ SOLN
INTRAMUSCULAR | Status: AC
  Filled 2022-07-02: qty 5

## 2022-07-02 MED ORDER — DEXAMETHASONE SODIUM PHOSPHATE 10 MG/ML IJ SOLN
INTRAMUSCULAR | Status: DC | PRN
  Administered 2022-07-02: 5 mg via INTRAVENOUS

## 2022-07-02 MED ORDER — OXYCODONE HCL 5 MG PO TABS: 5.0000 mg | ORAL_TABLET | ORAL | 0 refills | Status: DC | PRN

## 2022-07-02 MED ORDER — SEVOFLURANE IN SOLN
RESPIRATORY_TRACT | Status: AC
  Filled 2022-07-02: qty 250

## 2022-07-02 MED ORDER — ORAL CARE MOUTH RINSE: 15.0000 mL | Freq: Once | OROMUCOSAL | Status: DC

## 2022-07-02 MED ORDER — PROPOFOL 10 MG/ML IV BOLUS
INTRAVENOUS | Status: AC
  Filled 2022-07-02: qty 20

## 2022-07-02 MED ORDER — SODIUM CHLORIDE 0.9 % IR SOLN
Status: DC | PRN
  Administered 2022-07-02: 1

## 2022-07-02 MED ORDER — OXYCODONE HCL 5 MG PO TABS: 5.0000 mg | ORAL_TABLET | Freq: Once | ORAL | Status: DC | PRN

## 2022-07-02 MED ORDER — ONDANSETRON HCL 4 MG/2ML IJ SOLN: 4.0000 mg | Freq: Once | INTRAMUSCULAR | Status: DC | PRN

## 2022-07-02 MED ORDER — NUVESSA 1.3 % VA GEL: 1.0000 | Freq: Every evening | VAGINAL | 1 refills | Status: AC

## 2022-07-02 SURGICAL SUPPLY — 31 items
BAG HAMPER (MISCELLANEOUS) ×2 IMPLANT
CLOTH BEACON ORANGE TIMEOUT ST (SAFETY) ×2 IMPLANT
COVER LIGHT HANDLE STERIS (MISCELLANEOUS) ×4 IMPLANT
DRAPE HALF SHEET 40X57 (DRAPES) ×4 IMPLANT
ELECT REM PT RETURN 9FT ADLT (ELECTROSURGICAL) ×1
ELECTRODE REM PT RTRN 9FT ADLT (ELECTROSURGICAL) ×2 IMPLANT
GAUZE 4X4 16PLY ~~LOC~~+RFID DBL (SPONGE) ×2 IMPLANT
GAUZE SPONGE 4X4 12PLY STRL (GAUZE/BANDAGES/DRESSINGS) ×2 IMPLANT
GLOVE BIO SURGEON STRL SZ 6.5 (GLOVE) ×2 IMPLANT
GLOVE BIOGEL PI IND STRL 6.5 (GLOVE) ×2 IMPLANT
GLOVE BIOGEL PI IND STRL 7.0 (GLOVE) ×4 IMPLANT
GOWN STRL REUS W/TWL LRG LVL3 (GOWN DISPOSABLE) ×4 IMPLANT
HEMOSTAT SURGICEL 4X8 (HEMOSTASIS) ×2 IMPLANT
KIT TURNOVER CYSTO (KITS) ×2 IMPLANT
LIGASURE IMPACT 36 18CM CVD LR (INSTRUMENTS) IMPLANT
MANIFOLD NEPTUNE II (INSTRUMENTS) ×2 IMPLANT
NDL HYPO 18GX1.5 BLUNT FILL (NEEDLE) ×2 IMPLANT
NDL HYPO 21X1.5 SAFETY (NEEDLE) ×2 IMPLANT
NEEDLE HYPO 18GX1.5 BLUNT FILL (NEEDLE) ×1 IMPLANT
NEEDLE HYPO 21X1.5 SAFETY (NEEDLE) ×1 IMPLANT
NS IRRIG 1000ML POUR BTL (IV SOLUTION) ×2 IMPLANT
PACK PERI GYN (CUSTOM PROCEDURE TRAY) ×2 IMPLANT
PAD ARMBOARD 7.5X6 YLW CONV (MISCELLANEOUS) ×2 IMPLANT
SET BASIN LINEN APH (SET/KITS/TRAYS/PACK) ×2 IMPLANT
SHEARS HARMONIC 9CM CVD (BLADE) IMPLANT
SPONGE SURGIFOAM ABS GEL 100 (HEMOSTASIS) ×2 IMPLANT
SURGILUBE 2OZ TUBE FLIPTOP (MISCELLANEOUS) ×2 IMPLANT
SUT SILK 0 FSL (SUTURE) ×2 IMPLANT
SUT VIC AB 2-0 CT2 27 (SUTURE) IMPLANT
SYR 20ML LL LF (SYRINGE) ×4 IMPLANT
SYR BULB IRRIG 60ML STRL (SYRINGE) ×2 IMPLANT

## 2022-07-02 NOTE — Anesthesia Preprocedure Evaluation (Addendum)
Anesthesia Evaluation  Patient identified by MRN, date of birth, ID band Patient awake    Reviewed: Allergy & Precautions, H&P , NPO status , Patient's Chart, lab work & pertinent test results, reviewed documented beta blocker date and time   Airway Mallampati: II  TM Distance: >3 FB Neck ROM: full    Dental no notable dental hx.    Pulmonary asthma ,    Pulmonary exam normal breath sounds clear to auscultation       Cardiovascular Exercise Tolerance: Good negative cardio ROS   Rhythm:regular Rate:Normal     Neuro/Psych  Headaches, Seizures -, Well Controlled,  PSYCHIATRIC DISORDERS Anxiety Bipolar Disorder    GI/Hepatic Neg liver ROS, GERD  Medicated,  Endo/Other  Morbid obesity  Renal/GU negative Renal ROS  negative genitourinary   Musculoskeletal   Abdominal   Peds  Hematology negative hematology ROS (+)   Anesthesia Other Findings   Reproductive/Obstetrics negative OB ROS                            Anesthesia Physical Anesthesia Plan  ASA: 3  Anesthesia Plan: General and General LMA   Post-op Pain Management:    Induction:   PONV Risk Score and Plan: Ondansetron  Airway Management Planned:   Additional Equipment:   Intra-op Plan:   Post-operative Plan:   Informed Consent: I have reviewed the patients History and Physical, chart, labs and discussed the procedure including the risks, benefits and alternatives for the proposed anesthesia with the patient or authorized representative who has indicated his/her understanding and acceptance.     Dental Advisory Given  Plan Discussed with: CRNA  Anesthesia Plan Comments:        Anesthesia Quick Evaluation

## 2022-07-02 NOTE — Discharge Instructions (Addendum)
Discharge Instructions: Take the [facility] application as prescribed vaginally for the bacterial vaginosis. Nuvessa changes the pH of your vagina back to more acidic which is normal. You could get a yeast infection, and if you develop this please call the office for oral diflucan.   Please take your roxicodone as prescribed and alternate with tylenol every 4-6 hours.   Do not take any aspirin or NSAIDs, ibuprofen, aleve, BC powder for 5 days.  After 07/07/2022 you can start taking these medications again.  Please keep the area clean and dry and take Sitz baths (swallow warm water baths) for comfort and after bowel movements.  If you cannot get in a bath tub, you can purchase a Sitz bath that goes on the toilet at the pharmacy.  Please keep your stools soft and take fiber daily (metamucil) and colace (over the counter) to help prevent constipation.  If you have not had a BM in 2 days, please take Miralax, and if you have not had a BM after this, please notify Dr. Constance Haw.  Expect some bleeding following the hemorrhoid surgery and significant pain.  Go to the ED with extensive bleeding (soaking 2 large pads in < 1 hour), fevers, or chills.    Shower per your regular routine daily.   Rest and listen to your body, but do not remain in bed all day.  Walk everyday for at least 15-20 minutes. Deep cough and move around every 1-2 hours in the first few days after surgery.  Do not lift > 10 lbs, perform excessive bending, pushing, pulling, squatting. You will have pressure and pain at your anus and this is common. You may want to obtain a donut pillow from your pharmacy to sit on.   Some nausea is common and poor appetite. The main goal is to stay hydrated the first few days after surgery.   Pain Expectations and Narcotics: -After surgery you will have pain associated with your surgery and this is normal. The pain is muscular and nerve pain, and will get better with time. -You are encouraged and expected to  take non narcotic medications like tylenol and ibuprofen (when able) to treat pain as multiple modalities can aid with pain treatment. -Narcotics are only used when pain is severe or there is breakthrough pain. -You are not expected to have a pain score of 0 after surgery, as we cannot prevent pain. A pain score of 3-4 that allows you to be functional, move, walk, and tolerate some activity is the goal. The pain will continue to improve over the days after surgery and is dependent on your surgery. -Due to [place] law, we are only able to give a certain amount of pain medication to treat post operative pain, and we only give additional narcotics on a patient by patient basis.  -For most laparoscopic surgery, studies have shown that the majority of patients only need 10-15 narcotic pills, and for open surgeries or anal surgeries most patients only need 15-20.   -Having appropriate expectations of pain and knowledge of pain management with non narcotics is important as we do not want anyone to become addicted to narcotic pain medication.  -Doing your Sitz baths will help with pain and pressure discomfort.  -Simple acts like meditation and mindfulness practices after surgery can also help with pain control and research has proven the benefit of these practices.  Contact Information: If you have questions or concerns, please call our office, [phone], Monday- Thursday 8AM-5PM and Friday 8AM-12Noon.  If  it is after hours or on the weekend, please call [facility] Main Number, [phone], [phone], and ask to speak to the surgeon on call for Dr. Constance Haw at [facility].

## 2022-07-02 NOTE — Interval H&P Note (Signed)
History and Physical Interval Note:  07/02/2022 10:05 AM  Shannon Lin  has presented today for surgery, with the diagnosis of Internal hemorrhoids External hemorrhoids.  The various methods of treatment have been discussed with the patient and family. After consideration of risks, benefits and other options for treatment, the patient has consented to  Procedure(s) with comments: HEMORRHOIDECTOMY; EXTENSIVE (N/A) - pt knows to arrive at 8:30 as a surgical intervention.  The patient's history has been reviewed, patient examined, no change in status, stable for surgery.  I have reviewed the patient's chart and labs.  Questions were answered to the patient's satisfaction.    Having some twinge pains, has a history of fissure, could have this again, has been using her ointment. Discussed post op plans. Virl Cagey

## 2022-07-02 NOTE — Op Note (Signed)
[facility] Operative Note  07/02/22  Preoperative Diagnosis: Grade III hemorrhoids    Postoperative Diagnosis: Grade III hemorrhoids, bacterial vaginosis    Procedure(s) Performed: Extensive hemorrhoidectomy X 2 (internal/ external columns), X 1 lateral internal column    Surgeon: Lanell Matar. Constance Haw, MD   Assistants: No qualified resident was available    Anesthesia: General endotracheal   Anesthesiologist: Louann Sjogren, MD    Specimens: Hemorrhoid columns    Estimated Blood Loss: Minimal   Blood Replacement: None    Complications: None   Wound Class: Contaminated    Operative Indications: [name] is a 31 yo who comes in with hemorrhoid and a history of thrombosed hemorrhoids. We discussed hemorrhoidectomy and risk of bleeding, infection, incomplete removal, injury to sphincter complex, recurrence, and anal stenosis.   Findings: Large right posterior column extending over midline to the left; small right anterior column, internal left lateral hemorrhoid column    Procedure: The patient was taken to the operating room and placed supine. General endotracheal anesthesia was induced.  She was then placed in lithotomy with all pressure points padded. Intravenous antibiotics were not administered per protocol.  The perineal and perianal area were prepared and draped in the usual sterile fashion.   The vaginal introitus had a copious amount of white drainage and a gauze was used to control this.   An internal anal exam was done and no masses were noted. The right posterior column was the largest extending over midline and bulging. The anoscope was inserted and no fissure was noted. The right anterior column was smaller and the left lateral column only had an internal component.  Using Debakey forceps the hemorrhoid columns were elevated and the skin was incised on the right posterior column, the hemorrhoid column with internal and external component was taken  with a ligasure, ensuring the sphincter complex was protected. An adequate skin bridge was left in the right side and the right anterior column was taken in a similar fashion. The only visible column on the left was a internal column that was taken with the ligasure. Hemostasis was achieved. Exparel was injected and a gelfoam with Surgicel was tagged with a silk and soaked in viscous lidocaine and inserted into the rectum.   Given the amount of vaginal discharge, I asked Dr. Elonda Husky, GYN, to come in as he was here and available. A vaginal speculum was inserted and confirmed the diagnosis of a bacterial vaginosis and he told me the correct prescriptions to give her for this issue.   Final inspection revealed acceptable hemostasis. All counts were correct at the end of the case. The patient was awakened from anesthesia and extubated without complication.  The patient went to the PACU in stable condition.   Curlene Labrum, MD [facility] [address] [place], [place] [address] [phone] (office)

## 2022-07-02 NOTE — Anesthesia Procedure Notes (Signed)
Procedure Name: LMA Insertion Date/Time: 07/02/2022 10:22 AM  Performed by: Jonna Munro, CRNAPre-anesthesia Checklist: Patient identified, Emergency Drugs available, Suction available, Patient being monitored and Timeout performed Patient Re-evaluated:Patient Re-evaluated prior to induction Oxygen Delivery Method: Circle system utilized Preoxygenation: Pre-oxygenation with 100% oxygen Induction Type: IV induction LMA: LMA inserted LMA Size: 4.0 Number of attempts: 1 Placement Confirmation: positive ETCO2, breath sounds checked- equal and bilateral and CO2 detector Tube secured with: Tape

## 2022-07-02 NOTE — Transfer of Care (Signed)
Immediate Anesthesia Transfer of Care Note  Patient: Shirlee More  Procedure(s) Performed: HEMORRHOIDECTOMY; EXTENSIVE (Rectum)  Patient Location: PACU  Anesthesia Type:General  Level of Consciousness: awake, alert , oriented and patient cooperative  Airway & Oxygen Therapy: Patient Spontanous Breathing and Patient connected to face mask oxygen  Post-op Assessment: Report given to RN, Post -op Vital signs reviewed and stable and Patient moving all extremities X 4  Post vital signs: Reviewed and stable  Last Vitals:  Vitals Value Taken Time  BP    Temp    Pulse 99 07/02/22 1123  Resp 18 07/02/22 1123  SpO2 93 % 07/02/22 1123  Vitals shown include unvalidated device data.  Last Pain:  Vitals:   07/02/22 0854  TempSrc: Oral  PainSc: 0-No pain         Complications: No notable events documented.

## 2022-07-02 NOTE — Progress Notes (Signed)
[facility]  Updated her mother. Rx sent to [facility]. Incidentally noted to have bacterial vaginosis overgrowth. Maebelle Munroe application prescribed and a refill for repeat if needed in 7 days if continued symptoms.   Patient at risk of yeast infection, per Dr. Elonda Husky. If develops any signs of yeast infection, will need to call the office and we can prescribe diflucan orally.  Curlene Labrum, MD [facility] [address] [place], [place] [address] [phone] (office)

## 2022-07-03 NOTE — Anesthesia Postprocedure Evaluation (Signed)
Anesthesia Post Note  Patient: Shannon Lin  Procedure(s) Performed: HEMORRHOIDECTOMY; EXTENSIVE (Rectum)  Patient location during evaluation: Phase II Anesthesia Type: General Level of consciousness: awake Pain management: pain level controlled Vital Signs Assessment: post-procedure vital signs reviewed and stable Respiratory status: spontaneous breathing and respiratory function stable Cardiovascular status: blood pressure returned to baseline and stable Postop Assessment: no headache and no apparent nausea or vomiting Anesthetic complications: no Comments: Late entry   No notable events documented.   Last Vitals:  Vitals:   07/02/22 1200 07/02/22 1226  BP: 120/86 105/63  Pulse: 87 70  Resp: 14 (!) 21  Temp:  36.4 C  SpO2: 90% 93%    Last Pain:  Vitals:   07/02/22 1226  TempSrc: Axillary  PainSc: 0-No pain                 Louann Sjogren

## 2022-07-04 LAB — SURGICAL PATHOLOGY

## 2022-07-09 ENCOUNTER — Encounter (HOSPITAL_COMMUNITY): Payer: Self-pay | Admitting: General Surgery

## 2022-07-10 ENCOUNTER — Other Ambulatory Visit: Payer: Self-pay | Admitting: Family Medicine

## 2022-07-28 ENCOUNTER — Telehealth: Payer: Self-pay | Admitting: *Deleted

## 2022-07-28 ENCOUNTER — Encounter: Payer: Self-pay | Admitting: *Deleted

## 2022-07-28 NOTE — Telephone Encounter (Signed)
Surgical Date: 07/02/2022 Procedure: Hemorrhoidectomy, Extensive  Received call from patient [phone]~ telephone.   Reports that she has had x3 episodes of noticing white discoloration in stool. States that stool itself is not discolored, but there are white areas in the stool. States that she has not eaten anything that would cause discoloration. Denies abdominal pain, swelling, anal itching or pain.   Dr. Constance Haw made aware and requested photo via [place]. Call placed to patient and patient made aware.

## 2022-07-28 NOTE — Telephone Encounter (Signed)
MyChart picture routed to provider.

## 2022-07-29 ENCOUNTER — Encounter: Payer: Self-pay | Admitting: General Surgery

## 2022-07-29 ENCOUNTER — Ambulatory Visit (INDEPENDENT_AMBULATORY_CARE_PROVIDER_SITE_OTHER): Payer: Medicaid Other | Admitting: General Surgery

## 2022-07-29 VITALS — BP 124/73 | HR 74 | Temp 98.2°F | Resp 12 | Ht 61.0 in | Wt 205.0 lb

## 2022-07-29 DIAGNOSIS — K642 Third degree hemorrhoids: Secondary | ICD-10-CM

## 2022-07-30 ENCOUNTER — Encounter: Payer: Self-pay | Admitting: Nurse Practitioner

## 2022-07-30 NOTE — Progress Notes (Signed)
[facility]  Doing well. Feels like she has a little tag still but getting better. Had some white material in the stool but this is unrelated to the hemorrhoidectomy.    BP 124/73   Pulse 74   Temp 98.2 F (36.8 C) (Oral)   Resp 12   Ht '5\' 1"'$  (1.549 m)   Wt 205 lb (93 kg)   SpO2 95%   BMI 38.73 kg/m  Healing hemorrhoidectomy site  Patient s/p hemorrhoidectomy. Doing well.  Continue sitz baths Keep stools soft and regular  Future Appointments  Date Time Provider [place]  09/02/2022 10:45 AM Virl Cagey, MD [facility] None   Curlene Labrum, MD [facility] [address] [place], [place] [address] [phone] (office)

## 2022-08-18 ENCOUNTER — Encounter (INDEPENDENT_AMBULATORY_CARE_PROVIDER_SITE_OTHER): Payer: Self-pay | Admitting: Gastroenterology

## 2022-08-20 ENCOUNTER — Encounter: Payer: Self-pay | Admitting: Nurse Practitioner

## 2022-08-22 ENCOUNTER — Ambulatory Visit: Payer: Medicaid Other | Admitting: Nurse Practitioner

## 2022-08-22 VITALS — BP 117/74 | HR 79 | Temp 98.8°F | Ht 61.0 in | Wt 207.0 lb

## 2022-08-22 DIAGNOSIS — Z23 Encounter for immunization: Secondary | ICD-10-CM

## 2022-08-22 DIAGNOSIS — L918 Other hypertrophic disorders of the skin: Secondary | ICD-10-CM

## 2022-08-22 DIAGNOSIS — R000 Tachycardia, unspecified: Secondary | ICD-10-CM | POA: Diagnosis not present

## 2022-08-22 MED ORDER — PHENTERMINE HCL 37.5 MG PO TABS: 37.5000 mg | ORAL_TABLET | Freq: Every day | ORAL | 0 refills | Status: DC
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
## 2022-08-23 ENCOUNTER — Encounter: Payer: Self-pay | Admitting: Nurse Practitioner

## 2022-08-23 DIAGNOSIS — L918 Other hypertrophic disorders of the skin: Secondary | ICD-10-CM | POA: Insufficient documentation

## 2022-08-30 ENCOUNTER — Encounter: Payer: Self-pay | Admitting: Family Medicine

## 2022-09-01 ENCOUNTER — Telehealth: Payer: Self-pay | Admitting: *Deleted

## 2022-09-01 NOTE — Telephone Encounter (Signed)
Please see other mychart message

## 2022-09-01 NOTE — Telephone Encounter (Signed)
Surgical Date: 07/02/2022 Procedure: Hemorrhoidectomy  Received call from patient [phone]~ telephone.   Patient reports illness and requested to cancel upcoming appointment.   States that she is healing well from procedure.   Dr.Bridges to be made aware.

## 2022-09-02 ENCOUNTER — Ambulatory Visit
Admission: EM | Admit: 2022-09-02 | Discharge: 2022-09-02 | Disposition: A | Discharge status: Home / Self Care | Payer: Medicaid Other | Attending: Family Medicine | Admitting: Family Medicine

## 2022-09-02 ENCOUNTER — Encounter: Payer: Medicaid Other | Admitting: General Surgery

## 2022-09-02 DIAGNOSIS — R0981 Nasal congestion: Secondary | ICD-10-CM

## 2022-09-02 DIAGNOSIS — H6592 Unspecified nonsuppurative otitis media, left ear: Secondary | ICD-10-CM

## 2022-09-02 MED ORDER — PREDNISONE 50 MG PO TABS: ORAL_TABLET | ORAL | 0 refills | Status: DC

## 2022-09-02 MED ORDER — FLUTICASONE PROPIONATE 50 MCG/ACT NA SUSP: 1.0000 | Freq: Two times a day (BID) | NASAL | 2 refills | Status: DC

## 2022-09-02 MED ORDER — CETIRIZINE HCL 10 MG PO TABS: 10.0000 mg | ORAL_TABLET | Freq: Every day | ORAL | 2 refills | Status: DC
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
## 2022-09-02 NOTE — ED Triage Notes (Signed)
Pt reports nasal congestion and hearing decrease in left ear after Flu shot on  08/22/22.

## 2022-09-03 ENCOUNTER — Other Ambulatory Visit: Payer: Self-pay | Admitting: Family Medicine

## 2022-10-07 ENCOUNTER — Encounter: Payer: Self-pay | Admitting: Nurse Practitioner

## 2022-10-08 NOTE — Telephone Encounter (Signed)
Called patient left message on voicemail to call office.

## 2022-10-17 ENCOUNTER — Encounter: Payer: Self-pay | Admitting: Family Medicine

## 2022-10-17 ENCOUNTER — Other Ambulatory Visit: Payer: Self-pay | Admitting: Nurse Practitioner

## 2022-10-17 MED ORDER — MEBENDAZOLE 100 MG PO CHEW: 100.0000 mg | CHEWABLE_TABLET | Freq: Once | ORAL | 0 refills | Status: AC

## 2022-10-20 ENCOUNTER — Ambulatory Visit (INDEPENDENT_AMBULATORY_CARE_PROVIDER_SITE_OTHER): Payer: Medicaid Other | Admitting: Adult Health

## 2022-10-20 ENCOUNTER — Encounter: Payer: Self-pay | Admitting: Adult Health

## 2022-10-20 VITALS — BP 121/72 | HR 76 | Ht 61.0 in | Wt 205.4 lb

## 2022-10-20 DIAGNOSIS — Z3046 Encounter for surveillance of implantable subdermal contraceptive: Secondary | ICD-10-CM | POA: Diagnosis not present

## 2022-10-20 DIAGNOSIS — M25621 Stiffness of right elbow, not elsewhere classified: Secondary | ICD-10-CM | POA: Insufficient documentation

## 2022-10-20 DIAGNOSIS — Z975 Presence of (intrauterine) contraceptive device: Secondary | ICD-10-CM

## 2022-10-20 NOTE — Progress Notes (Signed)
  Subjective:     Patient ID: Shannon Lin, female   DOB: [birth year], 32 y.o.   MRN: [id number]  HPI Zarie is a 32 year old white female,single, but engaged, G0P0, in complaining of stiffness at times right upper arm, wonders if related to nexplanon. She is getting gardasil, soon and TDAP.  Last pap was negative HPV,NILM 10/02/21  PCP is Dr Sallee Lange  Review of Systems Has stiffness at times in right upper arm was wondering if related to nexplanon Reviewed past medical,surgical, social and family history. Reviewed medications and allergies.     Objective:   Physical Exam BP 121/72 (BP Location: Left Arm, Patient Position: Sitting, Cuff Size: Normal)   Pulse 76   Ht '5\' 1"'$  (1.549 m)   Wt 205 lb 6.4 oz (93.2 kg)   BMI 38.81 kg/m     Skin warm and dry.Pelvic: external genitalia is normal in appearance no lesions, vagina: white discharge with odor,urethra has no lesions or masses noted, cervix:smooth and bulbous, uterus: normal size, shape and contour, non tender, no masses felt, adnexa: no masses or tenderness noted. Bladder is non tender and no masses felt. Wet prep: + for clue cells and +WBCs. GC/CHL obtained.   Assessment:     1. Nexplanon in place Placed 08/13/21 Easily palpated No tenderness  Can remove whenever she wants  2. Stiffness of right upper arm joint Do not sleep that arm    Plan:     Follow up in 6 months for follow up

## 2022-10-21 ENCOUNTER — Ambulatory Visit: Payer: Medicaid Other | Admitting: Adult Health

## 2022-10-22 ENCOUNTER — Encounter: Payer: Self-pay | Admitting: Nurse Practitioner

## 2022-10-24 ENCOUNTER — Telehealth: Payer: Self-pay

## 2022-10-24 NOTE — Telephone Encounter (Signed)
Spoke with patient via telephone patient denies seeing in worms in toilet and constipation, no blood in stools. Patient states that she is having stomach cramps as if she was on her cycle. Patient also states that her symptoms are coming from the medication.   Advised patient per [name] that patient can make appointment for her next  week with [facility].  If symptoms get worse she can call the office early in the week to be seen earlier.

## 2022-10-27 ENCOUNTER — Emergency Department (HOSPITAL_COMMUNITY)
Admission: EM | Admit: 2022-10-27 | Discharge: 2022-10-27 | Disposition: A | Discharge status: Home / Self Care | Payer: Medicaid Other | Attending: Emergency Medicine | Admitting: Emergency Medicine

## 2022-10-27 ENCOUNTER — Other Ambulatory Visit: Payer: Self-pay

## 2022-10-27 ENCOUNTER — Encounter: Payer: Self-pay | Admitting: Nurse Practitioner

## 2022-10-27 ENCOUNTER — Encounter (HOSPITAL_COMMUNITY): Payer: Self-pay | Admitting: Emergency Medicine

## 2022-10-27 ENCOUNTER — Emergency Department (HOSPITAL_COMMUNITY): Payer: Medicaid Other

## 2022-10-27 DIAGNOSIS — Z7984 Long term (current) use of oral hypoglycemic drugs: Secondary | ICD-10-CM | POA: Diagnosis not present

## 2022-10-27 DIAGNOSIS — Z79899 Other long term (current) drug therapy: Secondary | ICD-10-CM | POA: Insufficient documentation

## 2022-10-27 DIAGNOSIS — R1084 Generalized abdominal pain: Secondary | ICD-10-CM | POA: Diagnosis not present

## 2022-10-27 DIAGNOSIS — R109 Unspecified abdominal pain: Secondary | ICD-10-CM | POA: Diagnosis not present

## 2022-10-27 DIAGNOSIS — R197 Diarrhea, unspecified: Secondary | ICD-10-CM | POA: Insufficient documentation

## 2022-10-27 LAB — URINALYSIS, ROUTINE W REFLEX MICROSCOPIC
Bilirubin Urine: NEGATIVE
Glucose, UA: NEGATIVE mg/dL
Hgb urine dipstick: NEGATIVE
Ketones, ur: NEGATIVE mg/dL
Nitrite: NEGATIVE
Protein, ur: NEGATIVE mg/dL
Specific Gravity, Urine: 1.021 (ref 1.005–1.030)
pH: 5 (ref 5.0–8.0)

## 2022-10-27 LAB — COMPREHENSIVE METABOLIC PANEL
ALT: 30 U/L (ref 0–44)
AST: 24 U/L (ref 15–41)
Albumin: 4.3 g/dL (ref 3.5–5.0)
Alkaline Phosphatase: 76 U/L (ref 38–126)
Anion gap: 9 (ref 5–15)
BUN: 9 mg/dL (ref 6–20)
CO2: 27 mmol/L (ref 22–32)
Calcium: 9.2 mg/dL (ref 8.9–10.3)
Chloride: 99 mmol/L (ref 98–111)
Creatinine, Ser: 0.64 mg/dL (ref 0.44–1.00)
GFR, Estimated: >60 mL/min (ref >60)
Glucose, Bld: 139 mg/dL — ABNORMAL HIGH (ref 70–99)
Potassium: 4 mmol/L (ref 3.5–5.1)
Sodium: 135 mmol/L (ref 135–145)
Total Bilirubin: 0.9 mg/dL (ref 0.3–1.2)
Total Protein: 8.3 g/dL — ABNORMAL HIGH (ref 6.5–8.1)

## 2022-10-27 LAB — CBC
HCT: 40.9 % (ref 36.0–46.0)
Hemoglobin: 13.6 g/dL (ref 12.0–15.0)
MCH: 29.2 pg (ref 26.0–34.0)
MCHC: 33.3 g/dL (ref 30.0–36.0)
MCV: 88 fL (ref 80.0–100.0)
Platelets: 315 10*3/uL (ref 150–400)
RBC: 4.65 MIL/uL (ref 3.87–5.11)
RDW: 12.8 % (ref 11.5–15.5)
WBC: 8.8 10*3/uL (ref 4.0–10.5)
nRBC: 0 % (ref 0.0–0.2)

## 2022-10-27 LAB — LIPASE, BLOOD: Lipase: 29 U/L (ref 11–51)

## 2022-10-27 LAB — PREGNANCY, URINE: Preg Test, Ur: NEGATIVE

## 2022-10-27 NOTE — Telephone Encounter (Signed)
error 

## 2022-10-27 NOTE — Discharge Instructions (Addendum)
You were seen in the emergency department for abdominal pain and diarrhea tonight. All of your labs were within normal margins and CT scan of your abdomen did not show any signs of bowel obstruction or other abdominal pathology at this time. Your urinalysis was concerning for possible UTI, but given that you are not experiencing symptoms at this time, there is no need for treatment with antibiotics.  Manage your symptoms at home with proper fluids hydration and imodium if needed for diarrhea.

## 2022-10-27 NOTE — Telephone Encounter (Signed)
Patient's mother called and stated that the patient seen another pinn warm in the toilet and she is unable to keep any food down. Consulted with LPN and advised patient per LPN referred to ED.

## 2022-10-27 NOTE — ED Provider Notes (Signed)
[facility]   CSN: [id number] Arrival date & time: 10/27/22  1656     History Chief Complaint  Patient presents with   Abdominal Pain    Shannon Lin is a 32 y.o. female.   Abdominal Pain Associated symptoms: no chills, no cough, no diarrhea, no fever, no nausea and no vomiting   Patient presents emergency department with complaints of abdominal pain that began yesterday.  She reports that she has been on treatment for pinworms for several days now.  Patient denies any nausea, diarrhea.  Has some associated vomiting.  Patient denies any known sick contacts that she has been around.  Denies any urinary symptoms such as dysuria, hematuria, increased urinary frequency. Patient recently diagnosed with pinworms and has been taking medications for this but denies any side effects or reactions to medication.     Home Medications Prior to Admission medications   Medication Sig Start Date End Date Taking? Authorizing Provider  ARIPiprazole (ABILIFY) 5 MG tablet Take 1 tablet (5 mg total) by mouth at bedtime. 02/04/21   Kathyrn Drown, MD  blood glucose meter kit and supplies Dispense based on patient and insurance preference. Use up to twice daily as directed. (FOR ICD-10 E16.1, R73.03). 11/15/21   Nilda Simmer, NP  cetirizine (ZYRTEC ALLERGY) 10 MG tablet Take 1 tablet (10 mg total) by mouth daily. 09/02/22   Volney American, PA-C  divalproex (DEPAKOTE ER) 500 MG 24 hr tablet TAKE 2 TABLETS BY MOUTH EVERY NIGHT AT BEDTIME 02/04/21   Kathyrn Drown, MD  etonogestrel (NEXPLANON) 68 MG IMPL implant 1 each by Subdermal route once.    [provider]  GuanFACINE HCl 3 MG TB24 TAKE 1 TABLET(3 MG) BY MOUTH EVERY MORNING Patient not taking: Reported on 10/20/2022 11/01/21   Nilda Simmer, NP  hydrocortisone (ANUSOL-HC) 2.5 % rectal cream Place 1 Application rectally 2 (two) times daily. Patient not taking: Reported  on 10/20/2022 04/17/22   Leath-Warren, Alda Lea, NP  hydrocortisone (ANUSOL-HC) 25 MG suppository Place 1 suppository (25 mg total) rectally 2 (two) times daily. Patient not taking: Reported on 10/20/2022 04/17/22   Leath-Warren, Alda Lea, NP  ibuprofen (ADVIL) 600 MG tablet Take 1 tablet (600 mg total) by mouth every 8 (eight) hours as needed. 04/02/22   Ameduite, Trenton Gammon, FNP  lamoTRIgine (LAMICTAL) 200 MG tablet TAKE 1 TABLET(200 MG) BY MOUTH DAILY Patient not taking: Reported on 10/20/2022 02/04/21   Kathyrn Drown, MD  meclizine (ANTIVERT) 25 MG tablet Take 1 tablet (25 mg total) by mouth 3 (three) times daily as needed for dizziness. 03/08/32   Delora Fuel, MD  metFORMIN (GLUCOPHAGE) 500 MG tablet TAKE 1 TABLET BY MOUTH DAILY 04/11/22   Kathyrn Drown, MD  ondansetron (ZOFRAN) 4 MG tablet Take 1 tablet (4 mg total) by mouth every 8 (eight) hours as needed. 07/02/22 07/02/23  Virl Cagey, MD  pantoprazole (PROTONIX) 40 MG tablet TAKE 1 TABLET(40 MG) BY MOUTH DAILY 09/04/22   Kathyrn Drown, MD  phentermine (ADIPEX-P) 37.5 MG tablet Take 1 tablet (37.5 mg total) by mouth daily before breakfast. 08/22/22   Nilda Simmer, NP  propranolol ER (INDERAL LA) 80 MG 24 hr capsule Take 1 capsule (80 mg total) by mouth daily. To prevent migraine 11/01/21   Nilda Simmer, NP  valACYclovir (VALTREX) 1000 MG tablet 2 pills bid for 1 day -fever blister 08/13/21   Estill Dooms,  NP      Allergies    Patient has no known allergies.    Review of Systems   Review of Systems  Constitutional:  Negative for chills and fever.  Respiratory:  Negative for cough and chest tightness.   Gastrointestinal:  Positive for abdominal pain. Negative for diarrhea, nausea and vomiting.  All other systems reviewed and are negative.   Physical Exam Updated Vital Signs BP 125/60   Pulse 85   Temp 98.7 F (37.1 C) (Oral)   Resp 18   Ht '5\' 1"'$  (1.549 m)   Wt 89.8 kg   SpO2 97%   BMI 37.41 kg/m   Physical Exam Vitals and nursing note reviewed.  Constitutional:      General: She is not in acute distress.    Appearance: She is well-developed. She is obese. She is not ill-appearing.  HENT:     Head: Normocephalic and atraumatic.  Cardiovascular:     Rate and Rhythm: Normal rate and regular rhythm.  Pulmonary:     Effort: Pulmonary effort is normal.     Breath sounds: Normal breath sounds.  Abdominal:     General: Abdomen is flat. Bowel sounds are normal. There is no distension or abdominal bruit.     Palpations: Abdomen is soft. There is no mass.     Tenderness: There is generalized abdominal tenderness.  Neurological:     Mental Status: She is alert.     ED Results / Procedures / Treatments   Labs (all labs ordered are listed, but only abnormal results are displayed) Labs Reviewed  COMPREHENSIVE METABOLIC PANEL - Abnormal; Notable for the following components:      Result Value   Glucose, Bld 139 (*)    Total Protein 8.3 (*)    All other components within normal limits  URINALYSIS, ROUTINE W REFLEX MICROSCOPIC - Abnormal; Notable for the following components:   APPearance HAZY (*)    Leukocytes,Ua MODERATE (*)    Bacteria, UA RARE (*)    All other components within normal limits  LIPASE, BLOOD  CBC  PREGNANCY, URINE    EKG None  Radiology No results found.  Procedures Procedures   Medications Ordered in ED Medications - No data to display  ED Course/ Medical Decision Making/ A&P                           Medical Decision Making Amount and/or Complexity of Data Reviewed Labs: ordered. Radiology: ordered.   This patient presents to the ED for concern of abdominal pain and diarrhea.  Differential diagnosis includes gastroenteritis, small bowel obstruction, constipation   Lab Tests:  I Ordered, and personally interpreted labs.  The pertinent results include: Normal CBC CMP, urinalysis shows evidence of UTI with moderate leukocytes and some  bacteria seen   Imaging Studies ordered:  I ordered imaging studies including CT abdomen I independently visualized and interpreted imaging which showed no acute abdominal abnormalities and no evidence of bowel obstruction I agree with the radiologist interpretation   Medicines ordered and prescription drug management:  I have reviewed the patients home medicines and have made adjustments as needed   Problem List / ED Course:  Patient was seen in the emergency department for abdominal pain and diarrhea. Patient reports she was recently diagnosed with pinworms but reports that abdominal pain began one day ago. Patient had difficulty describing abdominal pain or location but reported it was significant. CT abdomen was reassuring  without evidence of acute abdominal abnormalities. Advised patient that UA was concerning for possible UTI, but given that no symptoms were present that she did not require antibiotic treatment at this. Patient was agreeable with plan to discharge home and verbalized understanding return precautions.  Final Clinical Impression(s) / ED Diagnoses Final diagnoses:  Generalized abdominal pain  Diarrhea, unspecified type    Rx / DC Orders ED Discharge Orders     None         Luvenia Heller, PA-C 11/01/22 0008    Godfrey Pick, MD 11/06/22 1323

## 2022-10-27 NOTE — ED Triage Notes (Signed)
Pt presents with abdominal pain with N/V/D, x 2 weeks recently diagnosed with pin worms and completed 1 dose antibiotic, sent by PCP, to r/o SBO.

## 2022-10-28 ENCOUNTER — Telehealth: Payer: Self-pay

## 2022-10-28 NOTE — Telephone Encounter (Signed)
Transition Care Management Follow-up Telephone Call Date of discharge and from where: 10[facility]  How have you been since you were released from the hospital? Stable  Any questions or concerns? No  Items Reviewed: Did the pt receive and understand the discharge instructions provided? Yes  Medications obtained and verified?  None  Other? No  Any new allergies since your discharge? No  Dietary orders reviewed? Yes Do you have support at home? Yes    Follow up appointments reviewed:  PCP Hospital f/u appt confirmed?  Would like to hold on scheduling appointment at this time   . Are transportation arrangements needed? No  If their condition worsens, is the pt aware to call PCP or go to the Emergency Dept.? Yes Was the patient provided with contact information for the PCP's office or ED? Yes Was to pt encouraged to call back with questions or concerns? Yes

## 2022-11-11 ENCOUNTER — Encounter: Payer: Self-pay | Admitting: Family Medicine

## 2022-11-12 NOTE — Telephone Encounter (Signed)
Sent patient a MyChart message to call and schedule an appointment.

## 2022-11-17 ENCOUNTER — Encounter: Payer: Self-pay | Admitting: Nurse Practitioner

## 2022-11-21 ENCOUNTER — Telehealth: Payer: Self-pay

## 2022-11-21 NOTE — Telephone Encounter (Signed)
Patient states that she is having polyuria after passing the kidney stone. Patient states no blood in urine. Patient declines appointment for next week. Patient wanted [name] to known that the pharmacy declined the HPV vaccine due to insurance.

## 2022-11-22 NOTE — Telephone Encounter (Signed)
Noted  

## 2022-11-28 ENCOUNTER — Ambulatory Visit (INDEPENDENT_AMBULATORY_CARE_PROVIDER_SITE_OTHER): Payer: Medicaid Other | Admitting: Family Medicine

## 2022-11-28 VITALS — BP 135/89 | Wt 207.4 lb

## 2022-11-28 DIAGNOSIS — R300 Dysuria: Secondary | ICD-10-CM | POA: Diagnosis not present

## 2022-11-28 LAB — POCT URINALYSIS DIP (CLINITEK)
Spec Grav, UA: 1.01 (ref 1.010–1.025)
pH, UA: 8 (ref 5.0–8.0)

## 2022-11-28 MED ORDER — CEPHALEXIN 500 MG PO CAPS: 500.0000 mg | ORAL_CAPSULE | Freq: Three times a day (TID) | ORAL | 0 refills | Status: DC

## 2022-11-28 MED ORDER — ONDANSETRON 8 MG PO TBDP: 8.0000 mg | ORAL_TABLET | Freq: Three times a day (TID) | ORAL | 0 refills | Status: DC | PRN

## 2022-11-28 NOTE — Progress Notes (Signed)
   Subjective:    Patient ID: Shannon Lin, female    DOB: [birth year], 32 y.o.   MRN: [id number]  HPI Patient to follow up on kidney stone passed on Nov 27, 2022. Patient states her back and stomach is still hurting her. Patient relates that she was seen in the ER and had x-rays CT scan lab work and then she passed kidney stone she is surprised that that now she is having dysuria and urinary frequency.  Denies flank pain    Review of Systems     Objective:   Physical Exam Lungs clear heart regular abdomen is soft no guarding or rebound   UA with epithelial cells some WBCs but unfortunately looks like a contaminated urine    Assessment & Plan:  Will go ahead with treatment Keflex 5 days Warning signs discussed Patient to bring [place] any kidney stones that she passed No need for x-ray lab work currently Patient not toxic follow-up if any ongoing troubles Urine culture to be sent

## 2022-12-01 LAB — URINE CULTURE

## 2022-12-04 ENCOUNTER — Encounter: Payer: Self-pay | Admitting: Radiology

## 2022-12-06 ENCOUNTER — Encounter: Payer: Self-pay | Admitting: Family Medicine

## 2022-12-08 ENCOUNTER — Telehealth: Payer: Self-pay

## 2022-12-08 ENCOUNTER — Other Ambulatory Visit: Payer: Self-pay

## 2022-12-08 DIAGNOSIS — K921 Melena: Secondary | ICD-10-CM

## 2022-12-08 MED ORDER — CEPHALEXIN 500 MG PO CAPS: 500.0000 mg | ORAL_CAPSULE | Freq: Three times a day (TID) | ORAL | 0 refills | Status: DC

## 2022-12-08 NOTE — Telephone Encounter (Signed)
So at this time I would recommend holding off on any antibiotics until she submits another urine to do a urine culture  Very important when she submits his urine that it is a good clean-catch-in other words do not assume that the patient knows how to do a clean-catch please instruct her how to do so-then if the urine shows an infection we will treat it if the urine does not show an infection we will recommend sending her back to urology thank you

## 2022-12-08 NOTE — Telephone Encounter (Signed)
Nurses May go ahead and prescribe Keflex 500 mg 1 taken 3 times daily for 5 days for UTI symptoms  May go ahead with referral to gastroenterology-it would be beneficial if the patient could give more details regarding blood in the stool. Frequency? Severity-in other words does not seem like a lot or a little? How long is it been going on for?  Please go ahead with referral

## 2022-12-08 NOTE — Telephone Encounter (Signed)
Patient states she's had frequent urination since mid February after passing a kidney stone. She states she cannot take keflex because it seems to cause more blood in her stool. She sees the stool about 3 /5  x per week on the tissue. Referral placed to gastroenterology

## 2022-12-08 NOTE — Telephone Encounter (Signed)
Patient has been made aware per drs notes and recommendations.

## 2022-12-09 ENCOUNTER — Encounter (INDEPENDENT_AMBULATORY_CARE_PROVIDER_SITE_OTHER): Payer: Self-pay | Admitting: *Deleted

## 2022-12-12 ENCOUNTER — Ambulatory Visit: Payer: Medicaid Other | Admitting: Adult Health

## 2022-12-19 ENCOUNTER — Ambulatory Visit (INDEPENDENT_AMBULATORY_CARE_PROVIDER_SITE_OTHER): Payer: Medicaid Other | Admitting: Adult Health

## 2022-12-19 ENCOUNTER — Encounter: Payer: Self-pay | Admitting: Adult Health

## 2022-12-19 VITALS — BP 136/86 | HR 83 | Ht 61.0 in | Wt 204.0 lb

## 2022-12-19 DIAGNOSIS — Z8742 Personal history of other diseases of the female genital tract: Secondary | ICD-10-CM | POA: Diagnosis not present

## 2022-12-19 DIAGNOSIS — Z975 Presence of (intrauterine) contraceptive device: Secondary | ICD-10-CM

## 2022-12-19 DIAGNOSIS — R1031 Right lower quadrant pain: Secondary | ICD-10-CM

## 2022-12-19 NOTE — Progress Notes (Signed)
  Subjective:     Patient ID: Shannon Lin, female   DOB: 01-27-1991, 32 y.o.   MRN: [id number]  HPI Shannon Lin is a 31 year old white female,single, G0P0, in complaining of pain right lower side. She had CT 10/27/22 that showed 2.5 cm ovarian cyst.  Last pap was negative HPV,NILM 09/12/21  Review of Systems Pain RLQ Occasional pain with sex Reviewed past medical,surgical, social and family history. Reviewed medications and allergies.     Objective:   Physical Exam BP 136/86 (BP Location: Left Arm, Patient Position: Sitting, Cuff Size: Normal)   Pulse 83   Ht 5\' 1"  (1.549 m)   Wt 204 lb (92.5 kg)   BMI 38.55 kg/m     Skin warm and dry.Pelvic: external genitalia is normal in appearance no lesions, pt refused speculum exam but was ok with bi manuel, no CMT , uterus: normal size, shape and contour, non tender, no masses felt, adnexa: no masses, RLQ tenderness noted. Bladder is non tender and no masses felt.  Fall risk is low  Upstream - 12/19/22 1226       Pregnancy Intention Screening   Does the patient want to become pregnant in the next year? No    Does the patient's partner want to become pregnant in the next year? No    Would the patient like to discuss contraceptive options today? No      Contraception Wrap Up   Current Method Hormonal Implant    End Method Hormonal Implant             Examination chaperoned by Levy Pupa LPN  Assessment:     1. RLQ abdominal pain Pain RLQ, hurts lay on right side Will get Pelvic US to assess  - US PELVIC COMPLETE WITH TRANSVAGINAL; Future  2. History of ovarian cyst Had 2.5 cm rt ovary cyst on CT in January 2024 Will get [place] to assess  - US PELVIC COMPLETE WITH TRANSVAGINAL; Future  3. Nexplanon in place Placed 08/13/21    Plan:     Follow up TBD

## 2022-12-30 ENCOUNTER — Ambulatory Visit (INDEPENDENT_AMBULATORY_CARE_PROVIDER_SITE_OTHER): Payer: Medicaid Other | Admitting: General Surgery

## 2022-12-30 ENCOUNTER — Other Ambulatory Visit: Payer: Self-pay

## 2022-12-30 ENCOUNTER — Other Ambulatory Visit: Payer: Self-pay | Admitting: General Surgery

## 2022-12-30 ENCOUNTER — Encounter: Payer: Self-pay | Admitting: General Surgery

## 2022-12-30 VITALS — BP 124/86 | HR 90 | Temp 98.3°F | Resp 20 | Ht 61.0 in | Wt 207.0 lb

## 2022-12-30 DIAGNOSIS — K6289 Other specified diseases of anus and rectum: Secondary | ICD-10-CM

## 2022-12-30 DIAGNOSIS — R1011 Right upper quadrant pain: Secondary | ICD-10-CM | POA: Diagnosis not present

## 2022-12-30 MED ORDER — DICYCLOMINE HCL 10 MG PO CAPS: 10.0000 mg | ORAL_CAPSULE | Freq: Three times a day (TID) | ORAL | 1 refills | Status: DC

## 2022-12-30 NOTE — Patient Instructions (Addendum)
Will get HIDA scan to check your gallbladder function since you have no stones on prior imaging.  Will have you try Bentyl for abdominal cramping pain and diarrhea as this could be related to IBS.  Would continue the compound from [place] for the anal pain for now. Monitor the small nodule/ external hemorrhoid on the right anterior side.   Will call you back with results of the scan and discuss options and how you are doing with the Bentyl.

## 2022-12-31 ENCOUNTER — Ambulatory Visit (HOSPITAL_COMMUNITY): Payer: Medicaid Other

## 2023-01-06 ENCOUNTER — Other Ambulatory Visit: Payer: Self-pay | Admitting: Adult Health

## 2023-01-06 ENCOUNTER — Ambulatory Visit (HOSPITAL_COMMUNITY)
Admission: RE | Admit: 2023-01-06 | Discharge: 2023-01-06 | Disposition: A | Discharge status: Home / Self Care | Payer: Medicaid Other | Source: Ambulatory Visit | Attending: Adult Health | Admitting: Adult Health

## 2023-01-06 ENCOUNTER — Encounter (HOSPITAL_COMMUNITY)
Admission: RE | Admit: 2023-01-06 | Discharge: 2023-01-06 | Disposition: A | Discharge status: Home / Self Care | Payer: Medicaid Other | Source: Ambulatory Visit | Attending: General Surgery | Admitting: General Surgery

## 2023-01-06 DIAGNOSIS — R1011 Right upper quadrant pain: Secondary | ICD-10-CM | POA: Diagnosis present

## 2023-01-06 DIAGNOSIS — R1010 Upper abdominal pain, unspecified: Secondary | ICD-10-CM | POA: Diagnosis not present

## 2023-01-06 DIAGNOSIS — R1031 Right lower quadrant pain: Secondary | ICD-10-CM

## 2023-01-06 DIAGNOSIS — Z8742 Personal history of other diseases of the female genital tract: Secondary | ICD-10-CM | POA: Insufficient documentation

## 2023-01-06 DIAGNOSIS — R110 Nausea: Secondary | ICD-10-CM | POA: Diagnosis not present

## 2023-01-06 DIAGNOSIS — Z975 Presence of (intrauterine) contraceptive device: Secondary | ICD-10-CM

## 2023-01-06 MED ORDER — SINCALIDE 5 MCG IJ SOLR
INTRAMUSCULAR | Status: AC
  Administered 2023-01-06: 1.86 ug
  Filled 2023-01-06: qty 5

## 2023-01-06 MED ORDER — STERILE WATER FOR INJECTION IJ SOLN
INTRAMUSCULAR | Status: AC
  Administered 2023-01-06: 1.86 mL
  Filled 2023-01-06: qty 10

## 2023-01-06 MED ORDER — TECHNETIUM TC 99M MEBROFENIN IV KIT
5.0000 | PACK | Freq: Once | INTRAVENOUS | Status: AC | PRN
  Administered 2023-01-06: 5.5 via INTRAVENOUS

## 2023-01-12 NOTE — Progress Notes (Signed)
Normal HIDA scan. Recommend continuing the bentyl and following up with GI regarding her abdominal pain. She has an appt 5/16.

## 2023-01-20 ENCOUNTER — Encounter: Payer: Self-pay | Admitting: Nurse Practitioner

## 2023-01-20 NOTE — Telephone Encounter (Signed)
Nurses please triage  If LOC with injury, or focal findings or vomiting or ataxia or gets worse then ER Otherwise please arrange follow up after proper triage

## 2023-01-22 ENCOUNTER — Telehealth: Payer: Self-pay

## 2023-01-22 NOTE — Telephone Encounter (Signed)
Elizbeth Squires, CMA      01/22/23 10:29 AM Note Called and spoke with patient she stated she did have a rough fall but she was doing much better today. She stated the day she fell she did not go to the ED and get checked out. She was told that there was no providers in the office the next two days and she would need to go to the ED for vomiting, headache, dizziness or loss of balance (ataxia). She stated she felt fine she did not need the ED. She stated if she felt like she needed an appointment she would schedule an appointment for next week if needed.

## 2023-01-22 NOTE — Telephone Encounter (Signed)
Called and spoke with patient she stated she did have a rough fall but she was doing much better today. She stated the day she fell she did not go to the ED and get checked out. She was told that there was no providers in the office the next two days and she would need to go to the ED for vomiting, headache, dizziness or loss of balance (ataxia). She stated she felt fine she did not need the ED. She stated if she felt like she needed an appointment she would schedule an appointment for next week if needed.

## 2023-01-28 ENCOUNTER — Encounter: Payer: Self-pay | Admitting: Family Medicine

## 2023-02-06 ENCOUNTER — Ambulatory Visit (INDEPENDENT_AMBULATORY_CARE_PROVIDER_SITE_OTHER): Payer: Medicaid Other | Admitting: Family Medicine

## 2023-02-06 ENCOUNTER — Ambulatory Visit: Payer: Medicaid Other | Admitting: Family Medicine

## 2023-02-06 ENCOUNTER — Encounter: Payer: Self-pay | Admitting: Family Medicine

## 2023-02-06 VITALS — BP 119/82 | HR 78 | Temp 98.1°F | Ht 61.0 in | Wt 205.0 lb

## 2023-02-06 DIAGNOSIS — B839 Helminthiasis, unspecified: Secondary | ICD-10-CM | POA: Insufficient documentation

## 2023-02-06 MED ORDER — PYRANTEL PAMOATE 144 (50 BASE) MG/ML PO SUSP: 1000.0000 mg | Freq: Once | ORAL | 0 refills | Status: AC

## 2023-02-06 NOTE — Progress Notes (Signed)
Subjective:  Patient ID: Shannon Lin, female    DOB: Mar 02, 1991  Age: 33 y.o. MRN: [id number]  CC: Chief Complaint  Patient presents with   pinworms    X 2 months - has taken medicine but still going on abd pain, nausea and frequent stools     HPI:  32 year old female resents for evaluation the above.  Patient states that she is having issues with abdominal pain.  She states that she has had worms in her stool.  This has been going on since January.  She was treated with mebendazole.  She states that she had adverse side effects and subsequently was seen in the ER regarding this.  Patient states that she continues to see worms intermittently in her stool.  Continues to have abdominal pain.  Particularly upper abdominal pain.  Has a known history of IBS as well as GERD.  Patient Active Problem List   Diagnosis Date Noted   Worms in stool 02/06/2023   History of ovarian cyst 12/19/2022   Grade III hemorrhoids 06/12/2022   Bipolar 1 disorder (HCC) 02/04/2021   Internal thrombosed hemorrhoids 09/18/2020   Migraine without aura and without status migrainosus, not intractable 05/03/2020   GERD (gastroesophageal reflux disease) 11/17/2019   Prediabetes 10/13/2019   Tubular adenoma of colon 08/11/2019   Anal fissure 07/05/2019   Nexplanon insertion 08/12/2018   Hyperinsulinemia 10/14/2016   Morbid obesity (HCC) 02/04/2013    Social Hx   Social History   Socioeconomic History   Marital status: Single    Spouse name: Not on file   Number of children: Not on file   Years of education: Not on file   Highest education level: 12th grade  Occupational History   Not on file  Tobacco Use   Smoking status: Never   Smokeless tobacco: Never  Vaping Use   Vaping Use: Never used  Substance and Sexual Activity   Alcohol use: No   Drug use: No   Sexual activity: Yes    Birth control/protection: Implant  Other Topics Concern   Not on file  Social History Narrative   Not on  file   Social Determinants of Health   Financial Resource Strain: Low Risk  (02/03/2023)   Overall Financial Resource Strain (CARDIA)    Difficulty of Paying Living Expenses: Not hard at all  Food Insecurity: Patient Declined (02/03/2023)   Hunger Vital Sign    Worried About Running Out of Food in the Last Year: Patient declined    Ran Out of Food in the Last Year: Patient declined  Transportation Needs: No Transportation Needs (02/03/2023)   PRAPARE - Administrator, Civil Service (Medical): No    Lack of Transportation (Non-Medical): No  Physical Activity: Insufficiently Active (02/03/2023)   Exercise Vital Sign    Days of Exercise per Week: 2 days    Minutes of Exercise per Session: 20 min  Stress: Stress Concern Present (02/03/2023)   [employer] of Occupational Health - Occupational Stress Questionnaire    Feeling of Stress : Rather much  Social Connections: Moderately Isolated (02/03/2023)   Social Connection and Isolation Panel [NHANES]    Frequency of Communication with Friends and Family: More than three times a week    Frequency of Social Gatherings with Friends and Family: Once a week    Attends Religious Services: 1 to 4 times per year    Active Member of [employer] or Organizations: No    Attends Banker  Meetings: Not on file    Marital Status: Never married    Review of Systems Per HPI  Objective:  BP 119/82   Pulse 78   Temp 98.1 F (36.7 C)   Ht 5\' 1"  (1.549 m)   Wt 205 lb (93 kg)   SpO2 97%   BMI 38.73 kg/m      02/06/2023    1:09 PM 12/30/2022   10:51 AM 12/19/2022   12:46 PM  BP/Weight  Systolic BP 119 124 136  Diastolic BP 82 86 86  Wt. (Lbs) 205 207   BMI 38.73 kg/m2 39.11 kg/m2     Physical Exam Vitals and nursing note reviewed.  Constitutional:      General: She is not in acute distress.    Appearance: Normal appearance.  HENT:     Head: Normocephalic and atraumatic.  Cardiovascular:     Rate and Rhythm: Normal  rate and regular rhythm.  Pulmonary:     Effort: Pulmonary effort is normal.     Breath sounds: Normal breath sounds. No wheezing or rales.  Abdominal:     General: There is no distension.     Palpations: Abdomen is soft.     Comments: Mild epigastric tenderness to palpation.  Neurological:     Mental Status: She is alert.     Lab Results  Component Value Date   WBC 8.8 10/27/2022   HGB 13.6 10/27/2022   HCT 40.9 10/27/2022   PLT 315 10/27/2022   GLUCOSE 139 (H) 10/27/2022   CHOL 164 03/31/2022   TRIG 166 (H) 03/31/2022   HDL 38 (L) 03/31/2022   LDLCALC 97 03/31/2022   ALT 30 10/27/2022   AST 24 10/27/2022   NA 135 10/27/2022   K 4.0 10/27/2022   CL 99 10/27/2022   CREATININE 0.64 10/27/2022   BUN 9 10/27/2022   CO2 27 10/27/2022   TSH 3.360 07/04/2021   HGBA1C 7.3 (H) 03/31/2022     Assessment & Plan:   Problem List Items Addressed This Visit       Other   Worms in stool - Primary    Treating with pyrantel pamoate.  Sending for stool O & P.      Relevant Orders   Ova and Parasite Examination    Meds ordered this encounter  Medications   pyrantel pamoate 50 MG/ML SUSP    Sig: Take 20 mLs (1,000 mg total) by mouth once for 1 dose. Repeat dose in 2 weeks.    Dispense:  30 mL    Refill:  0   Nick Stults DO [facility]

## 2023-02-06 NOTE — Patient Instructions (Signed)
Medication as directed.  Go to [facility] to get tube needed for sample.  We will call with results.

## 2023-02-06 NOTE — Assessment & Plan Note (Signed)
Treating with pyrantel pamoate.  Sending for stool O & P.

## 2023-02-10 ENCOUNTER — Ambulatory Visit (INDEPENDENT_AMBULATORY_CARE_PROVIDER_SITE_OTHER): Payer: Medicaid Other | Admitting: Adult Health

## 2023-02-10 ENCOUNTER — Encounter: Payer: Self-pay | Admitting: Adult Health

## 2023-02-10 VITALS — BP 117/79 | HR 96 | Ht 61.0 in | Wt 206.5 lb

## 2023-02-10 DIAGNOSIS — Z23 Encounter for immunization: Secondary | ICD-10-CM | POA: Insufficient documentation

## 2023-02-10 DIAGNOSIS — Z7185 Encounter for immunization safety counseling: Secondary | ICD-10-CM | POA: Diagnosis not present

## 2023-02-10 NOTE — Progress Notes (Signed)
  Subjective:     Patient ID: Shannon Lin, female   DOB: 06/25/1991, 32 y.o.   MRN: [id number]  HPI Shannon Lin is a 32 year old white female, single, G0P0, in requesting Gardasil. Counseled that it may not be covered by her insurance, and she may get a bill, and she still wants to get it, says it will ease her mind from worrying as much.  Last pap was negative HPV and NILM 10/02/21.  PCP is Dr Lilyan Punt.   Review of Systems For gardasil injection #1 Reviewed past medical,surgical, social and family history. Reviewed medications and allergies.     Objective:   Physical Exam BP 117/79 (BP Location: Left Arm, Patient Position: Sitting, Cuff Size: Normal)   Pulse 96   Ht 5\' 1"  (1.549 m)   Wt 206 lb 8 oz (93.7 kg)   BMI 39.02 kg/m  Skin warm and dry.  Lungs: clear to ausculation bilaterally. Cardiovascular: regular rate and rhythm.      Upstream - 02/10/23 1632       Pregnancy Intention Screening   Does the patient want to become pregnant in the next year? Yes    Does the patient's partner want to become pregnant in the next year? Yes    Would the patient like to discuss contraceptive options today? No      Contraception Wrap Up   Current Method Hormonal Implant    End Method Hormonal Implant             Assessment:     1. HPV vaccine counseling  2. Vaccine for human papilloma virus (HPV) types 6, 11, 16, and 18 administered First injection given by Malachy Mood LPN     Plan:     Return in 2 months for second Garadasil and 6 months for third injection

## 2023-02-19 ENCOUNTER — Encounter (INDEPENDENT_AMBULATORY_CARE_PROVIDER_SITE_OTHER): Payer: Medicaid Other | Admitting: Gastroenterology

## 2023-02-24 ENCOUNTER — Telehealth: Payer: Self-pay | Admitting: *Deleted

## 2023-02-24 NOTE — Telephone Encounter (Signed)
Received call from patient [phone]~ telephone.   Reports that she has new hemorrhoid that is causing her pain. Unable to determine when hemorrhoid formed, but patient states that it was after prior surgery.   States that she is in pain with hemorrhoid, but is not currently using any OTC medications for hemorrhoids such as wipes, pads, or creams.   States that she has bright red blood noted after wiping. Patient reports that she is having BM's. States that she has not strained to have BM, and BM noted soft.   Advised to use OTC methods to attempt to ease pain. Advised to use sitz baths to reduce inflammation.   Attempted to schedule appointment with Dr. Henreitta Leber for evaluation, but patient declined at this time.   Advised if pain is noted severe, patient should follow up with PCP or GI.

## 2023-03-20 ENCOUNTER — Encounter: Payer: Self-pay | Admitting: Nurse Practitioner

## 2023-03-29 ENCOUNTER — Encounter: Payer: Self-pay | Admitting: Nurse Practitioner

## 2023-03-30 ENCOUNTER — Other Ambulatory Visit: Payer: Self-pay | Admitting: Nurse Practitioner

## 2023-03-30 DIAGNOSIS — N62 Hypertrophy of breast: Secondary | ICD-10-CM

## 2023-03-31 ENCOUNTER — Other Ambulatory Visit: Payer: Self-pay | Admitting: Nurse Practitioner

## 2023-03-31 DIAGNOSIS — N62 Hypertrophy of breast: Secondary | ICD-10-CM

## 2023-04-01 ENCOUNTER — Other Ambulatory Visit: Payer: Self-pay | Admitting: Nurse Practitioner

## 2023-04-01 DIAGNOSIS — N62 Hypertrophy of breast: Secondary | ICD-10-CM

## 2023-04-08 ENCOUNTER — Ambulatory Visit (INDEPENDENT_AMBULATORY_CARE_PROVIDER_SITE_OTHER): Payer: MEDICAID | Admitting: *Deleted

## 2023-04-08 DIAGNOSIS — Z23 Encounter for immunization: Secondary | ICD-10-CM

## 2023-04-08 NOTE — Progress Notes (Signed)
   NURSE VISIT- INJECTION  SUBJECTIVE:  Shannon Lin is a 32 y.o. G0P0000 female here for a Gardasil 9 for per provider order. She is a GYN patient.   OBJECTIVE:  There were no vitals taken for this visit.  Appears well, in no apparent distress  Injection administered in: Left deltoid  No orders of the defined types were placed in this encounter.   ASSESSMENT: GYN patient Gardasil for hpv prevention PLAN: Follow-up:  in 4 months    Annamarie Dawley  04/08/2023 9:44 AM

## 2023-04-10 ENCOUNTER — Ambulatory Visit: Payer: Medicaid Other

## 2023-05-18 ENCOUNTER — Other Ambulatory Visit: Payer: Self-pay | Admitting: Nurse Practitioner

## 2023-05-18 DIAGNOSIS — N62 Hypertrophy of breast: Secondary | ICD-10-CM

## 2023-05-25 ENCOUNTER — Encounter: Payer: Self-pay | Admitting: Family Medicine

## 2023-06-19 ENCOUNTER — Encounter: Payer: Self-pay | Admitting: Nurse Practitioner

## 2023-06-26 ENCOUNTER — Institutional Professional Consult (permissible substitution): Payer: Medicaid Other | Admitting: Plastic Surgery

## 2023-07-16 ENCOUNTER — Encounter: Payer: Self-pay | Admitting: Plastic Surgery

## 2023-07-16 ENCOUNTER — Ambulatory Visit (INDEPENDENT_AMBULATORY_CARE_PROVIDER_SITE_OTHER): Payer: MEDICAID | Admitting: Plastic Surgery

## 2023-07-16 DIAGNOSIS — N62 Hypertrophy of breast: Secondary | ICD-10-CM | POA: Diagnosis not present

## 2023-07-16 DIAGNOSIS — Z6841 Body Mass Index (BMI) 40.0 and over, adult: Secondary | ICD-10-CM | POA: Diagnosis not present

## 2023-07-16 DIAGNOSIS — M546 Pain in thoracic spine: Secondary | ICD-10-CM

## 2023-07-16 DIAGNOSIS — M542 Cervicalgia: Secondary | ICD-10-CM | POA: Diagnosis not present
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
## 2023-07-17 ENCOUNTER — Encounter: Payer: Self-pay | Admitting: Plastic Surgery

## 2023-07-25 ENCOUNTER — Encounter: Payer: Self-pay | Admitting: Family Medicine

## 2023-07-28 ENCOUNTER — Encounter: Payer: Self-pay | Admitting: Plastic Surgery

## 2023-07-30 ENCOUNTER — Telehealth: Payer: Self-pay | Admitting: Plastic Surgery

## 2023-07-30 NOTE — Telephone Encounter (Signed)
Patient called stating she was returning a call back from you, I told her I send a message and that you'd call back

## 2023-08-13 ENCOUNTER — Ambulatory Visit (INDEPENDENT_AMBULATORY_CARE_PROVIDER_SITE_OTHER): Payer: MEDICAID

## 2023-08-13 DIAGNOSIS — Z23 Encounter for immunization: Secondary | ICD-10-CM | POA: Diagnosis not present

## 2023-08-13 NOTE — Progress Notes (Signed)
   NURSE VISIT- INJECTION  SUBJECTIVE:  Shannon Lin is a 32 y.o. G0P0000 female here for a Gardasil for HPV vaccination.  She is a GYN patient.   OBJECTIVE:  There were no vitals taken for this visit.  Appears well, in no apparent distress  Injection administered in: Left deltoid  HPV 9-Valent given   ASSESSMENT: GYN patient Gardasil for HPV vaccination. Third in series. PLAN: Follow-up: as needed   Caralyn Guile  08/13/2023 11:57 AM

## 2023-08-14 ENCOUNTER — Ambulatory Visit (INDEPENDENT_AMBULATORY_CARE_PROVIDER_SITE_OTHER): Payer: MEDICAID | Admitting: Nurse Practitioner

## 2023-08-14 VITALS — BP 116/74 | HR 95 | Temp 97.9°F | Ht 61.0 in | Wt 207.8 lb

## 2023-08-14 DIAGNOSIS — J300 Vasomotor rhinitis: Secondary | ICD-10-CM

## 2023-08-14 MED ORDER — FLUTICASONE PROPIONATE 50 MCG/ACT NA SUSP: 2.0000 | Freq: Every day | NASAL | 5 refills | Status: DC

## 2023-08-15 ENCOUNTER — Encounter: Payer: Self-pay | Admitting: Nurse Practitioner

## 2023-08-15 DIAGNOSIS — J300 Vasomotor rhinitis: Secondary | ICD-10-CM | POA: Insufficient documentation

## 2023-08-15 NOTE — Progress Notes (Signed)
   Subjective:    Patient ID: Shannon Lin, female    DOB: 1990/11/24, 32 y.o.   MRN: [id number]  HPI Presents with her boyfriend for complaints of slight dizziness at nighttime for the past 2 weeks.  Describes a spinning sensation.  Only occurs in the evening and nighttime.  Starts with generalized pressure across her forehead which is relieved with her boyfriend putting pressure with his hand and then a cold compress.  Very brief in nature.  No visual changes.  No nausea or vomiting.  No neurologic symptoms.  No syncope. Patient is going to have breast reduction surgery in the near future.   Review of Systems  Constitutional:  Negative for fever.  HENT:  Positive for congestion, postnasal drip and sinus pressure. Negative for ear pain and sore throat.   Respiratory:  Negative for cough, chest tightness, shortness of breath and wheezing.   Cardiovascular:  Negative for chest pain.  Neurological:  Positive for dizziness. Negative for syncope, facial asymmetry, speech difficulty, weakness, numbness and headaches.       Objective:   Physical Exam NAD.  Alert, oriented.  Calm cheerful affect.  Pupils equal and reactive to light.  EOMs intact without nystagmus.  TMs significant retraction bilaterally, no erythema.  Pharynx nonerythematous with cloudy PND noted.  Neck supple with mild soft anterior adenopathy.  Lungs clear.  Heart regular rate rhythm.  Reflexes normal upper and lower extremities.  Gait normal.  Romberg negative. Today's Vitals   08/14/23 1451  BP: 116/74  Pulse: 95  Temp: 97.9 F (36.6 C)  SpO2: 97%  Weight: 207 lb 12.8 oz (94.3 kg)  Height: 5\' 1"  (1.549 m)   Body mass index is 39.26 kg/m.        Assessment & Plan:   Problem List Items Addressed This Visit       Respiratory   Vasomotor rhinitis - Primary   Meds ordered this encounter  Medications   fluticasone (FLONASE) 50 MCG/ACT nasal spray    Sig: Place 2 sprays into both nostrils daily. For  congestion    Dispense:  16 g    Refill:  5    Order Specific Question:   Supervising Provider    Answer:   Lilyan Punt A [[id number]]   Mild brief pressure and dizziness most likely caused by sinus congestion.  No indication for antibiotics at this time.  Start Flonase as directed.  Also patient has meclizine on hand if she needs it for severe dizziness.  Call back in 7 to 10 days if no improvement, sooner if worse.  Warning signs reviewed.

## 2023-08-18 ENCOUNTER — Encounter: Payer: Self-pay | Admitting: Nurse Practitioner

## 2023-08-19 ENCOUNTER — Other Ambulatory Visit: Payer: Self-pay | Admitting: Nurse Practitioner

## 2023-08-19 MED ORDER — AMOXICILLIN-POT CLAVULANATE 875-125 MG PO TABS: 1.0000 | ORAL_TABLET | Freq: Two times a day (BID) | ORAL | 0 refills | Status: DC

## 2023-09-01 ENCOUNTER — Ambulatory Visit: Payer: MEDICAID | Admitting: General Surgery

## 2023-09-01 ENCOUNTER — Encounter: Payer: Self-pay | Admitting: General Surgery

## 2023-09-01 VITALS — BP 113/80 | HR 76 | Temp 98.1°F | Resp 16 | Ht 61.0 in | Wt 213.0 lb

## 2023-09-01 DIAGNOSIS — K641 Second degree hemorrhoids: Secondary | ICD-10-CM | POA: Diagnosis not present

## 2023-09-01 DIAGNOSIS — R109 Unspecified abdominal pain: Secondary | ICD-10-CM | POA: Diagnosis not present

## 2023-09-01 MED ORDER — DICYCLOMINE HCL 10 MG PO CAPS: 10.0000 mg | ORAL_CAPSULE | Freq: Three times a day (TID) | ORAL | 1 refills | Status: DC

## 2023-09-01 NOTE — Patient Instructions (Addendum)
Would not do anything with the hemorrhoid.  Recommend drinking 100 ounces of water a day (you weigh 96.6 kg).  Recommend a stool softener if you are going to eat a lot of cheese or benefiber/ metamucil is also an option for when you eat cheese. Can try bentyl for the abdominal pain that may be related to IBS. Refer back to GI to see if they have any thoughts.  They also can discuss any follow up you need for your pinworms.

## 2023-09-02 NOTE — Progress Notes (Signed)
[facility] Note   HPI:  32 y.o. Female presents to clinic for follow-up evaluation of her hemorrhoids. She has had some issue with pain when she gets constipated after eating cheese. She had a hemorrhoidectomy 06/2022. She says that this occurs like 2 times a month but is mostly with eating cheese. She has abdominal pain at times and says that she has not been seen by GI since Dr. Karilyn Cota retired. She says that she possibly was told she had IBS in the past. She says that when she has her hemorrhoid flare she has abdominal pain. She reports that otherwise she is mostly regular and has Bms daily. She drinks coffee and has BM. She says that she did have pinworms in the recent months that was treated. She is not sure if it was verified to be resolved.   Review of Systems:  Abdominal pain Rectal pain with constipation  All other review of systems: otherwise negative   Vital Signs:  BP 113/80   Pulse 76   Temp 98.1 F (36.7 C) (Oral)   Resp 16   Ht 5\' 1"  (1.549 m)   Wt 213 lb (96.6 kg)   SpO2 97%   BMI 40.25 kg/m    Physical Exam:  Physical Exam HENT:     Head: Normocephalic.  Eyes:     Pupils: Pupils are equal, round, and reactive to light.  Cardiovascular:     Rate and Rhythm: Normal rate.  Pulmonary:     Effort: Pulmonary effort is normal.  Abdominal:     General: There is no distension.     Palpations: Abdomen is soft.  Genitourinary:    Comments: Right posterior column, small, no external tag but grade II hemorrhoid  Musculoskeletal:        General: No swelling.  Skin:    General: Skin is warm.  Neurological:     General: No focal deficit present.     Mental Status: She is alert.  Psychiatric:        Mood and Affect: Mood normal.     Assessment:  32 y.o. yo Female with a small right posterior hemorrhoid. This is only hurting with eating cheese and getting constipated. She says she drinks < 12 ounces of water a day and otherwise drinks soda. She has  this vague abdominal pain. I think she needs to get reestablished with GI but I do not think she needs to get hemorrhoidectomy right now. This area is minimal.   Plan:  Would not do anything with the hemorrhoid.  Recommend drinking 100 ounces of water a day (you weigh 96.6 kg).  Recommend a stool softener if you are going to eat a lot of cheese or benefiber/ metamucil is also an option for when you eat cheese. Can try bentyl for the abdominal pain that may be related to IBS. Refer back to GI to see if they have any thoughts.  They also can discuss any follow up you need for your pinworms.   All of the above recommendations were discussed with the patient and patient's family, and all of patient's and family's questions were answered to their expressed satisfaction. Greater than 50% of the 40 minute visit was spent in counseling/ coordination of care regarding her symptoms and the next steps.    Algis Greenhouse, MD [facility] [address] [place], [place] [address] [phone] (office)

## 2023-09-04 ENCOUNTER — Encounter: Payer: Self-pay | Admitting: Family Medicine

## 2023-09-07 ENCOUNTER — Other Ambulatory Visit: Payer: Self-pay | Admitting: Nurse Practitioner

## 2023-09-07 ENCOUNTER — Encounter (INDEPENDENT_AMBULATORY_CARE_PROVIDER_SITE_OTHER): Payer: Self-pay | Admitting: *Deleted

## 2023-09-07 ENCOUNTER — Telehealth: Payer: Self-pay

## 2023-09-07 MED ORDER — METFORMIN HCL 500 MG PO TABS: ORAL_TABLET | ORAL | 0 refills | Status: DC

## 2023-09-07 NOTE — Telephone Encounter (Signed)
Left message for patient to scheduled follow up office visit with [facility]

## 2023-09-07 NOTE — Telephone Encounter (Signed)
Nurses I read her message Certainly sorry she is having such trouble It would be wise to do a follow-up visit for assessment of the headache and easy bruising with myself or Toni Amend Grooms our PA in the near future thank you

## 2023-09-07 NOTE — Telephone Encounter (Signed)
Prescription Request  09/07/2023  LOV: Visit date not found  What is the name of the medication or equipment? metFORMIN (GLUCOPHAGE) 500 MG tablet   Have you contacted your pharmacy to request a refill? Yes   Which pharmacy would you like this sent to?  [id number] - [place], [place] - [address] [place] [address] Phone: [phone] Fax: [phone]    Patient notified that their request is being sent to the clinical staff for review and that they should receive a response within 2 business days.   Please advise at Mobile [phone] (mobile)

## 2023-09-08 ENCOUNTER — Encounter (INDEPENDENT_AMBULATORY_CARE_PROVIDER_SITE_OTHER): Payer: Self-pay | Admitting: Gastroenterology

## 2023-09-08 ENCOUNTER — Ambulatory Visit (INDEPENDENT_AMBULATORY_CARE_PROVIDER_SITE_OTHER): Payer: MEDICAID | Admitting: Gastroenterology

## 2023-09-08 VITALS — BP 136/87 | HR 101 | Temp 98.5°F | Ht 61.0 in | Wt 213.2 lb

## 2023-09-08 DIAGNOSIS — K5900 Constipation, unspecified: Secondary | ICD-10-CM | POA: Diagnosis not present

## 2023-09-08 DIAGNOSIS — K581 Irritable bowel syndrome with constipation: Secondary | ICD-10-CM | POA: Insufficient documentation

## 2023-09-08 DIAGNOSIS — R1010 Upper abdominal pain, unspecified: Secondary | ICD-10-CM
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
## 2023-09-08 NOTE — Patient Instructions (Addendum)
Increase water intake, aim for atleast 64 oz per day Increase fruits, veggies and whole grains, kiwi and prunes are especially good for constipation Continue with dicyclomine 10mg , up to three times per day, be mindful this can increase constipation in higher doses We will start with benefiber 1T twice daily with a meal, can increase to 1T three times daily if tolerating, as discussed, you must drink plenty of water for this to be beneficial If benefiber alone is not working, would recommend adding 1 Capful miralax daily We will check thyroid function to ensure this is not contributing to your constipation  Follow up 3 months  It was a pleasure to see you today. I want to create trusting relationships with patients and provide genuine, compassionate, and quality care. I truly value your feedback! please be on the lookout for a survey regarding your visit with me today. I appreciate your input about our visit and your time in completing this!    Jeronda Don L. Jeanmarie Hubert, MSN, APRN, AGNP-C Adult-Gerontology Nurse Practitioner [facility]

## 2023-09-09 LAB — TSH: TSH: 3.75 m[IU]/L
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
## 2023-09-22 ENCOUNTER — Encounter: Payer: Self-pay | Admitting: Physician Assistant

## 2023-09-22 ENCOUNTER — Ambulatory Visit: Payer: MEDICAID | Admitting: Physician Assistant

## 2023-09-22 VITALS — BP 116/75 | HR 78 | Ht 61.0 in | Wt 212.8 lb

## 2023-09-22 DIAGNOSIS — N62 Hypertrophy of breast: Secondary | ICD-10-CM

## 2023-09-22 MED ORDER — ONDANSETRON 4 MG PO TBDP: 4.0000 mg | ORAL_TABLET | Freq: Three times a day (TID) | ORAL | 0 refills | Status: DC | PRN

## 2023-09-22 MED ORDER — OXYCODONE HCL 5 MG PO TABS: 5.0000 mg | ORAL_TABLET | Freq: Three times a day (TID) | ORAL | 0 refills | Status: AC | PRN

## 2023-10-11 ENCOUNTER — Encounter (INDEPENDENT_AMBULATORY_CARE_PROVIDER_SITE_OTHER): Payer: Self-pay

## 2023-10-18 DIAGNOSIS — H5213 Myopia, bilateral: Secondary | ICD-10-CM | POA: Diagnosis not present

## 2023-10-19 ENCOUNTER — Encounter: Payer: Self-pay | Admitting: Family Medicine

## 2023-10-19 ENCOUNTER — Encounter (HOSPITAL_BASED_OUTPATIENT_CLINIC_OR_DEPARTMENT_OTHER)
Admission: RE | Admit: 2023-10-19 | Discharge: 2023-10-19 | Disposition: A | Discharge status: Home / Self Care | Payer: MEDICAID | Source: Ambulatory Visit | Attending: Plastic Surgery | Admitting: Plastic Surgery

## 2023-10-19 ENCOUNTER — Encounter: Payer: Self-pay | Admitting: Plastic Surgery

## 2023-10-19 DIAGNOSIS — K219 Gastro-esophageal reflux disease without esophagitis: Secondary | ICD-10-CM | POA: Diagnosis not present

## 2023-10-19 DIAGNOSIS — N62 Hypertrophy of breast: Secondary | ICD-10-CM | POA: Diagnosis not present

## 2023-10-19 DIAGNOSIS — J45909 Unspecified asthma, uncomplicated: Secondary | ICD-10-CM | POA: Diagnosis not present

## 2023-10-19 DIAGNOSIS — M542 Cervicalgia: Secondary | ICD-10-CM | POA: Diagnosis not present

## 2023-10-19 DIAGNOSIS — Z01812 Encounter for preprocedural laboratory examination: Secondary | ICD-10-CM | POA: Insufficient documentation

## 2023-10-19 DIAGNOSIS — F319 Bipolar disorder, unspecified: Secondary | ICD-10-CM | POA: Diagnosis not present

## 2023-10-19 DIAGNOSIS — N6032 Fibrosclerosis of left breast: Secondary | ICD-10-CM | POA: Diagnosis not present

## 2023-10-19 DIAGNOSIS — Z6841 Body Mass Index (BMI) 40.0 and over, adult: Secondary | ICD-10-CM | POA: Diagnosis not present

## 2023-10-19 DIAGNOSIS — Q211 Atrial septal defect, unspecified: Secondary | ICD-10-CM | POA: Diagnosis not present

## 2023-10-19 DIAGNOSIS — E66813 Obesity, class 3: Secondary | ICD-10-CM | POA: Diagnosis not present

## 2023-10-19 DIAGNOSIS — Z79899 Other long term (current) drug therapy: Secondary | ICD-10-CM | POA: Diagnosis not present

## 2023-10-19 DIAGNOSIS — G40A09 Absence epileptic syndrome, not intractable, without status epilepticus: Secondary | ICD-10-CM | POA: Diagnosis not present

## 2023-10-19 DIAGNOSIS — N6031 Fibrosclerosis of right breast: Secondary | ICD-10-CM | POA: Diagnosis not present

## 2023-10-19 DIAGNOSIS — L918 Other hypertrophic disorders of the skin: Secondary | ICD-10-CM | POA: Diagnosis not present

## 2023-10-19 DIAGNOSIS — M5489 Other dorsalgia: Secondary | ICD-10-CM | POA: Diagnosis not present

## 2023-10-19 LAB — BASIC METABOLIC PANEL
Anion gap: 8 (ref 5–15)
BUN: 6 mg/dL (ref 6–20)
CO2: 27 mmol/L (ref 22–32)
Calcium: 9.3 mg/dL (ref 8.9–10.3)
Chloride: 103 mmol/L (ref 98–111)
Creatinine, Ser: 0.48 mg/dL (ref 0.44–1.00)
GFR, Estimated: >60 mL/min (ref >60)
Glucose, Bld: 163 mg/dL — ABNORMAL HIGH (ref 70–99)
Potassium: 4.2 mmol/L (ref 3.5–5.1)
Sodium: 138 mmol/L (ref 135–145)

## 2023-10-19 MED ORDER — CHLORHEXIDINE GLUCONATE CLOTH 2 % EX PADS: 6.0000 | MEDICATED_PAD | Freq: Once | CUTANEOUS | Status: DC

## 2023-10-19 NOTE — Progress Notes (Signed)

## 2023-10-20 ENCOUNTER — Other Ambulatory Visit: Payer: Self-pay

## 2023-10-20 ENCOUNTER — Ambulatory Visit (HOSPITAL_BASED_OUTPATIENT_CLINIC_OR_DEPARTMENT_OTHER)
Admission: RE | Admit: 2023-10-20 | Discharge: 2023-10-20 | Disposition: A | Discharge status: Home / Self Care | Payer: MEDICAID | Attending: Plastic Surgery | Admitting: Plastic Surgery

## 2023-10-20 ENCOUNTER — Encounter (HOSPITAL_BASED_OUTPATIENT_CLINIC_OR_DEPARTMENT_OTHER): Payer: Self-pay | Admitting: Plastic Surgery

## 2023-10-20 ENCOUNTER — Ambulatory Visit (HOSPITAL_BASED_OUTPATIENT_CLINIC_OR_DEPARTMENT_OTHER): Payer: MEDICAID | Admitting: Anesthesiology

## 2023-10-20 ENCOUNTER — Encounter (HOSPITAL_BASED_OUTPATIENT_CLINIC_OR_DEPARTMENT_OTHER)
Admission: RE | Disposition: A | Discharge status: Home / Self Care | Payer: Self-pay | Source: Home / Self Care | Attending: Plastic Surgery

## 2023-10-20 DIAGNOSIS — N6031 Fibrosclerosis of right breast: Secondary | ICD-10-CM | POA: Insufficient documentation

## 2023-10-20 DIAGNOSIS — E66813 Obesity, class 3: Secondary | ICD-10-CM | POA: Insufficient documentation

## 2023-10-20 DIAGNOSIS — M542 Cervicalgia: Secondary | ICD-10-CM | POA: Diagnosis not present

## 2023-10-20 DIAGNOSIS — N62 Hypertrophy of breast: Secondary | ICD-10-CM

## 2023-10-20 DIAGNOSIS — J45909 Unspecified asthma, uncomplicated: Secondary | ICD-10-CM | POA: Insufficient documentation

## 2023-10-20 DIAGNOSIS — Q211 Atrial septal defect, unspecified: Secondary | ICD-10-CM | POA: Diagnosis not present

## 2023-10-20 DIAGNOSIS — L918 Other hypertrophic disorders of the skin: Secondary | ICD-10-CM

## 2023-10-20 DIAGNOSIS — M5489 Other dorsalgia: Secondary | ICD-10-CM | POA: Diagnosis not present

## 2023-10-20 DIAGNOSIS — F319 Bipolar disorder, unspecified: Secondary | ICD-10-CM | POA: Diagnosis not present

## 2023-10-20 DIAGNOSIS — G40A09 Absence epileptic syndrome, not intractable, without status epilepticus: Secondary | ICD-10-CM | POA: Insufficient documentation

## 2023-10-20 DIAGNOSIS — K219 Gastro-esophageal reflux disease without esophagitis: Secondary | ICD-10-CM | POA: Insufficient documentation

## 2023-10-20 DIAGNOSIS — Z79899 Other long term (current) drug therapy: Secondary | ICD-10-CM | POA: Insufficient documentation

## 2023-10-20 DIAGNOSIS — N6032 Fibrosclerosis of left breast: Secondary | ICD-10-CM | POA: Insufficient documentation

## 2023-10-20 DIAGNOSIS — Z6841 Body Mass Index (BMI) 40.0 and over, adult: Secondary | ICD-10-CM | POA: Diagnosis not present

## 2023-10-20 DIAGNOSIS — Z01818 Encounter for other preprocedural examination: Secondary | ICD-10-CM

## 2023-10-20 HISTORY — PX: EXCISION OF SKIN TAG: SHX6270

## 2023-10-20 HISTORY — PX: BREAST REDUCTION SURGERY: SHX8

## 2023-10-20 LAB — GLUCOSE, CAPILLARY
Glucose-Capillary: 142 mg/dL — ABNORMAL HIGH (ref 70–99)
Glucose-Capillary: 175 mg/dL — ABNORMAL HIGH (ref 70–99)

## 2023-10-20 LAB — POCT PREGNANCY, URINE: Preg Test, Ur: NEGATIVE

## 2023-10-20 SURGERY — MAMMOPLASTY, REDUCTION
Anesthesia: General | Site: Chest | Laterality: Bilateral

## 2023-10-20 MED ORDER — ONDANSETRON HCL 4 MG/2ML IJ SOLN
INTRAMUSCULAR | Status: AC
  Filled 2023-10-20: qty 2

## 2023-10-20 MED ORDER — LACTATED RINGERS IV SOLN: INTRAVENOUS | Status: DC

## 2023-10-20 MED ORDER — SCOPOLAMINE 1 MG/3DAYS TD PT72
MEDICATED_PATCH | TRANSDERMAL | Status: DC | PRN
  Administered 2023-10-20: 1 via TRANSDERMAL

## 2023-10-20 MED ORDER — SODIUM CHLORIDE (PF) 0.9 % IJ SOLN
INTRAMUSCULAR | Status: DC | PRN
  Administered 2023-10-20: 100 mL

## 2023-10-20 MED ORDER — PHENYLEPHRINE HCL (PRESSORS) 10 MG/ML IV SOLN
INTRAVENOUS | Status: DC | PRN
  Administered 2023-10-20 (×3): 160 ug via INTRAVENOUS

## 2023-10-20 MED ORDER — MIDAZOLAM HCL 2 MG/2ML IJ SOLN
INTRAMUSCULAR | Status: AC
  Filled 2023-10-20: qty 2

## 2023-10-20 MED ORDER — SODIUM CHLORIDE (PF) 0.9 % IJ SOLN
INTRAMUSCULAR | Status: AC
  Filled 2023-10-20: qty 100

## 2023-10-20 MED ORDER — HYDROMORPHONE HCL 1 MG/ML IJ SOLN
INTRAMUSCULAR | Status: DC | PRN
  Administered 2023-10-20: .5 mg via INTRAVENOUS

## 2023-10-20 MED ORDER — DEXAMETHASONE SODIUM PHOSPHATE 4 MG/ML IJ SOLN
INTRAMUSCULAR | Status: DC | PRN
  Administered 2023-10-20: 5 mg via INTRAVENOUS

## 2023-10-20 MED ORDER — ONDANSETRON HCL 4 MG/2ML IJ SOLN: 4.0000 mg | Freq: Four times a day (QID) | INTRAMUSCULAR | Status: DC | PRN

## 2023-10-20 MED ORDER — FENTANYL CITRATE (PF) 100 MCG/2ML IJ SOLN
INTRAMUSCULAR | Status: AC
  Filled 2023-10-20: qty 2

## 2023-10-20 MED ORDER — BUPIVACAINE LIPOSOME 1.3 % IJ SUSP
INTRAMUSCULAR | Status: AC
  Filled 2023-10-20: qty 20

## 2023-10-20 MED ORDER — FENTANYL CITRATE (PF) 100 MCG/2ML IJ SOLN
25.0000 ug | INTRAMUSCULAR | Status: DC | PRN
Start: 2023-10-20 — End: 2023-10-20

## 2023-10-20 MED ORDER — FENTANYL CITRATE (PF) 100 MCG/2ML IJ SOLN
INTRAMUSCULAR | Status: DC | PRN
  Administered 2023-10-20: 50 ug via INTRAVENOUS
  Administered 2023-10-20: 100 ug via INTRAVENOUS

## 2023-10-20 MED ORDER — ONDANSETRON HCL 4 MG/2ML IJ SOLN
INTRAMUSCULAR | Status: DC | PRN
  Administered 2023-10-20: 4 mg via INTRAVENOUS

## 2023-10-20 MED ORDER — ROCURONIUM BROMIDE 100 MG/10ML IV SOLN
INTRAVENOUS | Status: DC | PRN
  Administered 2023-10-20: 50 mg via INTRAVENOUS

## 2023-10-20 MED ORDER — CEFAZOLIN SODIUM-DEXTROSE 2-4 GM/100ML-% IV SOLN
INTRAVENOUS | Status: AC
  Filled 2023-10-20: qty 100

## 2023-10-20 MED ORDER — CEFAZOLIN SODIUM-DEXTROSE 2-4 GM/100ML-% IV SOLN
2.0000 g | INTRAVENOUS | Status: AC
  Administered 2023-10-20: 2 g via INTRAVENOUS

## 2023-10-20 MED ORDER — LIDOCAINE 2% (20 MG/ML) 5 ML SYRINGE
INTRAMUSCULAR | Status: AC
  Filled 2023-10-20: qty 5

## 2023-10-20 MED ORDER — MIDAZOLAM HCL 5 MG/5ML IJ SOLN
INTRAMUSCULAR | Status: DC | PRN
  Administered 2023-10-20: 2 mg via INTRAVENOUS

## 2023-10-20 MED ORDER — SUGAMMADEX SODIUM 200 MG/2ML IV SOLN
INTRAVENOUS | Status: DC | PRN
  Administered 2023-10-20: 300 mg via INTRAVENOUS

## 2023-10-20 MED ORDER — PROPOFOL 10 MG/ML IV BOLUS
INTRAVENOUS | Status: AC
  Filled 2023-10-20: qty 20

## 2023-10-20 MED ORDER — SODIUM CHLORIDE 0.9 % IV SOLN: INTRAVENOUS | Status: DC | PRN

## 2023-10-20 MED ORDER — OXYCODONE HCL 5 MG PO TABS: 5.0000 mg | ORAL_TABLET | Freq: Once | ORAL | Status: DC | PRN

## 2023-10-20 MED ORDER — DEXMEDETOMIDINE HCL IN NACL 80 MCG/20ML IV SOLN
INTRAVENOUS | Status: DC | PRN
  Administered 2023-10-20: 12 ug via INTRAVENOUS

## 2023-10-20 MED ORDER — PROPOFOL 10 MG/ML IV BOLUS
INTRAVENOUS | Status: DC | PRN
  Administered 2023-10-20: 180 mg via INTRAVENOUS

## 2023-10-20 MED ORDER — DEXAMETHASONE SODIUM PHOSPHATE 10 MG/ML IJ SOLN
INTRAMUSCULAR | Status: AC
  Filled 2023-10-20: qty 1

## 2023-10-20 MED ORDER — PHENYLEPHRINE HCL-NACL 20-0.9 MG/250ML-% IV SOLN
INTRAVENOUS | Status: DC | PRN
  Administered 2023-10-20: 50 ug/min via INTRAVENOUS

## 2023-10-20 MED ORDER — HYDROMORPHONE HCL 1 MG/ML IJ SOLN
INTRAMUSCULAR | Status: AC
  Filled 2023-10-20: qty 0.5

## 2023-10-20 MED ORDER — SODIUM CHLORIDE (PF) 0.9 % IJ SOLN
INTRAMUSCULAR | Status: AC
  Filled 2023-10-20: qty 20

## 2023-10-20 MED ORDER — OXYCODONE HCL 5 MG/5ML PO SOLN: 5.0000 mg | Freq: Once | ORAL | Status: DC | PRN

## 2023-10-20 MED ORDER — LIDOCAINE HCL (CARDIAC) PF 100 MG/5ML IV SOSY
PREFILLED_SYRINGE | INTRAVENOUS | Status: DC | PRN
  Administered 2023-10-20: 100 mg via INTRAVENOUS

## 2023-10-20 MED ORDER — 0.9 % SODIUM CHLORIDE (POUR BTL) OPTIME
TOPICAL | Status: DC | PRN
  Administered 2023-10-20: 1000 mL

## 2023-10-20 MED ORDER — BUPIVACAINE HCL (PF) 0.25 % IJ SOLN
INTRAMUSCULAR | Status: AC
  Filled 2023-10-20: qty 60

## 2023-10-20 SURGICAL SUPPLY — 56 items
BINDER BREAST 3XL (GAUZE/BANDAGES/DRESSINGS) IMPLANT
BINDER BREAST LRG (GAUZE/BANDAGES/DRESSINGS) IMPLANT
BINDER BREAST MEDIUM (GAUZE/BANDAGES/DRESSINGS) IMPLANT
BINDER BREAST XLRG (GAUZE/BANDAGES/DRESSINGS) IMPLANT
BINDER BREAST XXLRG (GAUZE/BANDAGES/DRESSINGS) IMPLANT
BIOPATCH RED 1 DISK 7.0 (GAUZE/BANDAGES/DRESSINGS) ×6 IMPLANT
BLADE SURG 10 STRL SS (BLADE) ×18 IMPLANT
BLADE SURG 15 STRL LF DISP TIS (BLADE) ×3 IMPLANT
CANISTER SUCT 1200ML W/VALVE (MISCELLANEOUS) ×3 IMPLANT
DERMABOND ADVANCED .7 DNX12 (GAUZE/BANDAGES/DRESSINGS) ×6 IMPLANT
DRAIN CHANNEL 19F RND (DRAIN) ×6 IMPLANT
DRAPE IMP U-DRAPE 54X76 (DRAPES) IMPLANT
DRAPE UTILITY XL STRL (DRAPES) ×3 IMPLANT
DRSG TEGADERM 4X4.75 (GAUZE/BANDAGES/DRESSINGS) ×6 IMPLANT
ELECT BLADE 4.0 EZ CLEAN MEGAD (MISCELLANEOUS) ×2
ELECT REM PT RETURN 9FT ADLT (ELECTROSURGICAL) ×4
ELECTRODE BLDE 4.0 EZ CLN MEGD (MISCELLANEOUS) ×3 IMPLANT
ELECTRODE REM PT RTRN 9FT ADLT (ELECTROSURGICAL) ×6 IMPLANT
EVACUATOR SILICONE 100CC (DRAIN) ×6 IMPLANT
GAUZE PAD ABD 8X10 STRL (GAUZE/BANDAGES/DRESSINGS) ×12 IMPLANT
GAUZE SPONGE 2X2 STRL 8-PLY (GAUZE/BANDAGES/DRESSINGS) ×6 IMPLANT
GLOVE BIO SURGEON STRL SZ 6.5 (GLOVE) IMPLANT
GLOVE BIO SURGEON STRL SZ7.5 (GLOVE) IMPLANT
GLOVE BIO SURGEON STRL SZ8 (GLOVE) ×3 IMPLANT
GLOVE BIOGEL PI IND STRL 7.0 (GLOVE) IMPLANT
GLOVE BIOGEL PI IND STRL 8 (GLOVE) ×3 IMPLANT
GOWN STRL REUS W/ TWL LRG LVL3 (GOWN DISPOSABLE) ×3 IMPLANT
GOWN STRL REUS W/TWL XL LVL3 (GOWN DISPOSABLE) ×3 IMPLANT
HEMOSTAT ARISTA ABSORB 3G PWDR (HEMOSTASIS) IMPLANT
HIBICLENS CHG 4% 4OZ BTL (MISCELLANEOUS) ×3 IMPLANT
MARKER SKIN DUAL TIP RULER LAB (MISCELLANEOUS) ×3 IMPLANT
NDL HYPO 22X1.5 SAFETY MO (MISCELLANEOUS) ×6 IMPLANT
NEEDLE HYPO 22X1.5 SAFETY MO (MISCELLANEOUS) ×4
NS IRRIG 1000ML POUR BTL (IV SOLUTION) ×3 IMPLANT
PACK BASIN DAY SURGERY FS (CUSTOM PROCEDURE TRAY) ×3 IMPLANT
PACK UNIVERSAL I (CUSTOM PROCEDURE TRAY) ×3 IMPLANT
PENCIL SMOKE EVACUATOR (MISCELLANEOUS) ×6 IMPLANT
PIN SAFETY STERILE (MISCELLANEOUS) ×3 IMPLANT
SLEEVE SCD COMPRESS KNEE MED (STOCKING) ×3 IMPLANT
SPONGE T-LAP 18X18 ~~LOC~~+RFID (SPONGE) ×9 IMPLANT
STAPLER SKIN PROX WIDE 3.9 (STAPLE) ×3 IMPLANT
SUT MNCRL AB 3-0 PS2 27 (SUTURE) ×12 IMPLANT
SUT MNCRL AB 4-0 PS2 18 (SUTURE) ×12 IMPLANT
SUT MON AB 2-0 CT1 36 (SUTURE) ×3 IMPLANT
SUT MON AB 5-0 PS2 18 (SUTURE) IMPLANT
SUT SILK 2 0 SH (SUTURE) ×6 IMPLANT
SUT VIC AB 3-0 SH 27X BRD (SUTURE) IMPLANT
SYR 10ML LL (SYRINGE) IMPLANT
SYR 20ML LL LF (SYRINGE) ×6 IMPLANT
SYR BULB IRRIG 60ML STRL (SYRINGE) ×3 IMPLANT
SYR CONTROL 10ML LL (SYRINGE) ×3 IMPLANT
TOWEL GREEN STERILE FF (TOWEL DISPOSABLE) ×6 IMPLANT
TRAY DSU PREP LF (CUSTOM PROCEDURE TRAY) ×3 IMPLANT
TUBE CONNECTING 20X1/4 (TUBING) ×3 IMPLANT
UNDERPAD 30X36 HEAVY ABSORB (UNDERPADS AND DIAPERS) ×6 IMPLANT
YANKAUER SUCT BULB TIP NO VENT (SUCTIONS) ×3 IMPLANT

## 2023-10-20 NOTE — Discharge Instructions (Addendum)
 Activity: Avoid strenuous activity.  No lifting, pushing, or pulling greater than 15 pounds.  Diet: No restrictions.  Try to optimize nutrition with plenty of proteins, fruits, and vegetables to improve healing.   Wound Care: Leave breast binder on for the first week and then you may transition to a front-clipping or front-zipping compression bra.  Sponge bathe only until our visit tomorrow, then we can discuss transition to showering with the emphasis on keeping the surgical site dry.  Replace the ABD pads over incisions with gauze or Maxi pads as needed for incisional drainage.   If you have drains placed, be sure to record the daily output from each side. They will be removed at your appointment tomorrow. Please make sure that the bulbs are charged.  If you experience any issues, please call the office.    Follow-Up: Scheduled for tomorrow.  Things to watch for:  Call the office if you experience fever, chills, intractable vomiting, or significant bleeding.  Mild wound drainage is common after breast reduction surgery and should not be cause for alarm.    Information for Discharge Teaching: EXPAREL  (bupivacaine  liposome injectable suspension)   Pain relief is important to your recovery. The goal is to control your pain so you can move easier and return to your normal activities as soon as possible after your procedure. Your physician may use several types of medicines to manage pain, swelling, and more.  Your surgeon or anesthesiologist gave you EXPAREL (bupivacaine ) to help control your pain after surgery.  EXPAREL  is a local anesthetic designed to release slowly over an extended period of time to provide pain relief by numbing the tissue around the surgical site. EXPAREL  is designed to release pain medication over time and can control pain for up to 72 hours. Depending on how you respond to EXPAREL , you may require less pain medication during your recovery. EXPAREL  can help reduce or  eliminate the need for opioids during the first few days after surgery when pain relief is needed the most. EXPAREL  is not an opioid and is not addictive. It does not cause sleepiness or sedation.   Important! A teal colored band has been placed on your arm with the date, time and amount of EXPAREL  you have received. Please leave this armband in place for the full 96 hours following administration, and then you may remove the band. If you return to the hospital for any reason within 96 hours following the administration of EXPAREL , the armband provides important information that your health care providers to know, and alerts them that you have received this anesthetic.    Possible side effects of EXPAREL : Temporary loss of sensation or ability to move in the area where medication was injected. Nausea, vomiting, constipation Rarely, numbness and tingling in your mouth or lips, lightheadedness, or anxiety may occur. Call your doctor right away if you think you may be experiencing any of these sensations, or if you have other questions regarding possible side effects.  Follow all other discharge instructions given to you by your surgeon or nurse. Eat a healthy diet and drink plenty of water  or other fluids.   Post Anesthesia Home Care Instructions  Activity: Get plenty of rest for the remainder of the day. A responsible individual must stay with you for 24 hours following the procedure.  For the next 24 hours, DO NOT: -Drive a car -Advertising copywriter -Drink alcoholic beverages -Take any medication unless instructed by your physician -Make any legal decisions or sign important papers.  Meals: Start with liquid foods such as gelatin or soup. Progress to regular foods as tolerated. Avoid greasy, spicy, heavy foods. If nausea and/or vomiting occur, drink only clear liquids until the nausea and/or vomiting subsides. Call your physician if vomiting continues.  Special Instructions/Symptoms: Your  throat may feel dry or sore from the anesthesia or the breathing tube placed in your throat during surgery. If this causes discomfort, gargle with warm salt water . The discomfort should disappear within 24 hours.  If you had a scopolamine  patch placed behind your ear for the management of post- operative nausea and/or vomiting:  1. The medication in the patch is effective for 72 hours, after which it should be removed.  Wrap patch in a tissue and discard in the trash. Wash hands thoroughly with soap and water . 2. You may remove the patch earlier than 72 hours if you experience unpleasant side effects which may include dry mouth, dizziness or visual disturbances. 3. Avoid touching the patch. Wash your hands with soap and water  after contact with the patch.     About my Jackson-Pratt Bulb Drain  What is a Jackson-Pratt bulb? A Jackson-Pratt is a soft, round device used to collect drainage. It is connected to a long, thin drainage catheter, which is held in place by one or two small stiches near your surgical incision site. When the bulb is squeezed, it forms a vacuum, forcing the drainage to empty into the bulb.  Emptying the Jackson-Pratt bulb- To empty the bulb: 1. Release the plug on the top of the bulb. 2. Pour the bulb's contents into a measuring container which your nurse will provide. 3. Record the time emptied and amount of drainage. Empty the drain(s) as often as your     doctor or nurse recommends.  Date                  Time                    Amount (Drain 1)                 Amount (Drain  2)  _____________________________________________________________________  _____________________________________________________________________  _____________________________________________________________________  _____________________________________________________________________  _____________________________________________________________________  _____________________________________________________________________  _____________________________________________________________________  _____________________________________________________________________  Squeezing the Jackson-Pratt Bulb- To squeeze the bulb: 1. Make sure the plug at the top of the bulb is open. 2. Squeeze the bulb tightly in your fist. You will hear air squeezing from the bulb. 3. Replace the plug while the bulb is squeezed. 4. Use a safety pin to attach the bulb to your clothing. This will keep the catheter from     pulling at the bulb insertion site.  When to call your doctor- Call your doctor if: Drain site becomes red, swollen or hot. You have a fever greater than 101 degrees F. There is oozing at the drain site. Drain falls out (apply a guaze bandage over the drain hole and secure it with tape). Drainage increases daily not related to activity patterns. (You will usually have more drainage when you are active than when you are resting.) Drainage has a bad odor.

## 2023-10-20 NOTE — Anesthesia Preprocedure Evaluation (Signed)
 Anesthesia Evaluation  Patient identified by MRN, date of birth, ID band Patient awake    Reviewed: Allergy & Precautions, H&P , NPO status , Patient's Chart, lab work & pertinent test results  Airway Mallampati: II   Neck ROM: full    Dental   Pulmonary asthma    breath sounds clear to auscultation       Cardiovascular negative cardio ROS  Rhythm:regular Rate:Normal     Neuro/Psych  Headaches, Seizures -,  PSYCHIATRIC DISORDERS Anxiety  Bipolar Disorder      GI/Hepatic ,GERD  ,,  Endo/Other    Class 3 obesity  Renal/GU      Musculoskeletal   Abdominal   Peds  Hematology   Anesthesia Other Findings   Reproductive/Obstetrics                             Anesthesia Physical Anesthesia Plan  ASA: 2  Anesthesia Plan: General   Post-op Pain Management:    Induction: Intravenous  PONV Risk Score and Plan: 3 and Ondansetron , Dexamethasone , Midazolam  and Treatment may vary due to age or medical condition  Airway Management Planned: Oral ETT  Additional Equipment:   Intra-op Plan:   Post-operative Plan: Extubation in OR  Informed Consent: I have reviewed the patients History and Physical, chart, labs and discussed the procedure including the risks, benefits and alternatives for the proposed anesthesia with the patient or authorized representative who has indicated his/her understanding and acceptance.     Dental advisory given  Plan Discussed with: CRNA, Anesthesiologist and Surgeon  Anesthesia Plan Comments:        Anesthesia Quick Evaluation

## 2023-10-20 NOTE — Transfer of Care (Signed)
 Immediate Anesthesia Transfer of Care Note  Patient: Shannon Lin  Procedure(s) Performed: bilateral breast reduction (Bilateral: Breast) EXCISION OF SKIN TAG (Bilateral: Chest)  Patient Location: PACU  Anesthesia Type:General  Level of Consciousness: awake, alert , and patient cooperative  Airway & Oxygen Therapy: Patient Spontanous Breathing and Patient connected to face mask oxygen  Post-op Assessment: Report given to RN and Post -op Vital signs reviewed and stable  Post vital signs: Reviewed and stable  Last Vitals:  Vitals Value Taken Time  BP 118/69 10/20/23 1030  Temp    Pulse 87 10/20/23 1033  Resp 19 10/20/23 1033  SpO2 97 % 10/20/23 1033  Vitals shown include unfiled device data.  Last Pain:  Vitals:   10/20/23 0635  TempSrc: Oral  PainSc: 0-No pain      Patients Stated Pain Goal: 5 (10/20/23 9364)  Complications: No notable events documented.

## 2023-10-20 NOTE — Anesthesia Procedure Notes (Signed)
 Procedure Name: Intubation Date/Time: 10/20/2023 7:52 AM  Performed by: Burnard Rosaline HERO, CRNAPre-anesthesia Checklist: Patient identified, Emergency Drugs available, Suction available and Patient being monitored Patient Re-evaluated:Patient Re-evaluated prior to induction Oxygen Delivery Method: Circle system utilized Preoxygenation: Pre-oxygenation with 100% oxygen Induction Type: IV induction Ventilation: Mask ventilation without difficulty Laryngoscope Size: Mac and 4 Grade View: Grade I Tube type: Oral Tube size: 7.0 mm Number of attempts: 1 Airway Equipment and Method: Stylet and Oral airway Placement Confirmation: ETT inserted through vocal cords under direct vision, positive ETCO2, breath sounds checked- equal and bilateral and CO2 detector Secured at: 23 cm Tube secured with: Tape Dental Injury: Teeth and Oropharynx as per pre-operative assessment

## 2023-10-20 NOTE — Telephone Encounter (Signed)
 Campbell Riches, NP     10/20/23  4:20 PM Shannon Lin, she just had surgery today. The labs were ordered by her surgeon. I recommend follow up with [place] once she has recovered to discuss her prediabetes (she already has this as a diagnosis). Thanks

## 2023-10-20 NOTE — Op Note (Signed)
 DATE OF OPERATION: 10/20/2023  LOCATION: [facility] operating Room  PREOPERATIVE DIAGNOSIS: Symptomatic macromastia, benign skin tags x 4  POSTOPERATIVE DIAGNOSIS: Same  PROCEDURE: Bilateral breast reduction, excision of skin tags x 4  SURGEON: Marinell Birmingham, MD  ASSISTANT: Honora Seip  EBL: 100 cc  CONDITION: Stable  COMPLICATIONS: None  INDICATION: The patient, Shannon Lin, is a 33 y.o. female born on 06-10-1991, is here for treatment of upper back and neck pain secondary to large breast size.   PROCEDURE DETAILS:  The patient was seen prior to surgery and marked.   IV antibiotics were given. The patient was taken to the operating room and given a general anesthetic. A standard time out was performed and all information was confirmed by those in the room. SCDs were placed.   The chest was prepped and draped in usual sterile manner.  The nipple and areolar complexes were marked with a 42 mm cookie cutter and an 8 cm with inferior pedicle was marked on each breast.  The right breast was addressed first.  Laparotomy tape was placed at the base of breast as a tourniquet and the pedicle was de-epithelialized sharply.  The electrocautery was then used to dissect the borders of the pedicle down to the chest wall.  The electrocautery was then used to resect the medial lateral and superior triangles of breast tissue and to develop and thin the superior skin flap.  The breast tissue removed constituted the bulk of the reduction and weighed 870 g.  The wound was irrigated with warm normal saline and the pectoralis major in the subfascial space and the subcutaneous tissues were infiltrated with 50 mL of a mixture of Exparel  quarter percent plain Marcaine  and saline.  Meticulous hemostasis was achieved with the electrocautery.  A 19 French round drain was placed behind the pedicle and brought out through a separate stab incision.  The T point was approximated with a single 2-0 Monocryl suture  and the skin edges tailor tacked in place with skin clips.  The dermis was closed with interrupted and running 3-0 Monocryl sutures and the skin was closed with a running 4-0 Monocryl subcuticular stitch.  Attention was turned to the left breast where similar procedure was performed.  After the pedicle was de-epithelialized the borders were dissected to the chest wall with the electrocautery and the medial lateral and superior triangles of breast tissue were resected.  The superior skin flap was developed and thinned.  The breast tissue removed on the left side weighed 925 g.  Again the surgical site was irrigated then infiltrated with the Exparel  mixture.  Meticulous hemostasis was achieved with the electrocautery.  A 19 French round drain was placed behind the pedicle and brought out through a separate stab incision.  The T point was approximated with a 2-0 Monocryl suture and the skin edges tailor tacked in place with skin clips.  The dermis was closed with interrupted and running 3-0 Monocryl sutures and the skin was closed with a running 4-0 Monocryl subcuticular stitch.  All incisions were sealed with Dermabond.  The skin tags were then excised at the level of the dermis with scissors.  Each of the excisions was approximately 2 mm.  The skin tags were not sent to pathology.  The patient was placed in a supportive compressive garment and awakened from anesthesia without incident she was transferred to the recovery room in good condition.  All instrument needle and sponge counts were reported as correct and no complications  were appreciated during the surgery. The patient was allowed to wake up and taken to recovery room in stable condition at the end of the case. The family was notified at the end of the case.   The advanced practice practitioner (APP) assisted throughout the case.  The APP was essential in retraction and counter traction when needed to make the case progress smoothly.  This retraction and  assistance made it possible to see the tissue plans for the procedure.  The assistance was needed for blood control, tissue re-approximation and assisted with closure of the incision site.

## 2023-10-20 NOTE — Interval H&P Note (Signed)
 History and Physical Interval Note: No change in exam or indication for surgery Marked for a bilateral breast reduction with her assistance All questions answered. Will proceed at her request  10/20/2023 7:16 AM  Shannon Lin  has presented today for surgery, with the diagnosis of Morbid obesity.  The various methods of treatment have been discussed with the patient and family. After consideration of risks, benefits and other options for treatment, the patient has consented to  Procedure(s): bilateral breast reduction (Bilateral) as a surgical intervention.  The patient's history has been reviewed, patient examined, no change in status, stable for surgery.  I have reviewed the patient's chart and labs.  Questions were answered to the patient's satisfaction.     [name]

## 2023-10-21 ENCOUNTER — Encounter (HOSPITAL_BASED_OUTPATIENT_CLINIC_OR_DEPARTMENT_OTHER): Payer: Self-pay | Admitting: Plastic Surgery

## 2023-10-21 ENCOUNTER — Ambulatory Visit: Payer: MEDICAID | Admitting: Physician Assistant

## 2023-10-21 VITALS — BP 112/75 | HR 78 | Ht 61.0 in | Wt 206.6 lb

## 2023-10-21 DIAGNOSIS — Z9889 Other specified postprocedural states: Secondary | ICD-10-CM

## 2023-10-21 LAB — SURGICAL PATHOLOGY

## 2023-10-21 NOTE — Progress Notes (Signed)
Patient is a pleasant 33 year old female s/p bilateral breast reduction performed 10/20/2023 Dr. Ladona Ridgel who presents to clinic for postoperative follow-up and consideration of drain removal.  Reviewed operative report and 870 g removed from the right breast, 925 g removed from the left breast.  Skin tags were also removed at time of surgery.  Today, patient is doing well from a postoperative standpoint.  She states that she required a single oxycodone last night, but has otherwise been doing okay with Tylenol and ibuprofen alone.  She has been tolerating p.o. intake without difficulty.  Voiding urine and bowels.  Ambulatory.  Denies any leg swelling, chest discomfort, or difficulty breathing.  On exam, breasts have good shape and symmetry.  NAC's are healthy and viable, sensitive to light touch.  Mild ecchymoses over pedicles bilaterally which is expected.  Incisions appear largely CDI throughout.  No palpable underlying fluid collections concerning for seroma or hematoma.  Drain output has been minimal, approximately 40 cc per drain since time of surgery yesterday morning.  Normal-appearing output in bulb and tubes bilaterally.  Functional.  Drains removed without complication or difficulty, some discomfort per patient.  She is doing excellent from a postoperative standpoint and her exam is entirely benign.  Discussed what to watch out for.  Follow-up next week, as scheduled.  She can call the office should she have questions or concerns in interim.

## 2023-10-21 NOTE — Anesthesia Postprocedure Evaluation (Signed)
 Anesthesia Post Note  Patient: Shannon Lin  Procedure(s) Performed: bilateral breast reduction (Bilateral: Breast) EXCISION OF SKIN TAG (Bilateral: Chest)     Patient location during evaluation: PACU Anesthesia Type: General Level of consciousness: awake and alert Pain management: pain level controlled Vital Signs Assessment: post-procedure vital signs reviewed and stable Respiratory status: spontaneous breathing, nonlabored ventilation, respiratory function stable and patient connected to nasal cannula oxygen Cardiovascular status: blood pressure returned to baseline and stable Postop Assessment: no apparent nausea or vomiting Anesthetic complications: no   No notable events documented.  Last Vitals:  Vitals:   10/20/23 1130 10/20/23 1213  BP: (!) 102/57 102/67  Pulse: 76 64  Resp: 12 16  Temp:  (!) 36.2 C  SpO2: 91% 95%    Last Pain:  Vitals:   10/20/23 1213  TempSrc: Temporal  PainSc: 0-No pain                 Jeral Zick S

## 2023-10-25 NOTE — Telephone Encounter (Signed)
I called the patient in regards to her MyChart message. She states that she had a fever several days ago and was concerned. She denies any fevers since then. She denies any pain or issues with her surgical sites. She reports she feels she is starting to develop a cold as she has been feeling congested and has been coughing. I recommended she follow up with her PCP in regards to this. Patient states she does have an appointment with our clinic on Wednesday. I instructed her to call us before then if she has any concerns or questions.

## 2023-10-26 ENCOUNTER — Telehealth: Payer: Self-pay

## 2023-10-26 NOTE — Telephone Encounter (Signed)
Patient's mother, Arline Asp called (pt was on the phone as well) stating patient is experiencing more tightness and slight swelling on the R side than the left. Pt had BL breast reduction by Dr. Ladona Ridgel 1/14. I adv patient is 6 days post op which swelling and tightness is normal especially when there are no other signs of infection. Mom stated pt has no fever, surgical sites are not red or warm to the touch, nor is she in a lot of pain. Pt is eating and voiding fine. Mom verbalized she gave patient doxycycline last Friday because she sounded like she was coming down with something. I confirmed doxy was not prescribed my Dr. Ladona Ridgel nor any of our providers here. Adv patient's last post op was with Gerre Pebbles, [place] who is on call and that I would send a message. Both patient and mom conveyed understanding.

## 2023-10-27 ENCOUNTER — Ambulatory Visit (INDEPENDENT_AMBULATORY_CARE_PROVIDER_SITE_OTHER): Payer: MEDICAID | Admitting: Women's Health

## 2023-10-27 ENCOUNTER — Encounter: Payer: Self-pay | Admitting: Women's Health

## 2023-10-27 ENCOUNTER — Other Ambulatory Visit (HOSPITAL_COMMUNITY)
Admission: RE | Admit: 2023-10-27 | Discharge: 2023-10-27 | Disposition: A | Discharge status: Home / Self Care | Payer: MEDICAID | Source: Ambulatory Visit | Attending: Women's Health | Admitting: Women's Health

## 2023-10-27 VITALS — BP 115/80 | Ht 61.0 in | Wt 204.6 lb

## 2023-10-27 DIAGNOSIS — N939 Abnormal uterine and vaginal bleeding, unspecified: Secondary | ICD-10-CM | POA: Insufficient documentation

## 2023-10-27 DIAGNOSIS — N946 Dysmenorrhea, unspecified: Secondary | ICD-10-CM

## 2023-10-27 MED ORDER — MEGESTROL ACETATE 40 MG PO TABS: ORAL_TABLET | ORAL | 1 refills | Status: DC

## 2023-10-27 NOTE — Progress Notes (Signed)
GYN VISIT Patient name: Shannon Lin MRN [id number]  Date of birth: 1990-12-18 Chief Complaint:   Menstrual Problem (Bleeding started last Friday with clots)  History of Present Illness:   Shannon Lin is a 33 y.o. G0P0000 Caucasian female being seen today for heavy painful bleeding. Had breast reduction surgery last Tuesday, Wed have white vaginal d/c, no odor/itching. Bleeding started Friday, has been having clots (about dime sized) and normally doesn't, has changed pad 4 times today. Painful cramps. Doesn't want to bleed at all. Nexplanon placed 08/13/21.  No LMP recorded (lmp unknown). Patient has had an implant. The current method of family planning is Nexplanon.  Last pap 10/02/21. Results were: NILM w/ HRHPV negative     08/14/2023    2:52 PM 02/06/2023    1:15 PM 11/28/2022    3:36 PM 08/22/2022    1:16 PM 10/02/2021    2:26 PM  Depression screen PHQ 2/9  Decreased Interest  0 0 0 0  Down, Depressed, Hopeless 0 0 0 1 0  PHQ - 2 Score 0 0 0 1 0  Altered sleeping 1 0 2 0   Tired, decreased energy 0 0 0 0   Change in appetite 0 0 0 0   Feeling bad or failure about yourself  0 0 0 0   Trouble concentrating 0 0 0 0   Moving slowly or fidgety/restless 0 0 0 0   Suicidal thoughts 0 0 0 0   PHQ-9 Score 1 0 2 1   Difficult doing work/chores Not difficult at all Not difficult at all  Not difficult at all         08/14/2023    2:52 PM 02/06/2023    1:16 PM 11/28/2022    3:37 PM 08/22/2022    1:16 PM  GAD 7 : Generalized Anxiety Score  Nervous, Anxious, on Edge 1 0 0 0  Control/stop worrying 0 0 1 0  Worry too much - different things 0 0 0 0  Trouble relaxing 1 0 1 0  Restless 0 0 0 0  Easily annoyed or irritable 0 0 1 0  Afraid - awful might happen 1 0 0 0  Total GAD 7 Score 3 0 3 0  Anxiety Difficulty  Not difficult at all  Not difficult at all     Review of Systems:   Pertinent items are noted in HPI Denies fever/chills, dizziness, headaches, visual disturbances,  fatigue, shortness of breath, chest pain, abdominal pain, vomiting, abnormal vaginal discharge/itching/odor/irritation, problems with periods, bowel movements, urination, or intercourse unless otherwise stated above.  Pertinent History Reviewed:  Reviewed past medical,surgical, social, obstetrical and family history.  Reviewed problem list, medications and allergies. Physical Assessment:   Vitals:   10/27/23 1453  BP: 115/80  Weight: 204 lb 9.6 oz (92.8 kg)  Height: 5\' 1"  (1.549 m)  Body mass index is 38.66 kg/m.       Physical Examination:   General appearance: alert, well appearing, and in no distress  Mental status: alert, oriented to person, place, and time  Skin: warm & dry   Cardiovascular: normal heart rate noted  Respiratory: normal respiratory effort, no distress  Abdomen: soft, non-tender   Pelvic: VULVA: normal appearing vulva with no masses, tenderness or lesions, VAGINA: normal appearing vagina with normal color and discharge, no lesions, CERVIX: normal appearing cervix without discharge or lesions, UTERUS: uterus is normal size, shape, consistency and nontender, ADNEXA: normal adnexa in size and no masses, slightly  tender Rt adnexa  Extremities: no edema   Chaperone: Latisha Cresenzo    No results found for this or any previous visit (from the past 24 hours).  Assessment & Plan:  1) Heavier bleeding w/ painful cramps and clots on Nexplanon> CV swab, rx megace, let us know if not improving  Meds:  Meds ordered this encounter  Medications   megestrol (MEGACE) 40 MG tablet    Sig: 3x5d, 2x5d, then 1 daily to help control vaginal bleeding. Stop taking when bleeding stops.    Dispense:  45 tablet    Refill:  1    No orders of the defined types were placed in this encounter.   Return for Nov for Nexp removal/reinsertion, earlier if needed.  Cheral Marker CNM, [facility] 10/27/2023 4:28 PM

## 2023-10-27 NOTE — Telephone Encounter (Signed)
Patient's mother called in regarding breast reduction done last Tuesday. She states 1 breast is a little more swollen than the other and is achy. No redness, warmth, fever, n/v/d. Incisions look well. Just wanted to make sure everything was ok or if she needed to be seen. I will forward to front desk staff to have scheduled to be seen just to make sure things look fine.

## 2023-10-28 ENCOUNTER — Ambulatory Visit: Payer: MEDICAID | Admitting: Plastic Surgery

## 2023-10-28 VITALS — BP 116/71 | HR 75 | Ht 61.0 in | Wt 204.8 lb

## 2023-10-28 DIAGNOSIS — Z9889 Other specified postprocedural states: Secondary | ICD-10-CM

## 2023-10-28 LAB — CERVICOVAGINAL ANCILLARY ONLY
Bacterial Vaginitis (gardnerella): NEGATIVE
Candida Glabrata: NEGATIVE
Candida Vaginitis: NEGATIVE
Chlamydia: NEGATIVE
Comment: NEGATIVE
Comment: NEGATIVE
Comment: NEGATIVE
Comment: NEGATIVE
Comment: NEGATIVE
Comment: NORMAL
Neisseria Gonorrhea: NEGATIVE
Trichomonas: NEGATIVE

## 2023-10-28 NOTE — Progress Notes (Signed)
Shannon Lin returns today approximately 10 days postop from a bilateral breast reduction.  She states she is doing well though she has noticed some increased tenderness on the lateral aspect of her right breast and she also notes some tightness especially along the inframammary fold incision on the right.  She does note that she is used very little pain medicine and that she has been having normal bowel movements.  She has had no fevers or chills.  On examination there is a little tightness along the inframammary fold incision but nothing unexpected from recent surgery.  She does have more ecchymoses on the right side.  Her size and shape is quite nice with good symmetry.  All incisions are clean dry and intact.  The nipples are warm and well-perfused.  Patient is reassured.  She may begin scar massage.  She may begin slowly increasing activity.  Keep scheduled appointment in February.  May return sooner for any questions or concerns.

## 2023-10-29 ENCOUNTER — Encounter: Payer: Self-pay | Admitting: Women's Health

## 2023-11-01 ENCOUNTER — Encounter: Payer: Self-pay | Admitting: Plastic Surgery

## 2023-11-06 ENCOUNTER — Ambulatory Visit (INDEPENDENT_AMBULATORY_CARE_PROVIDER_SITE_OTHER): Payer: MEDICAID | Admitting: Physician Assistant

## 2023-11-06 VITALS — BP 129/84 | HR 87

## 2023-11-06 DIAGNOSIS — Z9889 Other specified postprocedural states: Secondary | ICD-10-CM

## 2023-11-06 NOTE — Progress Notes (Cosign Needed)
Patient is a 33 year old female s/p bilateral breast reduction performed 10/20/2023 Dr. Ladona Ridgel who presents to clinic for postoperative follow-up.  She was last seen here in clinic on 10/29/2023.  At that time, she had reported some increased tenderness on the lateral aspect of her right breast, but was otherwise doing well.  Exam was benign.  Recommend scar massage and slowly increasing activity.  Today, patient is doing well.  She is accompanied by her boyfriend at bedside.  She states that the sutures have caused a lot of irritation on her skin, particularly medially on the inside of her left breast.  She also states that she has an area of firmness on the upper part of her left breast that is tender to touch.  She denies any fevers, leg swelling, chest pain, difficulty breathing, incisional drainage, or other concerns.  On exam, breasts have excellent shape and symmetry.  Soft throughout with the exception of a palpable, discrete area of firmness at the 2 o'clock position left breast.  It is approximately 3 x 4 cm.  No overlying skin changes.  Incisions are CDI throughout.  Few scattered sutures are removed without complication or difficulty.  There are still a couple residual knots that are underneath Dermabond which will not be removed today.  Recommend continued Vaseline or Aquaphor to her incisions throughout.  She can continue to massage the area of firmness on the upper outer part of her left breast.  This is likely reflective of fat necrosis versus scar tissue formation.  No abscess, hematoma, seroma, or evidence of infection.  She is doing excellent from a postoperative standpoint.  Return in 2 weeks for reevaluation.  Will obtain photos at that time.

## 2023-11-11 ENCOUNTER — Encounter: Payer: MEDICAID | Admitting: Physician Assistant

## 2023-11-18 ENCOUNTER — Encounter: Payer: Self-pay | Admitting: Plastic Surgery

## 2023-11-19 NOTE — Progress Notes (Signed)
Patient is a 33 year old female s/p bilateral breast reduction performed 10/20/2023 Dr. Ladona Ridgel who presents to clinic for postoperative follow-up.   She was last seen here in clinic on 11/06/2023.  At that time, there is a discrete area of firmness at 2 o'clock position left breast measuring approximately 3 x 4 cm.  Incision CDI and she is otherwise doing well from a postoperative standpoint.  Recommend Vaseline to her incisions throughout.  Can continue with massage of the area of firmness on the upper outer part of left breast.   Today, she is doing well.  She is accompanied by her partner at bedside.  She tells me that the area of firmness left lateral breast has persisted, but mildly improved with mechanical massage.  She was hoping to see Dr. Ladona Ridgel today and tells me that she and her mother had discussed a follow-up with him specifically.  She informs me that she was able to remove one of the stitches at home by gently massaging her incisions.  She has been applying Vaseline throughout and most of her Dermabond has sloughed off.  She expresses significant improvement in her chronic upper back and neck discomfort from her large breasts since the reduction.  She is overall pleased.  On exam, breasts have improved shape and symmetry.  NAC's are healthy and viable.  Right breast incisions CDI and well-healed throughout.  Left breast inferior T zone has a couple of small scattered incisional wounds with overlying scabbing.  Otherwise well-healed.  Persistent area of mild firmness left lateral breast.  No crepitus or fluctuance.  No overlying skin changes.  Her exam today is largely benign.  Informed patient that she can begin to slowly increase activity.  She may also transition to silicone scar gels in 1 week, at least to the areas that do not have overlying scabbing.  She can follow-up with Dr. Ladona Ridgel for likely final postoperative encounter, per her and her mom's request.   Picture(s) obtained of the  patient and placed in the chart were with the patient's or guardian's permission.

## 2023-11-20 ENCOUNTER — Ambulatory Visit: Payer: MEDICAID | Admitting: Physician Assistant

## 2023-11-20 ENCOUNTER — Encounter: Payer: Self-pay | Admitting: Physician Assistant

## 2023-11-20 VITALS — BP 133/93 | HR 73

## 2023-11-20 DIAGNOSIS — Z9889 Other specified postprocedural states: Secondary | ICD-10-CM

## 2023-12-04 ENCOUNTER — Encounter (INDEPENDENT_AMBULATORY_CARE_PROVIDER_SITE_OTHER): Payer: Self-pay

## 2023-12-07 ENCOUNTER — Encounter (INDEPENDENT_AMBULATORY_CARE_PROVIDER_SITE_OTHER): Payer: Self-pay

## 2023-12-07 ENCOUNTER — Ambulatory Visit (INDEPENDENT_AMBULATORY_CARE_PROVIDER_SITE_OTHER): Payer: MEDICAID | Admitting: Gastroenterology

## 2023-12-08 ENCOUNTER — Encounter: Payer: Self-pay | Admitting: Plastic Surgery

## 2023-12-11 ENCOUNTER — Encounter: Payer: MEDICAID | Admitting: Physician Assistant

## 2023-12-14 ENCOUNTER — Ambulatory Visit (INDEPENDENT_AMBULATORY_CARE_PROVIDER_SITE_OTHER): Payer: MEDICAID | Admitting: Plastic Surgery

## 2023-12-14 DIAGNOSIS — Z9889 Other specified postprocedural states: Secondary | ICD-10-CM

## 2023-12-14 NOTE — Progress Notes (Signed)
 Ms. Shannon Lin returns today approximately the 2-1/2 months postop from a bilateral breast reduction.  She had some minor concerns about decreased sensation in the skin of the left breast as well as some areas of scarring.  She is very happy with the overall results and notes that her upper back and neck pain has resolved.  She is very happy with the appearance of the scars as well  On physical examination she has nice symmetry.  Scars are well-healed.  Nipples are warm and well-perfused.  Status post bilateral breast reduction: We discussed the decreased feeling in the skin of the left breast.  Most of the time this will resolve however it can take up to a year before full feeling returns.  She did note some small skin tags especially on the right side of her neck which she would like to have removed.  We will schedule her for removal of the skin tags in the office.

## 2023-12-29 ENCOUNTER — Encounter: Payer: Self-pay | Admitting: Family Medicine

## 2023-12-29 ENCOUNTER — Other Ambulatory Visit: Payer: Self-pay

## 2023-12-29 ENCOUNTER — Ambulatory Visit: Payer: MEDICAID | Admitting: Family Medicine

## 2023-12-29 VITALS — HR 82 | Temp 98.3°F | Ht 61.0 in | Wt 204.0 lb

## 2023-12-29 DIAGNOSIS — R519 Headache, unspecified: Secondary | ICD-10-CM

## 2023-12-29 DIAGNOSIS — J301 Allergic rhinitis due to pollen: Secondary | ICD-10-CM | POA: Diagnosis not present

## 2023-12-29 DIAGNOSIS — Z809 Family history of malignant neoplasm, unspecified: Secondary | ICD-10-CM

## 2023-12-29 DIAGNOSIS — J3489 Other specified disorders of nose and nasal sinuses: Secondary | ICD-10-CM

## 2023-12-29 DIAGNOSIS — Z79899 Other long term (current) drug therapy: Secondary | ICD-10-CM

## 2023-12-29 DIAGNOSIS — D509 Iron deficiency anemia, unspecified: Secondary | ICD-10-CM | POA: Diagnosis not present

## 2023-12-29 DIAGNOSIS — E785 Hyperlipidemia, unspecified: Secondary | ICD-10-CM | POA: Diagnosis not present

## 2023-12-29 DIAGNOSIS — R5383 Other fatigue: Secondary | ICD-10-CM | POA: Diagnosis not present

## 2023-12-29 DIAGNOSIS — F419 Anxiety disorder, unspecified: Secondary | ICD-10-CM

## 2023-12-29 DIAGNOSIS — F319 Bipolar disorder, unspecified: Secondary | ICD-10-CM

## 2023-12-29 DIAGNOSIS — R0683 Snoring: Secondary | ICD-10-CM | POA: Diagnosis not present

## 2023-12-29 DIAGNOSIS — R739 Hyperglycemia, unspecified: Secondary | ICD-10-CM

## 2023-12-29 MED ORDER — METFORMIN HCL 500 MG PO TABS: ORAL_TABLET | ORAL | 1 refills | Status: DC

## 2023-12-29 MED ORDER — CETIRIZINE HCL 10 MG PO TABS: 10.0000 mg | ORAL_TABLET | Freq: Every day | ORAL | 2 refills | Status: DC

## 2023-12-29 MED ORDER — FLUTICASONE PROPIONATE 50 MCG/ACT NA SUSP: 2.0000 | Freq: Every day | NASAL | 5 refills | Status: DC

## 2023-12-29 NOTE — Progress Notes (Signed)
   Subjective:    Patient ID: Shannon Lin, female    DOB: [birth year], 33 y.o.   MRN: [id number]  HPI Dizzy-patient relates a fair amount of dizziness described as feeling lightheaded after certain movements and with getting up.  Denies any passing out spells  Headaches-frequent headaches intermittent nausea with it no double vision no blurred vision does not wake her up at night  Sore throat and snoring-patient states she has a lot of nasal congestion ends up being a mouth breather snores a lot finds her self feeling fatigued and tired frequently throughout the day Patient snores a lot at night at times wakes herself up with the snoring her husband relates at times she has unusual breathing at night and patient relates a lot of fatigue tiredness during the day and feeling sleepy at times she does not feel real well rested when she wakes up in the morning   Review of Systems     Objective:   Physical Exam General-in no acute distress Eyes-no discharge Lungs-respiratory rate normal, CTA CV-no murmurs,RRR Extremities skin warm dry no edema Neuro grossly normal Behavior normal, alert        Assessment & Plan:  1. Frequent headaches (Primary) I suspect this could be due to sleep apnea.  I think she needs her evaluation for possible sleep apnea - CBC - Ambulatory referral to Neurology  2. Morbid obesity (HCC) Portion control regular physical activity  3. Sinus pressure Allergy medicine Flonase recommended  4. Snoring Evaluation for sleep apnea see per above  5. Other fatigue Patient has history of iron deficient anemia check lab work - CBC - Iron, TIBC and Ferritin Panel  6. Hyperglycemia Patient with hyperglycemia previously patient has had diabetes partly related to her morbid obesity recheck lab work - Hemoglobin A1c - Basic metabolic panel  7. Anxiety Patient has history of bipolar and feeling anxious she would like to be seen by psychiatry go ahead with  referral - Ambulatory referral to Psychology  8. Iron deficiency anemia, unspecified iron deficiency anemia type History of iron deficient anemia check lab work - CBC - Iron, TIBC and Ferritin Panel  9. Seasonal allergic rhinitis due to pollen Seasonal allergies Flonase Zyrtec avoid Benadryl  10. Family history of cancer Patient very concerned about cancer has multiple family members who have had cancer requesting referral to genetics I did inform the patient that she needs to do the best she can at chronic lysing all the family members regarding what type of cancer they had and at what age Her mother also had cancer - Ambulatory referral to Genetics  11. High risk medication use Labs - Hepatic function panel  12. Hyperlipidemia, unspecified hyperlipidemia type Healthy diet check labs - Lipid panel

## 2023-12-31 DIAGNOSIS — E785 Hyperlipidemia, unspecified: Secondary | ICD-10-CM | POA: Diagnosis not present

## 2023-12-31 DIAGNOSIS — R5383 Other fatigue: Secondary | ICD-10-CM | POA: Diagnosis not present

## 2023-12-31 DIAGNOSIS — R519 Headache, unspecified: Secondary | ICD-10-CM | POA: Diagnosis not present

## 2023-12-31 DIAGNOSIS — R739 Hyperglycemia, unspecified: Secondary | ICD-10-CM | POA: Diagnosis not present

## 2023-12-31 DIAGNOSIS — D509 Iron deficiency anemia, unspecified: Secondary | ICD-10-CM | POA: Diagnosis not present

## 2023-12-31 DIAGNOSIS — Z79899 Other long term (current) drug therapy: Secondary | ICD-10-CM | POA: Diagnosis not present

## 2024-01-01 ENCOUNTER — Encounter: Payer: Self-pay | Admitting: Family Medicine

## 2024-01-01 ENCOUNTER — Telehealth: Payer: Self-pay | Admitting: *Deleted

## 2024-01-01 LAB — BASIC METABOLIC PANEL WITH GFR
BUN/Creatinine Ratio: 11 (ref 9–23)
BUN: 7 mg/dL (ref 6–20)
CO2: 23 mmol/L (ref 20–29)
Calcium: 9.5 mg/dL (ref 8.7–10.2)
Chloride: 98 mmol/L (ref 96–106)
Creatinine, Ser: 0.65 mg/dL (ref 0.57–1.00)
Glucose: 125 mg/dL — ABNORMAL HIGH (ref 70–99)
Potassium: 4.7 mmol/L (ref 3.5–5.2)
Sodium: 136 mmol/L (ref 134–144)
eGFR: 119 mL/min/{1.73_m2} (ref >59)

## 2024-01-01 LAB — HEPATIC FUNCTION PANEL
ALT: 27 IU/L (ref 0–32)
AST: 20 IU/L (ref 0–40)
Albumin: 4.5 g/dL (ref 3.9–4.9)
Alkaline Phosphatase: 116 IU/L (ref 44–121)
Bilirubin Total: 0.9 mg/dL (ref 0.0–1.2)
Bilirubin, Direct: 0.26 mg/dL (ref 0.00–0.40)
Total Protein: 7.3 g/dL (ref 6.0–8.5)

## 2024-01-01 LAB — CBC
Hematocrit: 42.2 % (ref 34.0–46.6)
Hemoglobin: 14.1 g/dL (ref 11.1–15.9)
MCH: 29.3 pg (ref 26.6–33.0)
MCHC: 33.4 g/dL (ref 31.5–35.7)
MCV: 88 fL (ref 79–97)
Platelets: 307 10*3/uL (ref 150–450)
RBC: 4.82 x10E6/uL (ref 3.77–5.28)
RDW: 12.6 % (ref 11.7–15.4)
WBC: 7.9 10*3/uL (ref 3.4–10.8)

## 2024-01-01 LAB — LIPID PANEL
Chol/HDL Ratio: 4.8 ratio — ABNORMAL HIGH (ref 0.0–4.4)
Cholesterol, Total: 188 mg/dL (ref 100–199)
HDL: 39 mg/dL — ABNORMAL LOW (ref >39)
LDL Chol Calc (NIH): 117 mg/dL — ABNORMAL HIGH (ref 0–99)
Triglycerides: 179 mg/dL — ABNORMAL HIGH (ref 0–149)
VLDL Cholesterol Cal: 32 mg/dL (ref 5–40)

## 2024-01-01 LAB — IRON,TIBC AND FERRITIN PANEL
Ferritin: 55 ng/mL (ref 15–150)
Iron Saturation: 20 % (ref 15–55)
Iron: 72 ug/dL (ref 27–159)
Total Iron Binding Capacity: 359 ug/dL (ref 250–450)
UIBC: 287 ug/dL (ref 131–425)

## 2024-01-01 LAB — HEMOGLOBIN A1C
Est. average glucose Bld gHb Est-mCnc: 143 mg/dL
Hgb A1c MFr Bld: 6.6 % — ABNORMAL HIGH (ref 4.8–5.6)

## 2024-01-01 NOTE — Telephone Encounter (Signed)
 Copied from CRM [id number]. Topic: Referral - Question >> Jan 01, 2024  2:15 PM Yolanda T wrote: Reason for CRM: patient called requested to have referral transferred to [facility] in front of [facility] in [place]. Please f/u with patient

## 2024-01-04 ENCOUNTER — Other Ambulatory Visit: Payer: Self-pay | Admitting: Nurse Practitioner

## 2024-01-04 DIAGNOSIS — F319 Bipolar disorder, unspecified: Secondary | ICD-10-CM

## 2024-01-05 ENCOUNTER — Other Ambulatory Visit: Payer: Self-pay | Admitting: Family Medicine

## 2024-01-11 ENCOUNTER — Ambulatory Visit: Payer: MEDICAID | Admitting: Plastic Surgery

## 2024-01-13 ENCOUNTER — Encounter: Payer: Self-pay | Admitting: Family Medicine

## 2024-01-14 ENCOUNTER — Other Ambulatory Visit: Payer: Self-pay | Admitting: Nurse Practitioner

## 2024-01-14 DIAGNOSIS — G8929 Other chronic pain: Secondary | ICD-10-CM

## 2024-01-29 ENCOUNTER — Ambulatory Visit
Admission: EM | Admit: 2024-01-29 | Discharge: 2024-01-29 | Disposition: A | Discharge status: Home / Self Care | Payer: MEDICAID | Attending: Nurse Practitioner | Admitting: Nurse Practitioner

## 2024-01-29 ENCOUNTER — Encounter: Payer: Self-pay | Admitting: Family Medicine

## 2024-01-29 DIAGNOSIS — L03213 Periorbital cellulitis: Secondary | ICD-10-CM | POA: Diagnosis not present

## 2024-01-29 MED ORDER — SULFAMETHOXAZOLE-TRIMETHOPRIM 800-160 MG PO TABS
1.0000 | ORAL_TABLET | Freq: Two times a day (BID) | ORAL | 0 refills | Status: AC
Start: 2024-01-29 — End: 2024-02-05
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
## 2024-01-29 NOTE — ED Triage Notes (Addendum)
 Pt reports she has a pins and needle pains around her right eye, swollen right eye lid, and pain x 1 day  Pt reports visual disturbances in right eye

## 2024-01-29 NOTE — Discharge Instructions (Signed)
 May take over-the-counter Tylenol as needed for pain, fever, or general discomfort. Apply cool compresses to the eye to help with pain or swelling.  Apply warm compresses to help with pain or discomfort. Avoid rubbing or manipulating the eye while symptoms persist. Continue use of over-the-counter eyedrops to help keep the eye moist and lubricated. Follow-up with your eye doctor within the next 5 to 7 days if you continue to experience blurriness of the right eye. Seek care immediately if you experience increased redness, swelling, or develop fever, chills, or other concerns. Follow-up as needed.

## 2024-02-03 ENCOUNTER — Ambulatory Visit (INDEPENDENT_AMBULATORY_CARE_PROVIDER_SITE_OTHER): Payer: MEDICAID | Admitting: Plastic Surgery

## 2024-02-03 ENCOUNTER — Encounter: Payer: Self-pay | Admitting: Plastic Surgery

## 2024-02-03 VITALS — BP 115/69 | HR 88

## 2024-02-03 DIAGNOSIS — L989 Disorder of the skin and subcutaneous tissue, unspecified: Secondary | ICD-10-CM | POA: Diagnosis not present

## 2024-02-03 NOTE — Progress Notes (Signed)
    Procedure Note  Preoperative Dx: Multiple skin tags along the collar and under the chin  Postoperative Dx: Same  Procedure: Excision of 10 skin tags  Anesthesia: Lidocaine  1% with 1:100,000 epinephrine and 0.25% Sensorcaine    Indication for Procedure: Discomfort from close catching on skin tags  Description of Procedure: Risks and complications were explained to the patient including bleeding and recurrence.  Consent was confirmed and the patient understands the risks and benefits.  The potential complications and alternatives were explained and the patient consents.  The patient expressed understanding the option of not having the procedure and the risks of a scar.  Time out was called and all information was confirmed to be correct.    The area was prepped and drapped.  Local anesthetic was injected in the subcutaneous tissues.  After waiting for the local to take affect ten 1 mm skin tags were excised sharply with the scissors.  .  The surgical wound measured 1 mm for each of the skin tags.  A dressing was applied.  The patient was given instructions on how to care for the area and a follow up appointment.  Shannon Lin tolerated the procedure well and there were no complications.

## 2024-02-04 ENCOUNTER — Encounter (INDEPENDENT_AMBULATORY_CARE_PROVIDER_SITE_OTHER): Payer: Self-pay

## 2024-02-04 ENCOUNTER — Ambulatory Visit (INDEPENDENT_AMBULATORY_CARE_PROVIDER_SITE_OTHER): Payer: MEDICAID | Admitting: Gastroenterology

## 2024-02-11 ENCOUNTER — Encounter: Payer: Self-pay | Admitting: Plastic Surgery

## 2024-02-11 ENCOUNTER — Encounter (INDEPENDENT_AMBULATORY_CARE_PROVIDER_SITE_OTHER): Payer: Self-pay | Admitting: Gastroenterology

## 2024-02-11 ENCOUNTER — Ambulatory Visit (INDEPENDENT_AMBULATORY_CARE_PROVIDER_SITE_OTHER): Payer: MEDICAID | Admitting: Gastroenterology

## 2024-02-11 VITALS — BP 117/73 | HR 79 | Temp 97.8°F | Ht 61.0 in | Wt 206.3 lb

## 2024-02-11 DIAGNOSIS — K649 Unspecified hemorrhoids: Secondary | ICD-10-CM | POA: Diagnosis not present

## 2024-02-11 DIAGNOSIS — R1012 Left upper quadrant pain: Secondary | ICD-10-CM | POA: Diagnosis not present

## 2024-02-11 MED ORDER — NAPROXEN 500 MG PO TABS: 500.0000 mg | ORAL_TABLET | Freq: Two times a day (BID) | ORAL | 0 refills | Status: DC

## 2024-02-11 NOTE — Patient Instructions (Addendum)
 You can try using the otc hemorrhoid wipes. If this does not help, please let me know Make sure you are avoiding straining and limiting long periods on the toilet as this can worsen hemorrhoids  As discussed, is suspect your upper abdominal pain is a strained muscle from coughing, you can use some heat on this area, I will send naproxyn 500mg  to take twice daily, make sure you take this with food. Let me know if symptoms not improving  Follow up 6 months   It was a pleasure to see you today. I want to create trusting relationships with patients and provide genuine, compassionate, and quality care. I truly value your feedback! please be on the lookout for a survey regarding your visit with me today. I appreciate your input about our visit and your time in completing this!    Shannon Vitiello L. Ambrosio Reuter, MSN, APRN, AGNP-C Adult-Gerontology Nurse Practitioner [facility] at [facility]
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
## 2024-02-15 ENCOUNTER — Inpatient Hospital Stay: Payer: MEDICAID

## 2024-02-15 ENCOUNTER — Inpatient Hospital Stay: Payer: MEDICAID | Attending: Internal Medicine | Admitting: Genetic Counselor

## 2024-02-15 DIAGNOSIS — Z800 Family history of malignant neoplasm of digestive organs: Secondary | ICD-10-CM | POA: Diagnosis not present

## 2024-02-15 DIAGNOSIS — Z1379 Encounter for other screening for genetic and chromosomal anomalies: Secondary | ICD-10-CM

## 2024-02-15 LAB — GENETIC SCREENING ORDER
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
## 2024-02-16 ENCOUNTER — Encounter (INDEPENDENT_AMBULATORY_CARE_PROVIDER_SITE_OTHER): Payer: Self-pay

## 2024-02-16 ENCOUNTER — Encounter: Payer: Self-pay | Admitting: Genetic Counselor

## 2024-02-17 ENCOUNTER — Ambulatory Visit: Payer: MEDICAID | Admitting: Surgical

## 2024-02-17 ENCOUNTER — Other Ambulatory Visit (INDEPENDENT_AMBULATORY_CARE_PROVIDER_SITE_OTHER): Payer: Self-pay

## 2024-02-17 DIAGNOSIS — R110 Nausea: Secondary | ICD-10-CM

## 2024-02-17 DIAGNOSIS — R197 Diarrhea, unspecified: Secondary | ICD-10-CM

## 2024-02-17 MED ORDER — ONDANSETRON 4 MG PO TBDP: 4.0000 mg | ORAL_TABLET | Freq: Three times a day (TID) | ORAL | 0 refills | Status: DC | PRN

## 2024-03-04 ENCOUNTER — Ambulatory Visit: Payer: Self-pay | Admitting: Genetic Counselor

## 2024-03-04 ENCOUNTER — Encounter: Payer: Self-pay | Admitting: Genetic Counselor

## 2024-03-07 ENCOUNTER — Encounter: Payer: Self-pay | Admitting: Family Medicine

## 2024-03-07 ENCOUNTER — Ambulatory Visit: Payer: Self-pay | Admitting: Genetic Counselor

## 2024-03-07 ENCOUNTER — Encounter: Payer: Self-pay | Admitting: Genetic Counselor

## 2024-03-07 DIAGNOSIS — Z1379 Encounter for other screening for genetic and chromosomal anomalies: Secondary | ICD-10-CM | POA: Insufficient documentation

## 2024-03-07 DIAGNOSIS — Z800 Family history of malignant neoplasm of digestive organs: Secondary | ICD-10-CM

## 2024-03-08 ENCOUNTER — Encounter: Payer: Self-pay | Admitting: General Surgery

## 2024-03-08 ENCOUNTER — Ambulatory Visit (INDEPENDENT_AMBULATORY_CARE_PROVIDER_SITE_OTHER): Payer: MEDICAID | Admitting: General Surgery

## 2024-03-08 VITALS — BP 114/80 | HR 93 | Temp 98.8°F | Resp 16 | Ht 61.0 in | Wt 207.0 lb

## 2024-03-08 DIAGNOSIS — K641 Second degree hemorrhoids: Secondary | ICD-10-CM | POA: Diagnosis not present

## 2024-03-08 DIAGNOSIS — K6289 Other specified diseases of anus and rectum: Secondary | ICD-10-CM

## 2024-03-08 NOTE — Patient Instructions (Addendum)
 Call with issues. Keep stool regular and soft. Will follow up in September to get you on the schedule for surgery.

## 2024-03-08 NOTE — Progress Notes (Unsigned)
 [facility] Note   HPI:  33 y.o. Female presents to clinic for follow-up evaluation of her hemorrhoids. Patient reports some issues at times. Her stomach is better with bentyl  and pain improved. She is regular and having some minor pain on occasion.   Review of Systems:  Anal pain at times  All other review of systems: otherwise negative   Vital Signs:  BP 114/80   Pulse 93   Temp 98.8 F (37.1 C) (Oral)   Resp 16   Ht 5\' 1"  (1.549 m)   Wt 207 lb (93.9 kg)   SpO2 95%   BMI 39.11 kg/m    Physical Exam:  Physical Exam Vitals reviewed.  Cardiovascular:     Rate and Rhythm: Normal rate.  Pulmonary:     Effort: Pulmonary effort is normal.  Genitourinary:    Comments: Right anterior hemorrhoid prolapsing with extract tissue on the right side      Assessment:  33 y.o. yo Female with some residual hemorrhoid tissue after extensive hemorrhoidectomy in 2023. She had a posterior looking right hemorrhoid last time now it looks more anterior. She likely has more tissue in both these columns that I could not get safely at there last procedure or has recurred. She is better now with her Bms and abdominal pain.  Plan:  Call with issues. Keep stool regular and soft. Will follow up in September to get you on the schedule for surgery given summer/ vacations etc   All of the above recommendations were discussed with the patient and patient's family, and all of patient's and family's questions were answered to their expressed satisfaction.  Deena Farrier, MD [facility] [address] [place] [place], [place] [address] [phone] (office)

## 2024-03-09 NOTE — Progress Notes (Signed)
 HPI:   Ms. Jaye was previously seen in the [facility] due to a family history of pancreatic and other cancers and concerns regarding a hereditary predisposition to cancer.    Ms. Robidoux recent genetic test results were disclosed to her by MyChart message and telephone. These results and recommendations are discussed in more detail below.  CANCER HISTORY:  Oncology History   No history exists.   FAMILY HISTORY:  We obtained a detailed, 4-generation family history.  Significant diagnoses are listed below:      Family History  Problem Relation Age of Onset   Cancer Mother 44        vulva   Seizures Sister     Infertility Sister     Pancreatic cancer Maternal Grandmother          d. 70   Throat cancer Maternal Grandmother     Cancer Paternal Grandmother          unknown type; ? lung   Stroke Other          paternal great grandma   Cancer Other          unknown cancers in MGM's sister and brother   Kidney cancer Maternal Great-grandmother          MGM's mother; dx >62       Ms. Herd is unaware of previous family history of genetic testing for hereditary cancer risks.  Other relatives are unavailable for genetic testing at this time.    There is no known Ashkenazi Jewish ancestry.  GENETIC TEST RESULTS:  The [facility] CancerNext-Expanded +RNAinsight Panel found no pathogenic mutations.   The CancerNext-Expanded gene panel offered by [facility] and includes sequencing, rearrangement, and RNA analysis for the following 77 genes: AIP, ALK, APC, ATM, AXIN2, BAP1, BARD1, BMPR1A, BRCA1, BRCA2, BRIP1, CDC73, CDH1, CDK4, CDKN1B, CDKN2A, CEBPA, CHEK2, CTNNA1, DDX41, DICER1, ETV6, FH, FLCN, GATA2, LZTR1, MAX, MBD4, MEN1, MET, MLH1, MSH2, MSH3, MSH6, MUTYH, NF1, NF2, NTHL1, PALB2, PHOX2B, PMS2, POT1, PRKAR1A, PTCH1, PTEN, RAD51C, RAD51D, RB1, RET, RUNX1, RSP20, SDHA, SDHAF2, SDHB, SDHC, SDHD, SMAD4, SMARCA4, SMARCB1, SMARCE1, STK11, SUFU, TMEM127, TP53, TSC1, TSC2,  VHL, and WT1 (sequencing and deletion/duplication); EGFR, HOXB13, KIT, MITF, PDGFRA, POLD1, and POLE (sequencing only); EPCAM and GREM1 (deletion/duplication only).    The test report has been scanned into EPIC and is located under the Molecular Pathology section of the Results Review tab.  A portion of the result report is included below for reference. Genetic testing reported out on 02/25/2024.      Even though a pathogenic variant was not identified, possible explanations for the cancer in the family may include: There may be no hereditary risk for cancer in the family. The cancers in Ms. Thorpe's family may be sporadic/familial or due to other genetic and environmental factors.  Most cancer is not hereditary.  There may be a gene mutation in one of these genes that current testing methods cannot detect but that chance is small. There could be another gene that has not yet been discovered, or that we have not yet tested, that is responsible for the cancer diagnoses in the family.  It is also possible there is a hereditary cause for the cancer in the family that Ms. Caldera did not inherit.   Therefore, it is important to remain in touch with cancer genetics in the future so that we can continue to offer Ms. Brugger the most up to date genetic testing.    ADDITIONAL  GENETIC TESTING:   Ms. Whelpley genetic testing was fairly extensive.  If there are additional relevant genes identified to increase cancer risk that can be analyzed in the future, we would be happy to discuss and coordinate this testing at that time.    CANCER SCREENING RECOMMENDATIONS:  Ms. Porcher test result is considered negative (normal).  This means that we have not identified a hereditary cause for her family history of cancer at this time.   An individual's cancer risk and medical management are not determined by genetic test results alone. Overall cancer risk assessment incorporates additional factors, including personal  medical history, family history, and any available genetic information that may result in a personalized plan for cancer prevention and surveillance. Therefore, it is recommended she continue to follow the cancer management and screening guidelines provided by her primary healthcare provider.  RECOMMENDATIONS FOR FAMILY MEMBERS:   Since she did not inherit a identifiable mutation in a cancer predisposition gene included on this panel, potential future children cannot inherited a known mutation from her in one of these genes. Individuals in this family might be at some increased risk of developing cancer, over the general population risk, due to the family history of cancer.  Individuals in the family should notify their providers of the family history of cancer.  Other members of the family may still carry a pathogenic variant in one of these genes that Ms. Ramaker did not inherit. Based on the family history, we recommend first degree relatives of her maternal grandmother (who had pancreatic cancer), like Ms. Flaten's mother, have genetic counseling and testing. Ms. Teall can let us  know if we can be of any assistance in coordinating genetic counseling and/or testing for these family members.     FOLLOW-UP:   Of note, Ms. Wickware expressed wanting to know about chances her potential future children would have epilepsy.  We discussed that general genetics evaluation and/or prenatal/preconception genetic counseling is available if she wishes to explore this further.   Cancer genetics is a rapidly advancing field and it is possible that new genetic tests will be appropriate for her and/or her family members in the future. We encourage Ms. Countess to remain in contact with cancer genetics, so we can update her personal and family histories and let her know of advances in cancer genetics that may benefit this family.   Our contact number was provided.  They are welcome to call us  at anytime with additional  questions or concerns.   Ripken Rekowski M. Ora Billing, MS, [facility] Genetic Counselor [email or web address] (P) [phone]

## 2024-03-29 ENCOUNTER — Ambulatory Visit (INDEPENDENT_AMBULATORY_CARE_PROVIDER_SITE_OTHER): Payer: MEDICAID | Admitting: General Surgery

## 2024-03-29 ENCOUNTER — Other Ambulatory Visit: Payer: Self-pay | Admitting: *Deleted

## 2024-03-29 ENCOUNTER — Encounter: Payer: Self-pay | Admitting: General Surgery

## 2024-03-29 VITALS — BP 107/69 | HR 82 | Temp 98.2°F | Resp 16 | Ht 61.0 in | Wt 206.0 lb

## 2024-03-29 DIAGNOSIS — K641 Second degree hemorrhoids: Secondary | ICD-10-CM | POA: Diagnosis not present

## 2024-03-29 MED ORDER — DICYCLOMINE HCL 10 MG PO CAPS: 10.0000 mg | ORAL_CAPSULE | Freq: Three times a day (TID) | ORAL | 1 refills | Status: DC

## 2024-03-29 NOTE — Telephone Encounter (Signed)
Patient requested refill of Bentyl.

## 2024-03-29 NOTE — Patient Instructions (Signed)
 Surgical Procedures for Hemorrhoids Surgery can be used to treat hemorrhoids. Hemorrhoids are swollen veins in and around the rectum or the opening of the butt (anus). There are two types: Internal. These occur in the veins just inside the rectum. They may poke through to the outside. External. These occur in the veins outside the anus. They can be felt as a swelling or hard lump near the anus. Hemorrhoids can cause bleeding and pain. They can often be treated at home. If home treatments do not help, you may need to have surgery. Types of surgery include: Closed hemorrhoidectomy. Open hemorrhoidectomy. Stapled hemorrhoidectomy. Tell a health care provider about: Any allergies you have. All medicines you are taking, including vitamins, herbs, eye drops, creams, and over-the-counter medicines. Any problems you or family members have had with anesthesia. Any bleeding problems you have. Any surgeries you have had. Any medical conditions you have. Whether you are pregnant or may be pregnant. What are the risks? Your health care provider will talk with you about risks. These may include: Pain and bleeding. You may also have a hematoma. This is a collection of blood in the area where surgery was done. Constipation. You may also have fecal impaction. This is when poop gets trapped in the anal canal. Trouble peeing (urinating) or the inability to control when you pee or poop (incontinence). Stenosis. This is narrowing of the anal canal. Getting hemorrhoids again. A new passage (fistula) forming between the anus or rectum and somewhere else in the body. Rectal prolapse. This happens when the lining of the rectum slips out of the anus. What happens before the surgery? Medicines Ask your provider about: Changing or stopping your regular medicines. These include any diabetes medicines or blood thinners you take. Taking medicines such as aspirin and ibuprofen. These medicines can thin your blood. Do  not take them unless your provider tells you to. Taking over-the-counter medicines, vitamins, herbs, and supplements. Surgery safety Ask your provider: What steps will be taken to help prevent infection. These steps may include: Washing skin with a soap that kills germs. Taking antibiotics. General instructions You may be told to take a medicine that helps you poop (laxative) and an enema. This is done to clean out your colon before surgery (bowel prep). If you will be going home right after the procedure, plan to have a responsible adult: Take you home from the hospital or clinic. You will not be allowed to drive. Care for you for the time you are told. What happens during the surgery? An IV will be inserted into one of your veins. You will be given: A sedative. This helps you relax. Anesthesia. This keeps you from feeling pain. It will make you fall asleep for surgery. A jelly may be put into your rectum. A short tube (anoscope) will be put into your rectum to look at the hemorrhoids. If you have an internal hemorrhoid, you may need a closed hemorrhoidectomy. For this surgery: Tools will be used to open the tissue around the hemorrhoids. The blood supply to the hemorrhoids will be tied off. The hemorrhoids will be taken out. The tissue around the affected area will be closed with stitches (sutures) that your body can absorb. If you have an open hemorrhoidectomy: Tools will be used to remove the hemorrhoids. The incisions will be left open to heal without sutures. If you have hemorrhoids that stick out of your anus, you may need a stapled hemorrhoidectomy. For this surgery: A stapling device will be used  to partly remove the hemorrhoids. The device will be put into your anus. It will take out a ring of tissue. This tissue will come from the hemorrhoids and from right above the hemorrhoids. The staples in the device will close the edges of the tissue. This will cut off the blood supply  to any bits of the hemorrhoids that are left. It will also pull the tissue back into place. The surgeries may vary among providers and hospitals. What happens after the surgery? Your blood pressure, heart rate, breathing rate, and blood oxygen level may be monitored until you leave the hospital or clinic. You will be given pain medicine as needed. This information is not intended to replace advice given to you by your health care provider. Make sure you discuss any questions you have with your health care provider. Document Revised: 05/16/2022 Document Reviewed: 05/16/2022 Elsevier Patient Education  2024 [employer].

## 2024-03-29 NOTE — Addendum Note (Signed)
 Addended by: SAUNDRA TAWNI DEL on: 03/29/2024 10:40 AM   Modules accepted: Orders
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
## 2024-04-05 NOTE — Patient Instructions (Signed)
 Shannon Lin  04/05/2024     @PREFPERIOPPHARMACY @    Your procedure is scheduled on 04/11/2024.   [facility] to [name] at 7:10 A.M.   Call this number if you have problems the morning of surgery:   [phone]  If you experience any cold or flu symptoms such as cough, fever, chills, shortness of breath, etc. between now and your scheduled surgery, please notify us  at the above number.   Remember:   Do not eat or drink after midnight.      Take these medicines the morning of surgery with A SIP OF WATER  : Abilify  Zyrtec  Flonase  protonix  Inderal  and Zofran  if needed    Do not take any diabetic meds the am of the procedure.     Do not wear jewelry, make-up or nail polish, including gel polish,  artificial nails, or any other type of covering on natural nails (fingers and  toes).  Do not wear lotions, powders, or perfumes, or deodorant.  Do not shave 48 hours prior to surgery.  Men may shave face and neck.  Do not bring valuables to the hospital.  [facility] is not responsible for any belongings or valuables.  Contacts, dentures or bridgework may not be worn into surgery.  Leave your suitcase in the car.  After surgery it may be brought to your room.  For patients admitted to the hospital, discharge time will be determined by your treatment team.  Patients discharged the day of surgery will not be allowed to drive home.   Name and phone number of your driver:   Family  Special instructions:  N/A  Please read over the following fact sheets that you were given. Care and Recovery After Surgery  Surgical Procedures for Hemorrhoids, Care After After surgery for hemorrhoids, it is common to have: Pain in your rectum for 2-4 weeks after the procedure, and may take 1-2 months to fully recover. Pain when you are pooping. A small amount of bleeding from your rectum. This is more likely to happen the first time you poop after surgery. Follow these instructions at  home: Medicines Take over-the-counter and prescription medicines only as told by your health care provider. If you were prescribed antibiotics, use them as told by your provider. Do not stop using the antibiotic even if you start to feel better. Ask your provider if the medicine prescribed to you requires you to avoid driving or using machinery. Use a stool softener or a medicine that helps you poop (laxative) as told by your provider. Eating and drinking Follow instructions from your provider about what you may eat and drink after the surgery. You may need to take these actions to prevent or treat constipation: Drink enough fluid to keep your pee (urine) pale yellow. Take over-the-counter or prescription medicines. Eat foods that are high in fiber, such as beans, whole grains, and fresh fruits and vegetables. Limit foods that are high in fat and processed sugars, such as fried or sweet foods. Managing pain and swelling  Take warm sitz baths for 15-20 minutes, 2-3 times a day to ease soreness and itching. This will also help keep the rectal area clean. If told, put ice on the affected area. It may help to use ice packs between sitz baths. Put ice in a plastic bag. Place a towel between your skin and the bag. Leave the ice on for 20 minutes, 2-3 times a day. If your skin turns bright red, remove the ice right away  to prevent skin damage. The risk of damage is higher if you cannot feel pain, heat, or cold. Activity Rest as told by your provider. Do not sit for a long time without moving. Get up to take short walks every 1-2 hours. This will improve blood flow and breathing. Ask for help if you feel weak or unsteady. You may have to avoid lifting. Ask your provider how much you can safely lift. Return to your normal activities as told by your provider. Ask your provider what activities are safe for you. General instructions Do not strain to poop. Do not spend a long time sitting on the  toilet. If you were given a sedative during the surgery, it can affect you for several hours. Do not drive or operate machinery until your provider says that it is safe. Your provider may give you more instructions. Make sure you know what you can and cannot do. Contact a health care provider if: Your pain medicine is not helping. You have a fever or chills. You have drainage that smells bad. You have a lot of swelling. You become constipated. You have trouble peeing (urinating). You have very bad pain in your rectum. Get help right away if: You are bleeding a lot from your rectum. This information is not intended to replace advice given to you by your health care provider. Make sure you discuss any questions you have with your health care provider. Document Revised: 05/16/2022 Document Reviewed: 05/16/2022 Elsevier Patient Education  2024 [employer]     General Anesthesia, Adult General anesthesia is the use of medicine to make you fall asleep (unconscious) for a medical procedure. General anesthesia must be used for certain procedures. It is often recommended for surgery or procedures that: Last a long time. Require you to be still or in an unusual position. Are major and can cause blood loss. Affect your breathing. The medicines used for general anesthesia are called general anesthetics. During general anesthesia, these medicines are given along with medicines that: Prevent pain. Control your blood pressure. Relax your muscles. Prevent nausea and vomiting after the procedure. Tell a health care provider about: Any allergies you have. All medicines you are taking, including vitamins, herbs, eye drops, creams, and over-the-counter medicines. Your history of any: Medical conditions you have, including: High blood pressure. Bleeding problems. Diabetes. Heart or lung conditions, such as: Heart failure. Sleep apnea. Asthma. Chronic obstructive pulmonary disease  (COPD). Current or recent illnesses, such as: Upper respiratory, chest, or ear infections. Cough or fever. Tobacco or drug use, including marijuana or alcohol use. Depression or anxiety. Surgeries and types of anesthetics you have had. Problems you or family members have had with anesthetic medicines. Whether you are pregnant or may be pregnant. Whether you have any chipped or loose teeth, dentures, caps, bridgework, or issues with your mouth, swallowing, or choking. What are the risks? Your health care provider will talk with you about risks. These may include: Allergic reaction to the medicines. Lung and heart problems. Inhaling food or liquid from the stomach into the lungs (aspiration). Nerve injury. Injury to the lips, mouth, teeth, or gums. Stroke. Waking up during your procedure and being unable to move. This is rare. These problems are more likely to develop if you are having a major surgery or if you have an advanced or serious medical condition. You can prevent some of these complications by answering all of your health care provider's questions thoroughly and by following all instructions before your procedure. General  anesthesia can cause side effects, including: Nausea or vomiting. A sore throat or hoarseness from the breathing tube. Wheezing or coughing. Shaking chills or feeling cold. Body aches. Sleepiness. Confusion, agitation (delirium), or anxiety. What happens before the procedure? When to stop eating and drinking Follow instructions from your health care provider about what you may eat and drink before your procedure. If you do not follow your health care provider's instructions, your procedure may be delayed or canceled. Medicines Ask your health care provider about: Changing or stopping your regular medicines. These include any diabetes medicines or blood thinners you take. Taking medicines such as aspirin and ibuprofen . These medicines can thin your blood.  Do not take them unless your health care provider tells you to. Taking over-the-counter medicines, vitamins, herbs, and supplements. General instructions Do not use any products that contain nicotine or tobacco for at least 4 weeks before the procedure. These products include cigarettes, chewing tobacco, and vaping devices, such as e-cigarettes. If you need help quitting, ask your health care provider. If you brush your teeth on the morning of the procedure, make sure to spit out all of the water  and toothpaste. If told by your health care provider, bring your sleep apnea device with you to surgery (if applicable). If you will be going home right after the procedure, plan to have a responsible adult: Take you home from the hospital or clinic. You will not be allowed to drive. Care for you for the time you are told. What happens during the procedure?  An IV will be inserted into one of your veins. You will be given one or more of the following through a face mask or IV: A sedative. This helps you relax. Anesthesia. This will: Numb certain areas of your body. Make you fall asleep for surgery. After you are unconscious, a breathing tube may be inserted down your throat to help you breathe. This will be removed before you wake up. An anesthesia provider, such as an anesthesiologist, will stay with you throughout your procedure. The anesthesia provider will: Keep you comfortable and safe by continuing to give you medicines and adjusting the amount of medicine that you get. Monitor your blood pressure, heart rate, and oxygen levels to make sure that the anesthetics do not cause any problems. The procedure may vary among health care providers and hospitals. What happens after the procedure? Your blood pressure, temperature, heart rate, breathing rate, and blood oxygen level will be monitored until you leave the hospital or clinic. You will wake up in a recovery area. You may wake up slowly. You may  be given medicine to help you with pain, nausea, or any other side effects from the anesthesia. Summary General anesthesia is the use of medicine to make you fall asleep (unconscious) for a medical procedure. Follow your health care provider's instructions about when to stop eating, drinking, or taking certain medicines before your procedure. Plan to have a responsible adult take you home from the hospital or clinic. This information is not intended to replace advice given to you by your health care provider. Make sure you discuss any questions you have with your health care provider. Document Revised: 12/19/2021 Document Reviewed: 12/19/2021 Elsevier Patient Education  2024 [employer].

## 2024-04-06 ENCOUNTER — Encounter (HOSPITAL_COMMUNITY): Payer: Self-pay

## 2024-04-06 ENCOUNTER — Other Ambulatory Visit: Payer: Self-pay

## 2024-04-06 ENCOUNTER — Encounter (HOSPITAL_COMMUNITY)
Admission: RE | Admit: 2024-04-06 | Discharge: 2024-04-06 | Disposition: A | Discharge status: Home / Self Care | Payer: MEDICAID | Source: Ambulatory Visit | Attending: General Surgery | Admitting: General Surgery

## 2024-04-06 VITALS — BP 156/84 | HR 88 | Temp 98.7°F | Resp 16 | Ht 61.0 in | Wt 206.0 lb

## 2024-04-06 DIAGNOSIS — D500 Iron deficiency anemia secondary to blood loss (chronic): Secondary | ICD-10-CM | POA: Insufficient documentation

## 2024-04-06 DIAGNOSIS — I10 Essential (primary) hypertension: Secondary | ICD-10-CM | POA: Insufficient documentation

## 2024-04-06 DIAGNOSIS — R7303 Prediabetes: Secondary | ICD-10-CM | POA: Insufficient documentation

## 2024-04-06 DIAGNOSIS — Z01818 Encounter for other preprocedural examination: Secondary | ICD-10-CM | POA: Insufficient documentation

## 2024-04-06 HISTORY — DX: Nausea with vomiting, unspecified: R11.2

## 2024-04-06 HISTORY — DX: Other specified postprocedural states: Z98.890

## 2024-04-06 LAB — HEMOGLOBIN A1C
Hgb A1c MFr Bld: 6.5 % — ABNORMAL HIGH (ref 4.8–5.6)
Mean Plasma Glucose: 139.85 mg/dL

## 2024-04-06 LAB — CBC WITH DIFFERENTIAL/PLATELET
Abs Immature Granulocytes: 0.02 10*3/uL (ref 0.00–0.07)
Basophils Absolute: 0 10*3/uL (ref 0.0–0.1)
Basophils Relative: 0 %
Eosinophils Absolute: 0.1 10*3/uL (ref 0.0–0.5)
Eosinophils Relative: 1 %
HCT: 37.9 % (ref 36.0–46.0)
Hemoglobin: 13.2 g/dL (ref 12.0–15.0)
Immature Granulocytes: 0 %
Lymphocytes Relative: 27 %
Lymphs Abs: 2.1 10*3/uL (ref 0.7–4.0)
MCH: 30.3 pg (ref 26.0–34.0)
MCHC: 34.8 g/dL (ref 30.0–36.0)
MCV: 86.9 fL (ref 80.0–100.0)
Monocytes Absolute: 0.4 10*3/uL (ref 0.1–1.0)
Monocytes Relative: 5 %
Neutro Abs: 5 10*3/uL (ref 1.7–7.7)
Neutrophils Relative %: 67 %
Platelets: 279 10*3/uL (ref 150–400)
RBC: 4.36 MIL/uL (ref 3.87–5.11)
RDW: 12.9 % (ref 11.5–15.5)
WBC: 7.6 10*3/uL (ref 4.0–10.5)
nRBC: 0 % (ref 0.0–0.2)

## 2024-04-06 LAB — BASIC METABOLIC PANEL WITH GFR
Anion gap: 11 (ref 5–15)
BUN: 7 mg/dL (ref 6–20)
CO2: 22 mmol/L (ref 22–32)
Calcium: 8.9 mg/dL (ref 8.9–10.3)
Chloride: 103 mmol/L (ref 98–111)
Creatinine, Ser: 0.55 mg/dL (ref 0.44–1.00)
GFR, Estimated: >60 mL/min (ref >60)
Glucose, Bld: 168 mg/dL — ABNORMAL HIGH (ref 70–99)
Potassium: 3.9 mmol/L (ref 3.5–5.1)
Sodium: 136 mmol/L (ref 135–145)

## 2024-04-06 LAB — PREGNANCY, URINE: Preg Test, Ur: NEGATIVE
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
## 2024-04-11 ENCOUNTER — Encounter (HOSPITAL_COMMUNITY)
Admission: RE | Disposition: A | Discharge status: Home / Self Care | Payer: Self-pay | Source: Home / Self Care | Attending: General Surgery

## 2024-04-11 ENCOUNTER — Ambulatory Visit (HOSPITAL_COMMUNITY): Payer: MEDICAID | Admitting: Anesthesiology

## 2024-04-11 ENCOUNTER — Encounter (HOSPITAL_COMMUNITY): Payer: Self-pay | Admitting: General Surgery

## 2024-04-11 ENCOUNTER — Other Ambulatory Visit: Payer: Self-pay

## 2024-04-11 ENCOUNTER — Ambulatory Visit (HOSPITAL_COMMUNITY)
Admission: RE | Admit: 2024-04-11 | Discharge: 2024-04-11 | Disposition: A | Discharge status: Home / Self Care | Payer: MEDICAID | Attending: General Surgery | Admitting: General Surgery

## 2024-04-11 ENCOUNTER — Ambulatory Visit (HOSPITAL_BASED_OUTPATIENT_CLINIC_OR_DEPARTMENT_OTHER): Payer: MEDICAID | Admitting: Anesthesiology

## 2024-04-11 DIAGNOSIS — K642 Third degree hemorrhoids: Secondary | ICD-10-CM

## 2024-04-11 DIAGNOSIS — K219 Gastro-esophageal reflux disease without esophagitis: Secondary | ICD-10-CM | POA: Insufficient documentation

## 2024-04-11 DIAGNOSIS — K644 Residual hemorrhoidal skin tags: Secondary | ICD-10-CM | POA: Insufficient documentation

## 2024-04-11 DIAGNOSIS — K641 Second degree hemorrhoids: Secondary | ICD-10-CM | POA: Diagnosis not present

## 2024-04-11 DIAGNOSIS — R110 Nausea: Secondary | ICD-10-CM

## 2024-04-11 DIAGNOSIS — Z6837 Body mass index (BMI) 37.0-37.9, adult: Secondary | ICD-10-CM

## 2024-04-11 DIAGNOSIS — R197 Diarrhea, unspecified: Secondary | ICD-10-CM

## 2024-04-11 HISTORY — PX: HEMORRHOID SURGERY: SHX153

## 2024-04-11 LAB — GLUCOSE, CAPILLARY: Glucose-Capillary: 144 mg/dL — ABNORMAL HIGH (ref 70–99)

## 2024-04-11 LAB — POCT PREGNANCY, URINE: Preg Test, Ur: NEGATIVE

## 2024-04-11 SURGERY — HEMORRHOIDECTOMY
Anesthesia: General | Site: Rectum

## 2024-04-11 MED ORDER — LIDOCAINE 2% (20 MG/ML) 5 ML SYRINGE
INTRAMUSCULAR | Status: AC
  Filled 2024-04-11: qty 5

## 2024-04-11 MED ORDER — CHLORHEXIDINE GLUCONATE CLOTH 2 % EX PADS: 6.0000 | MEDICATED_PAD | Freq: Once | CUTANEOUS | Status: DC

## 2024-04-11 MED ORDER — PROPOFOL 10 MG/ML IV BOLUS
INTRAVENOUS | Status: DC | PRN
Start: 2024-04-11 — End: 2024-04-11
  Administered 2024-04-11: 200 mg via INTRAVENOUS

## 2024-04-11 MED ORDER — FENTANYL CITRATE PF 50 MCG/ML IJ SOSY: 25.0000 ug | PREFILLED_SYRINGE | INTRAMUSCULAR | Status: DC | PRN

## 2024-04-11 MED ORDER — BUPIVACAINE HCL (PF) 0.5 % IJ SOLN
INTRAMUSCULAR | Status: AC
  Filled 2024-04-11: qty 30

## 2024-04-11 MED ORDER — DEXAMETHASONE SODIUM PHOSPHATE 10 MG/ML IJ SOLN
INTRAMUSCULAR | Status: AC
  Filled 2024-04-11: qty 1

## 2024-04-11 MED ORDER — DEXAMETHASONE SODIUM PHOSPHATE 10 MG/ML IJ SOLN
INTRAMUSCULAR | Status: DC | PRN
  Administered 2024-04-11: 8 mg via INTRAVENOUS

## 2024-04-11 MED ORDER — LIDOCAINE VISCOUS HCL 2 % MT SOLN
OROMUCOSAL | Status: AC
  Filled 2024-04-11: qty 15

## 2024-04-11 MED ORDER — LACTATED RINGERS IV SOLN: INTRAVENOUS | Status: DC | PRN

## 2024-04-11 MED ORDER — SODIUM CHLORIDE 0.9 % IR SOLN
Status: DC | PRN
  Administered 2024-04-11: 1000 mL

## 2024-04-11 MED ORDER — ONDANSETRON HCL 4 MG/2ML IJ SOLN: 4.0000 mg | Freq: Once | INTRAMUSCULAR | Status: DC | PRN

## 2024-04-11 MED ORDER — BUPIVACAINE HCL (PF) 0.5 % IJ SOLN
INTRAMUSCULAR | Status: DC | PRN
Start: 2024-04-11 — End: 2024-04-11
  Administered 2024-04-11: 30 mL

## 2024-04-11 MED ORDER — ONDANSETRON HCL 4 MG/2ML IJ SOLN
INTRAMUSCULAR | Status: DC | PRN
  Administered 2024-04-11: 4 mg via INTRAVENOUS

## 2024-04-11 MED ORDER — DEXMEDETOMIDINE HCL IN NACL 80 MCG/20ML IV SOLN
INTRAVENOUS | Status: AC
  Filled 2024-04-11: qty 20

## 2024-04-11 MED ORDER — ONDANSETRON 4 MG PO TBDP: 4.0000 mg | ORAL_TABLET | Freq: Three times a day (TID) | ORAL | 0 refills | Status: DC | PRN

## 2024-04-11 MED ORDER — LIDOCAINE 2% (20 MG/ML) 5 ML SYRINGE
INTRAMUSCULAR | Status: DC | PRN
  Administered 2024-04-11: 100 mg via INTRAVENOUS

## 2024-04-11 MED ORDER — CHLORHEXIDINE GLUCONATE 0.12 % MT SOLN
15.0000 mL | Freq: Once | OROMUCOSAL | Status: AC
  Administered 2024-04-11: 15 mL via OROMUCOSAL

## 2024-04-11 MED ORDER — ONDANSETRON HCL 4 MG/2ML IJ SOLN
INTRAMUSCULAR | Status: AC
Start: 2024-04-11 — End: 2024-04-11
  Filled 2024-04-11: qty 2

## 2024-04-11 MED ORDER — FENTANYL CITRATE (PF) 100 MCG/2ML IJ SOLN
INTRAMUSCULAR | Status: DC | PRN
  Administered 2024-04-11 (×2): 50 ug via INTRAVENOUS

## 2024-04-11 MED ORDER — LIDOCAINE VISCOUS HCL 2 % MT SOLN
OROMUCOSAL | Status: DC | PRN
  Administered 2024-04-11: 1 via OROMUCOSAL

## 2024-04-11 MED ORDER — ROCURONIUM BROMIDE 10 MG/ML (PF) SYRINGE
PREFILLED_SYRINGE | INTRAVENOUS | Status: AC
Start: 2024-04-11 — End: 2024-04-11
  Filled 2024-04-11: qty 10

## 2024-04-11 MED ORDER — MIDAZOLAM HCL 2 MG/2ML IJ SOLN
INTRAMUSCULAR | Status: DC | PRN
  Administered 2024-04-11: 2 mg via INTRAVENOUS

## 2024-04-11 MED ORDER — SODIUM CHLORIDE 0.9 % IV SOLN
2.0000 g | INTRAVENOUS | Status: AC
  Administered 2024-04-11: 2 g via INTRAVENOUS
  Filled 2024-04-11: qty 2

## 2024-04-11 MED ORDER — KETAMINE HCL 50 MG/5ML IJ SOSY
PREFILLED_SYRINGE | INTRAMUSCULAR | Status: AC
  Filled 2024-04-11: qty 5

## 2024-04-11 MED ORDER — MIDAZOLAM HCL 2 MG/2ML IJ SOLN
INTRAMUSCULAR | Status: AC
  Filled 2024-04-11: qty 2

## 2024-04-11 MED ORDER — OXYCODONE HCL 5 MG PO TABS: 5.0000 mg | ORAL_TABLET | Freq: Once | ORAL | Status: DC | PRN

## 2024-04-11 MED ORDER — FENTANYL CITRATE (PF) 100 MCG/2ML IJ SOLN
INTRAMUSCULAR | Status: AC
  Filled 2024-04-11: qty 2

## 2024-04-11 MED ORDER — PROPOFOL 500 MG/50ML IV EMUL
INTRAVENOUS | Status: DC | PRN
  Administered 2024-04-11: 75 ug/kg/min via INTRAVENOUS

## 2024-04-11 MED ORDER — SUCCINYLCHOLINE CHLORIDE 200 MG/10ML IV SOSY
PREFILLED_SYRINGE | INTRAVENOUS | Status: DC | PRN
  Administered 2024-04-11: 120 mg via INTRAVENOUS

## 2024-04-11 MED ORDER — OXYCODONE HCL 5 MG PO TABS: 5.0000 mg | ORAL_TABLET | ORAL | 0 refills | Status: DC | PRN

## 2024-04-11 MED ORDER — OXYCODONE HCL 5 MG/5ML PO SOLN: 5.0000 mg | Freq: Once | ORAL | Status: DC | PRN

## 2024-04-11 MED ORDER — PROPOFOL 10 MG/ML IV BOLUS
INTRAVENOUS | Status: AC
  Filled 2024-04-11: qty 20

## 2024-04-11 SURGICAL SUPPLY — 26 items
BAG HAMPER (MISCELLANEOUS) ×2 IMPLANT
COVER LIGHT HANDLE STERIS (MISCELLANEOUS) ×4 IMPLANT
DISSECTOR SURG LIGASURE 21 (MISCELLANEOUS) ×2 IMPLANT
ELECTRODE REM PT RTRN 9FT ADLT (ELECTROSURGICAL) ×2 IMPLANT
GAUZE 4X4 16PLY ~~LOC~~+RFID DBL (SPONGE) ×2 IMPLANT
GAUZE SPONGE 4X4 12PLY STRL (GAUZE/BANDAGES/DRESSINGS) ×2 IMPLANT
GLOVE BIO SURGEON STRL SZ 6.5 (GLOVE) ×2 IMPLANT
GLOVE BIOGEL PI IND STRL 6.5 (GLOVE) ×2 IMPLANT
GLOVE BIOGEL PI IND STRL 7.0 (GLOVE) ×4 IMPLANT
GOWN STRL REUS W/TWL LRG LVL3 (GOWN DISPOSABLE) ×4 IMPLANT
HEMOSTAT SURGICEL 4X8 (HEMOSTASIS) ×2 IMPLANT
KIT TURNOVER CYSTO (KITS) ×2 IMPLANT
MANIFOLD NEPTUNE II (INSTRUMENTS) ×2 IMPLANT
NDL HYPO 21X1.5 SAFETY (NEEDLE) ×2 IMPLANT
NEEDLE HYPO 21X1.5 SAFETY (NEEDLE) ×1 IMPLANT
NS IRRIG 1000ML POUR BTL (IV SOLUTION) ×2 IMPLANT
PACK PERI GYN (CUSTOM PROCEDURE TRAY) ×2 IMPLANT
PAD ARMBOARD POSITIONER FOAM (MISCELLANEOUS) ×2 IMPLANT
POSITIONER HEAD 8X9X4 ADT (SOFTGOODS) ×2 IMPLANT
SET BASIN LINEN APH (SET/KITS/TRAYS/PACK) ×2 IMPLANT
SPONGE SURGIFOAM ABS GEL 100 (HEMOSTASIS) ×2 IMPLANT
SURGILUBE 2OZ TUBE FLIPTOP (MISCELLANEOUS) ×2 IMPLANT
SUT SILK 0 FSL (SUTURE) ×2 IMPLANT
SUT VIC AB 2-0 CT2 27 (SUTURE) IMPLANT
SYR 30ML LL (SYRINGE) ×4 IMPLANT
SYR BULB IRRIG 60ML STRL (SYRINGE) IMPLANT

## 2024-04-11 NOTE — Transfer of Care (Signed)
 Immediate Anesthesia Transfer of Care Note  Patient: Shannon Lin  Procedure(s) Performed: HEMORRHOIDECTOMY (Rectum)  Patient Location: PACU  Anesthesia Type:General  Level of Consciousness: drowsy  Airway & Oxygen Therapy: Patient Spontanous Breathing and Patient connected to face mask oxygen  Post-op Assessment: Report given to RN and Post -op Vital signs reviewed and stable  Post vital signs: Reviewed and stable  Last Vitals:  Vitals Value Taken Time  BP 111/79 04/11/24 08:22  Temp    Pulse 89 04/11/24 08:27  Resp 11 04/11/24 08:27  SpO2 97 % 04/11/24 08:27  Vitals shown include unfiled device data.  Last Pain:  Vitals:   04/11/24 0714  TempSrc: Oral  PainSc: 5       Patients Stated Pain Goal: 5 (04/11/24 0714)  Complications: No notable events documented.

## 2024-04-11 NOTE — Interval H&P Note (Signed)
 History and Physical Interval Note:  04/11/2024 7:31 AM  Shannon Lin  has presented today for surgery, with the diagnosis of HEMORRHOIDS, GRADE II.  The various methods of treatment have been discussed with the patient and family. After consideration of risks, benefits and other options for treatment, the patient has consented to  Procedure(s) with comments: HEMORRHOIDECTOMY (N/A) - EXTENSIVE as a surgical intervention.  The patient's history has been reviewed, patient examined, no change in status, stable for surgery.  I have reviewed the patient's chart and labs.  Questions were answered to the patient's satisfaction.     [name]

## 2024-04-11 NOTE — Op Note (Signed)
 [facility] Operative Note  04/11/24  Preoperative Diagnosis: Grade II-III hemorrhoids    Postoperative Diagnosis: Same    Procedure(s) Performed: Hemorrhoidectomy of external tag and internal column, right anterior; hemorrhoidectomy internal column left lateral    Surgeon: Manuelita BROCKS. Kallie, MD   Assistants: No qualified resident was available    Anesthesia: General endotracheal   Anesthesiologist: Kendell Yvonna PARAS, MD    Specimens: Right anterior,    Estimated Blood Loss: Minimal   Blood Replacement: None    Complications: None   Wound Class: Dirty (stool in vault)   Operative Indications: Shannon Lin is a 33 yo who has residual hemorrhoid tag and tissue on the right and wants to proceed with excision of the remaining hemorrhoid tissue. We discussed excision, risk of bleeding, infection, injury to the sphincter, anal stenosis.   Findings: Right anterior tag and hemorrhoidal tissue; lateral lateral internal hemorrhoid tissue    Procedure: The patient was taken to the operating room and placed supine. General endotracheal anesthesia was induced. Intravenous antibiotics were administered per protocol.  She was then placed in lithotomy.  The peritoneal and perianal region were prepped and draped in the usual sterile fashion.   On digital exam I felt no masses. The anoscope was inserted and I investigated. I noted a right anterior column with tag and left lateral internal column.  The posterior column did not have significant column and I saw scar from the prior removal.    I elevated the right posterior column with the Debakey and opened the skin with cautery. With the handheld Ligasure, I took the tag and the column ensuring no injury to the muscle, as I elevated. I then turned my attention to the internal left lateral column. There was no skin tags. I made an incision at the edge of the anal verge, and elevated the hemorrhoid column taking it with the ligasure in  a similar fashion ensuring no muscle was injured. Marcaine  was injected. A gel foam roll with viscous lidocaine  was inserted into the anus after hemostasis was achieved.   Final inspection revealed acceptable hemostasis. All counts were correct at the end of the case. The patient was awakened from anesthesia and extubated without complication.  The patient went to the PACU in stable condition.   Manuelita Kallie, MD [facility] [address] [place], [place] [address] [phone] (office)

## 2024-04-11 NOTE — Anesthesia Procedure Notes (Signed)
 Procedure Name: Intubation Date/Time: 04/11/2024 7:45 AM  Performed by: Augusta Daved SAILOR, CRNAPre-anesthesia Checklist: Patient identified, Emergency Drugs available, Suction available and Patient being monitored Patient Re-evaluated:Patient Re-evaluated prior to induction Oxygen Delivery Method: Circle System Utilized Preoxygenation: Pre-oxygenation with 100% oxygen Induction Type: IV induction Laryngoscope Size: Glidescope and 3 Grade View: Grade I Tube type: Oral Number of attempts: 1 Airway Equipment and Method: Stylet and Oral airway Placement Confirmation: ETT inserted through vocal cords under direct vision, positive ETCO2 and breath sounds checked- equal and bilateral Secured at: 22 cm Tube secured with: Tape Dental Injury: Teeth and Oropharynx as per pre-operative assessment

## 2024-04-11 NOTE — Discharge Instructions (Signed)
 Hemorrhoid Surgery: Please take your roxicodone  as prescribed and alternate with tylenol every 4-6 hours.   Do not take any aspirin or NSAIDs, ibuprofen  for 5 days.  Please keep the area clean and dry and take Sitz baths (swallow warm water  baths) for comfort and after bowel movements.  Please keep your stools soft and take fiber daily (metamucil) and colace (over the counter) to help prevent constipation.  If you have not had a BM in 2 days, please notify Dr. Kallie.  Expect some bleeding following the hemorrhoid surgery and significant pain.  Go to the ED with extensive bleeding, fevers, or chills.

## 2024-04-11 NOTE — Anesthesia Preprocedure Evaluation (Signed)
 Anesthesia Evaluation    Airway Mallampati: II       Dental no notable dental hx.    Pulmonary           Cardiovascular      Neuro/Psych    GI/Hepatic Neg liver ROS,GERD  ,,  Endo/Other  negative endocrine ROS    Renal/GU negative Renal ROS     Musculoskeletal negative musculoskeletal ROS (+)    Abdominal   Peds  Hematology negative hematology ROS (+)   Anesthesia Other Findings   Reproductive/Obstetrics negative OB ROS                              Anesthesia Physical Anesthesia Plan  ASA: 3  Anesthesia Plan: General   Post-op Pain Management:    Induction: Intravenous  PONV Risk Score and Plan: Ondansetron  and Midazolam   Airway Management Planned:   Additional Equipment:   Intra-op Plan:   Post-operative Plan:   Informed Consent:   Plan Discussed with:   Anesthesia Plan Comments:         Anesthesia Quick Evaluation

## 2024-04-11 NOTE — Anesthesia Postprocedure Evaluation (Signed)
 Anesthesia Post Note  Patient: Shannon Lin  Procedure(s) Performed: HEMORRHOIDECTOMY (Rectum)  Patient location during evaluation: Phase II Anesthesia Type: General Level of consciousness: awake Pain management: pain level controlled Vital Signs Assessment: post-procedure vital signs reviewed and stable Respiratory status: spontaneous breathing and respiratory function stable Cardiovascular status: blood pressure returned to baseline and stable Postop Assessment: no headache and no apparent nausea or vomiting Anesthetic complications: no Comments: Late entry   No notable events documented.   Last Vitals:  Vitals:   04/11/24 0915 04/11/24 0930  BP: 101/68 97/66  Pulse: 91 72  Resp: 13 12  Temp:  36.8 C  SpO2: 93% 96%    Last Pain:  Vitals:   04/11/24 0930  TempSrc: Oral  PainSc: 0-No pain                 Yvonna JINNY Bosworth

## 2024-04-12 ENCOUNTER — Encounter (HOSPITAL_COMMUNITY): Payer: Self-pay | Admitting: General Surgery

## 2024-04-12 LAB — SURGICAL PATHOLOGY

## 2024-04-13 ENCOUNTER — Ambulatory Visit: Payer: Self-pay

## 2024-04-13 ENCOUNTER — Telehealth: Payer: Self-pay | Admitting: *Deleted

## 2024-04-13 ENCOUNTER — Emergency Department (HOSPITAL_COMMUNITY): Payer: MEDICAID

## 2024-04-13 ENCOUNTER — Emergency Department (HOSPITAL_COMMUNITY)
Admission: EM | Admit: 2024-04-13 | Discharge: 2024-04-13 | Disposition: A | Discharge status: Home / Self Care | Payer: MEDICAID | Attending: Student | Admitting: Student

## 2024-04-13 ENCOUNTER — Other Ambulatory Visit: Payer: Self-pay

## 2024-04-13 ENCOUNTER — Encounter (HOSPITAL_COMMUNITY): Payer: Self-pay

## 2024-04-13 DIAGNOSIS — R531 Weakness: Secondary | ICD-10-CM | POA: Diagnosis not present

## 2024-04-13 DIAGNOSIS — K625 Hemorrhage of anus and rectum: Secondary | ICD-10-CM | POA: Insufficient documentation

## 2024-04-13 DIAGNOSIS — R110 Nausea: Secondary | ICD-10-CM | POA: Diagnosis not present

## 2024-04-13 DIAGNOSIS — R519 Headache, unspecified: Secondary | ICD-10-CM | POA: Diagnosis not present

## 2024-04-13 DIAGNOSIS — F840 Autistic disorder: Secondary | ICD-10-CM | POA: Diagnosis not present

## 2024-04-13 DIAGNOSIS — G8918 Other acute postprocedural pain: Secondary | ICD-10-CM | POA: Diagnosis not present

## 2024-04-13 DIAGNOSIS — R1031 Right lower quadrant pain: Secondary | ICD-10-CM | POA: Diagnosis not present

## 2024-04-13 DIAGNOSIS — H538 Other visual disturbances: Secondary | ICD-10-CM | POA: Insufficient documentation

## 2024-04-13 LAB — CBC
HCT: 40.8 % (ref 36.0–46.0)
Hemoglobin: 13.6 g/dL (ref 12.0–15.0)
MCH: 29.2 pg (ref 26.0–34.0)
MCHC: 33.3 g/dL (ref 30.0–36.0)
MCV: 87.6 fL (ref 80.0–100.0)
Platelets: 357 K/uL (ref 150–400)
RBC: 4.66 MIL/uL (ref 3.87–5.11)
RDW: 13 % (ref 11.5–15.5)
WBC: 10.3 K/uL (ref 4.0–10.5)
nRBC: 0 % (ref 0.0–0.2)

## 2024-04-13 LAB — COMPREHENSIVE METABOLIC PANEL WITH GFR
ALT: 21 U/L (ref 0–44)
AST: 18 U/L (ref 15–41)
Albumin: 4 g/dL (ref 3.5–5.0)
Alkaline Phosphatase: 83 U/L (ref 38–126)
Anion gap: 11 (ref 5–15)
BUN: 10 mg/dL (ref 6–20)
CO2: 27 mmol/L (ref 22–32)
Calcium: 9 mg/dL (ref 8.9–10.3)
Chloride: 99 mmol/L (ref 98–111)
Creatinine, Ser: 0.53 mg/dL (ref 0.44–1.00)
GFR, Estimated: >60 mL/min (ref >60)
Glucose, Bld: 116 mg/dL — ABNORMAL HIGH (ref 70–99)
Potassium: 4.1 mmol/L (ref 3.5–5.1)
Sodium: 137 mmol/L (ref 135–145)
Total Bilirubin: 0.8 mg/dL (ref 0.0–1.2)
Total Protein: 8.1 g/dL (ref 6.5–8.1)

## 2024-04-13 LAB — TYPE AND SCREEN
ABO/RH(D): A POS
Antibody Screen: NEGATIVE

## 2024-04-13 LAB — HCG, SERUM, QUALITATIVE: Preg, Serum: NEGATIVE

## 2024-04-13 LAB — CBG MONITORING, ED
Glucose-Capillary: 122 mg/dL — ABNORMAL HIGH (ref 70–99)
Glucose-Capillary: 118 mg/dL — ABNORMAL HIGH (ref 70–99)

## 2024-04-13 MED ORDER — IOHEXOL 300 MG/ML  SOLN
100.0000 mL | Freq: Once | INTRAMUSCULAR | Status: AC | PRN
Start: 2024-04-13 — End: 2024-04-13
  Administered 2024-04-13: 100 mL via INTRAVENOUS

## 2024-04-13 MED ORDER — SODIUM CHLORIDE 0.9 % IV BOLUS
1000.0000 mL | Freq: Once | INTRAVENOUS | Status: AC
  Administered 2024-04-13: 1000 mL via INTRAVENOUS

## 2024-04-13 MED ORDER — MORPHINE SULFATE (PF) 4 MG/ML IV SOLN
4.0000 mg | Freq: Once | INTRAVENOUS | Status: AC
  Administered 2024-04-13: 4 mg via INTRAVENOUS
  Filled 2024-04-13: qty 1

## 2024-04-13 MED ORDER — ONDANSETRON HCL 4 MG PO TABS: 4.0000 mg | ORAL_TABLET | Freq: Four times a day (QID) | ORAL | 0 refills | Status: DC

## 2024-04-13 MED ORDER — ONDANSETRON HCL 4 MG/2ML IJ SOLN
4.0000 mg | Freq: Once | INTRAMUSCULAR | Status: AC
  Administered 2024-04-13: 4 mg via INTRAVENOUS
  Filled 2024-04-13: qty 2

## 2024-04-13 NOTE — ED Notes (Signed)
 ED Provider at bedside.

## 2024-04-13 NOTE — Discharge Instructions (Signed)
 Your workup this evening was reassuring.  I recommend that you rest, drink plenty of water  over the next few days.  Continue your stool softeners as directed.  Call your surgeon tomorrow to arrange follow-up appointment.  Return the emergency department if you develop any new or worsening symptoms.

## 2024-04-13 NOTE — Telephone Encounter (Signed)
 FYI Only or Action Required?: FYI only for provider.  Patient was last seen in primary care on 12/29/2023 by Alphonsa Glendia LABOR, MD.  Called Nurse Triage reporting Hyperglycemia.  Symptoms began the past few days.  Interventions attempted: Rest, hydration, or home remedies and Other: hydration with sugar free drinks and water .  Symptoms are: gradually worsening.  Triage Disposition: Go to ED Now (or PCP Triage), Home Care  Patient/caregiver understands and will follow disposition?: Unsure---Mother states she will go home and take the patient to the Emergency Room.---She was at work.          Patient's mother called initially wanting to know if the patient should get back Metformin . At 2:20 PM today Blood Sugar was 168 Patient told the mother (the caller) that she hadnt eaten anything when she checked that blood sugar.  Nausea, Dizziness, and Diarrhea are what the patient has expressed to her mother Patient just had a surgery on Monday for hemorrhoids.  Mother states that she was just calling to see if the patient's PCP wants her to go back on Metformin  with her blood sugar being up like this.  This RN called the patient to see how she was doing: Patient states she is dizzy, having headaches, can't see straight, and feels like she lost too much blood  Patient denies fever and vomiting. Patient also states that she has passed 3 blood clots yesterday and it started back this morning. She states that she feels like if she stands up she will fall or pass out. Patient wanted this RN to call her mother back to advise her of this.  Patient is also advised that if anything worsens to call an ambulance. She verbalized understanding. This RN called the patient's mother and advised her that having severe dizziness after a surgery, with large amounts of bleeding, and headaches, & not being able to see well---with these symptoms the recommendation is to go to the Emergency Room. Mother states  that she will take her.   Reason for Disposition  Blood glucose 70-240 mg/dL (3.9 -86.6 mmol/L)  SEVERE dizziness (e.g., unable to stand, requires support to walk, feels like passing out now)  Protocols used: Diabetes - High Blood Sugar-A-AH, Dizziness - Lightheadedness-A-AH

## 2024-04-13 NOTE — Telephone Encounter (Signed)
 1st attempt [id number]   Copied from CRM [id number]. Topic: Clinical - Medical Advice >> Apr 13, 2024  2:36 PM Santiya F wrote: Reason for CRM: Patient's mother Shannon Lin is calling in because patient recently had surgery and is doing well, but today her blood sugar was 168 and patient hasn't eaten anything. She did have a zero sugar sprite but that's all. Shannon Lin says patient doesn't take insulin , she's just on Metformin , but is concerned and wants to know what she should do. Please follow up with [facility].

## 2024-04-13 NOTE — ED Triage Notes (Signed)
 Per Telephone notes, patient called bc CBG was 168 and thought she needed to start her metformin  back and was advised she didn't need to go to the ER but then patient called gen surg since had recent hemorroidectomy and reports HA nausea blurred vision and reports she has been passing clots and bleeding rectally and thinks she has lost too much blood.

## 2024-04-13 NOTE — ED Notes (Signed)
 Pt to CT

## 2024-04-13 NOTE — Telephone Encounter (Signed)
 Surgical Date: 04/11/2024 Procedure: Hemorrhoidectomy, Extensive   Received call from patient mother Shannon Lin [phone]~ telephone.   Patient mother reports that patient has not been eating as she has no appetite at this time. States that random CBG noted at 168. Reports that patient states that she drank Gatorade zero and Sprite.   States that she feels patient should resume Metformin . States that she called PCP, but was instead advised to take patient to ER.   Advised that patient does not require ER visit for random CBG of 168. Advised if fasting CBG's remain <200, patient should not require ER visit. Shannon Lin agreeable to plan.   Upon review of chart, noted that patient was contacted by PCP and reported fatigue, nausea, headache, blurry vision and complaints of loss of blood due to recent surgery. Patient was advised to go to ER with above symptoms.   Call placed to patient [phone]~ telephone.   Patient reports that she is passing large blood clots continuously and passing copious amounts of blood with BM's. States that she is having diarrhea frequently.   Patient reports that she feels she is bleeding too much. States that she feels dizzy, nauseated and has a severe headache.   Advised that she should go to ER for evaluation.   Call placed to patient mother, Shannon Lin and made aware of recommendations.

## 2024-04-14 NOTE — Telephone Encounter (Signed)
 Noted

## 2024-04-17 NOTE — ED Provider Notes (Signed)
 [place] [facility] AT [facility] Provider Note   CSN: [id number] Arrival date & time: 04/13/24  1620     Patient presents with: Post-op Problem   Shannon Lin is a 33 y.o. female.   HPI     Shannon Lin is a 33 y.o. female past medical history of anxiety, bipolar affective disorder, autistic disorder, prediabetes, and childhood seizures who presents to the Emergency Department requesting evaluation for generalized headache of gradual onset, nausea, blurred vision and rectal bleeding.  Patient underwent hemorrhoidectomy on 04/11/2024.  States the day after her procedure she was feeling well.  Did some shopping, following day, began having nausea generalized weakness and passing multiple clots of blood and bleeding from her rectum.  She was also concerned that her blood sugar may be elevated.  She contacted her surgeon's office and was advised to come to the ER for evaluation.  She is concerned that she may have lost too much blood.  She denies any syncope, abdominal pain, fever or chills.  Prior to Admission medications   Medication Sig Start Date End Date Taking? Authorizing Provider  ondansetron  (ZOFRAN ) 4 MG tablet Take 1 tablet (4 mg total) by mouth every 6 (six) hours. 04/13/24  Yes Sande Pickert, PA-C  ARIPiprazole  (ABILIFY ) 5 MG tablet Take 1 tablet (5 mg total) by mouth at bedtime. 02/04/21   Alphonsa Glendia LABOR, MD  cetirizine  (ZYRTEC  ALLERGY) 10 MG tablet Take 1 tablet (10 mg total) by mouth daily. 12/29/23   Alphonsa Glendia LABOR, MD  dicyclomine  (BENTYL ) 10 MG capsule Take 1 capsule (10 mg total) by mouth 4 (four) times daily -  before meals and at bedtime. 03/29/24 05/28/24  Kallie Manuelita BROCKS, MD  divalproex  (DEPAKOTE  ER) 500 MG 24 hr tablet TAKE 2 TABLETS BY MOUTH EVERY NIGHT AT BEDTIME 02/04/21   Alphonsa Glendia LABOR, MD  etonogestrel  (NEXPLANON ) 68 MG IMPL implant 1 each by Subdermal route once.    [provider]  fluticasone  (FLONASE ) 50 MCG/ACT nasal spray  Place 2 sprays into both nostrils daily. For congestion 12/29/23   Alphonsa Glendia LABOR, MD  GuanFACINE  HCl 3 MG TB24 TAKE 1 TABLET(3 MG) BY MOUTH EVERY MORNING 11/01/21   Hoskins, [name] C, NP  megestrol  (MEGACE ) 40 MG tablet 3x5d, 2x5d, then 1 daily to help control vaginal bleeding. Stop taking when bleeding stops. 10/27/23   Kizzie Suzen SAUNDERS, CNM  metFORMIN  (GLUCOPHAGE ) 500 MG tablet TAKE 1 TABLET BY MOUTH DAILY 12/29/23   Alphonsa Glendia LABOR, MD  naproxen  (NAPROSYN ) 500 MG tablet Take 1 tablet (500 mg total) by mouth 2 (two) times daily with a meal. 02/11/24   Carlan, Chelsea L, NP  ondansetron  (ZOFRAN -ODT) 4 MG disintegrating tablet Take 1 tablet (4 mg total) by mouth every 8 (eight) hours as needed for nausea or vomiting. 04/11/24   Kallie Manuelita BROCKS, MD  oxyCODONE  (ROXICODONE ) 5 MG immediate release tablet Take 1 tablet (5 mg total) by mouth every 4 (four) hours as needed for severe pain (pain score 7-10) or breakthrough pain. 04/11/24 04/11/25  Kallie Manuelita BROCKS, MD  pantoprazole  (PROTONIX ) 40 MG tablet TAKE 1 TABLET(40 MG) BY MOUTH DAILY 09/04/22   Alphonsa Glendia LABOR, MD  propranolol  ER (INDERAL  LA) 80 MG 24 hr capsule Take 1 capsule (80 mg total) by mouth daily. To prevent migraine 11/01/21   Mauro Elveria BROCKS, NP  valACYclovir  (VALTREX ) 1000 MG tablet 2 pills bid for 1 day -fever blister 08/13/21   Signa Delon LABOR, NP  Allergies: Patient has no known allergies.    Review of Systems  Constitutional:  Negative for appetite change, chills and fever.  Eyes:  Positive for visual disturbance.  Respiratory:  Negative for shortness of breath.   Cardiovascular:  Negative for chest pain.  Gastrointestinal:  Positive for blood in stool, diarrhea and nausea. Negative for abdominal pain and vomiting.  Genitourinary:  Negative for difficulty urinating and flank pain.  Musculoskeletal:  Negative for back pain.  Neurological:  Positive for weakness and headaches. Negative for dizziness.  Psychiatric/Behavioral:   Negative for confusion.     Updated Vital Signs BP 118/61   Pulse 99   Temp 98.4 F (36.9 C) (Oral)   Resp 18   Ht 5' 1 (1.549 m)   Wt 89.8 kg   LMP  (LMP Unknown) Comment: Urine Pregnancy Test Negative as of 04/06/24.  SpO2 99%   BMI 37.41 kg/m   Physical Exam Vitals and nursing note reviewed. Exam conducted with a chaperone present.  Constitutional:      General: She is not in acute distress.    Appearance: Normal appearance. She is not toxic-appearing.  HENT:     Mouth/Throat:     Mouth: Mucous membranes are moist.  Eyes:     Extraocular Movements: Extraocular movements intact.     Conjunctiva/sclera: Conjunctivae normal.     Pupils: Pupils are equal, round, and reactive to light.  Cardiovascular:     Rate and Rhythm: Normal rate and regular rhythm.     Pulses: Normal pulses.  Pulmonary:     Effort: Pulmonary effort is normal.  Abdominal:     General: There is no distension.     Palpations: Abdomen is soft.  Genitourinary:    Rectum: No anal fissure or external hemorrhoid. Normal anal tone.     Comments: Rectum visualized, small amount of frank blood present .  No active bleeding seen, anal fissure, or external hemorrhoid. Musculoskeletal:        General: Normal range of motion.     Right lower leg: No edema.     Left lower leg: No edema.  Skin:    General: Skin is warm.     Capillary Refill: Capillary refill takes less than 2 seconds.  Neurological:     Mental Status: She is alert.     Sensory: No sensory deficit.     Motor: No weakness.     (all labs ordered are listed, but only abnormal results are displayed) Labs Reviewed  COMPREHENSIVE METABOLIC PANEL WITH GFR - Abnormal; Notable for the following components:      Result Value   Glucose, Bld 116 (*)    All other components within normal limits  CBG MONITORING, ED - Abnormal; Notable for the following components:   Glucose-Capillary 122 (*)    All other components within normal limits  CBG  MONITORING, ED - Abnormal; Notable for the following components:   Glucose-Capillary 118 (*)    All other components within normal limits  CBC  HCG, SERUM, QUALITATIVE  TYPE AND SCREEN    EKG: None  Radiology: No results found.   Procedures   Medications Ordered in the ED  sodium chloride  0.9 % bolus 1,000 mL (0 mLs Intravenous Stopped 04/13/24 2244)  ondansetron  (ZOFRAN ) injection 4 mg (4 mg Intravenous Given 04/13/24 2112)  morphine  (PF) 4 MG/ML injection 4 mg (4 mg Intravenous Given 04/13/24 2112)  iohexol  (OMNIPAQUE ) 300 MG/ML solution 100 mL (100 mLs Intravenous Contrast Given 04/13/24 2122)  Medical Decision Making Patient here for evaluation of generalized weakness, nausea headache and concern for rectal bleeding 2 days postop from hemorrhoidectomy.  Describes passage of blood clots.  No chest pain shortness of breath fever chills, abdominal pain or syncope.   Vital signs are reassuring.  Likely postop bleeding from recent procedure.  Dehydration, electrolyte abnormality, acute GI bleed all considered  Amount and/or Complexity of Data Reviewed Labs: ordered.    Details: Labs no leukocytosis, her hemoglobin is reassuring.  Pregnancy test negative.  Her blood sugar here is 118 Radiology: ordered.    Details: CT abdomen pelvis shows likely postoperative changes. No loculated collection to suggest an abscess.  No evidence of SBO Discussion of management or test interpretation with external provider(s): Likely mild dehydration.  Given IV fluids here along with pain medicine and antiemetic.  On recheck, patient is feeling better.  Likely rectal bleeding secondary to her recent hemorrhoidectomy.  Recommended increased fluid intake continue stool softeners and close outpatient follow-up with her general surgeon.  Appears appropriate for discharge home at this time.  Return precautions were also discussed  Risk Prescription drug management.         Final diagnoses:  Post-operative pain    ED Discharge Orders          Ordered    ondansetron  (ZOFRAN ) 4 MG tablet  Every 6 hours        04/13/24 2323               Herlinda Milling, PA-C 04/17/24 1333    Kommor, Soso, MD 04/19/24 [phone]

## 2024-04-21 ENCOUNTER — Encounter: Payer: Self-pay | Admitting: Family Medicine

## 2024-04-26 ENCOUNTER — Ambulatory Visit (INDEPENDENT_AMBULATORY_CARE_PROVIDER_SITE_OTHER): Payer: MEDICAID | Admitting: Nurse Practitioner

## 2024-04-26 ENCOUNTER — Encounter: Payer: Self-pay | Admitting: Nurse Practitioner

## 2024-04-26 VITALS — BP 103/72 | HR 75 | Temp 98.0°F | Ht 61.0 in | Wt 201.0 lb

## 2024-04-26 DIAGNOSIS — R0789 Other chest pain: Secondary | ICD-10-CM

## 2024-04-26 MED ORDER — MELOXICAM 15 MG PO TABS: 15.0000 mg | ORAL_TABLET | Freq: Every day | ORAL | 0 refills | Status: DC

## 2024-04-28 ENCOUNTER — Encounter: Payer: Self-pay | Admitting: Nurse Practitioner

## 2024-04-28 NOTE — Progress Notes (Signed)
 Subjective:    Patient ID: Shannon Lin, female    DOB: [birth year], 33 y.o.   MRN: [id number]  HPI Presents for complaints of localized left lower chest wall pain for the past few weeks.  Had bilateral breast reduction in January.  No complications from surgery.  Does have more scar tissue on the left side.  Pain with pressure of this area.  Difficulty laying on her left side.  No fever.  No rash.   Review of Systems  Constitutional:  Negative for fever.  Respiratory:  Negative for cough, chest tightness and shortness of breath.   Cardiovascular:  Positive for chest pain.  Skin:  Negative for rash.      04/26/2024   11:17 AM  Depression screen PHQ 2/9  Decreased Interest 0  Down, Depressed, Hopeless 0  PHQ - 2 Score 0  Altered sleeping 1  Tired, decreased energy 0  Change in appetite 0  Feeling bad or failure about yourself  0  Trouble concentrating 0  Moving slowly or fidgety/restless 0  Suicidal thoughts 0  PHQ-9 Score 1  Difficult doing work/chores Not difficult at all      04/26/2024   11:17 AM 12/29/2023    1:07 PM 08/14/2023    2:52 PM 02/06/2023    1:16 PM  GAD 7 : Generalized Anxiety Score  Nervous, Anxious, on Edge 0 0 1 0  Control/stop worrying 0 0 0 0  Worry too much - different things 0 0 0 0  Trouble relaxing 1 0 1 0  Restless 0 0 0 0  Easily annoyed or irritable 0 0 0 0  Afraid - awful might happen 0 0 1 0  Total GAD 7 Score 1 0 3 0  Anxiety Difficulty Not difficult at all Not difficult at all  Not difficult at all    Social History   Tobacco Use   Smoking status: Never   Smokeless tobacco: Never  Vaping Use   Vaping status: Never Used  Substance Use Topics   Alcohol use: No   Drug use: No        Objective:   Physical Exam Vitals and nursing note reviewed.  Constitutional:      General: She is not in acute distress. Cardiovascular:     Rate and Rhythm: Normal rate and regular rhythm.  Pulmonary:     Effort: Pulmonary effort is  normal.     Breath sounds: Normal breath sounds.     Comments: Distinct localized chest wall tenderness in the left lower anterior chest wall just beneath the left breast.  There is some faint superficial nodularity noted on the chest wall.  No erythema or warmth.  Significant scar tissue noted beneath the left breast. Chest:     Chest wall: Tenderness present.  Abdominal:     General: Abdomen is flat. There is no distension.     Palpations: Abdomen is soft. There is no mass.     Tenderness: There is abdominal tenderness. There is no guarding or rebound.     Comments: Minimal tenderness in the left upper quadrant near the lower anterior chest wall.  Skin:    General: Skin is warm and dry.     Findings: No erythema or rash.  Neurological:     Mental Status: She is alert and oriented to person, place, and time.  Psychiatric:        Mood and Affect: Mood normal.        Behavior: Behavior  normal.        Thought Content: Thought content normal.        Judgment: Judgment normal.    Today's Vitals   04/26/24 1111  BP: 103/72  Pulse: 75  Temp: 98 F (36.7 C)  SpO2: 97%  Weight: 201 lb (91.2 kg)  Height: 5' 1 (1.549 m)   Body mass index is 37.98 kg/m. CLINICAL DATA:  Right lower quadrant abdominal pain. Recent hemorrhoidectomy. Headache, nausea, and blurred vision. Rectal bleeding.   EXAM: CT ABDOMEN AND PELVIS WITH CONTRAST   TECHNIQUE: Multidetector CT imaging of the abdomen and pelvis was performed using the standard protocol following bolus administration of intravenous contrast.   RADIATION DOSE REDUCTION: This exam was performed according to the departmental dose-optimization program which includes automated exposure control, adjustment of the mA and/or kV according to patient size and/or use of iterative reconstruction technique.   CONTRAST:  OMNIPAQUE  IOHEXOL  300 MG/ML  SOLN   COMPARISON:  10/27/2022   FINDINGS: Lower chest: Lung bases are clear.    Hepatobiliary: No focal liver abnormality is seen. No gallstones, gallbladder wall thickening, or biliary dilatation.   Pancreas: Unremarkable. No pancreatic ductal dilatation or surrounding inflammatory changes.   Spleen: Normal in size without focal abnormality.   Adrenals/Urinary Tract: Adrenal glands are unremarkable. Kidneys are normal, without renal calculi, focal lesion, or hydronephrosis. Bladder is unremarkable.   Stomach/Bowel: Stomach, small bowel, and colon are not abnormally distended. Appendix is normal. There is a small amount of gas and stranding in the perianal fat possibly related to postoperative change. No loculated collection.   Vascular/Lymphatic: No significant vascular findings are present. No enlarged abdominal or pelvic lymph nodes.   Reproductive: Uterus and bilateral adnexa are unremarkable.   Other: No abdominal wall hernia or abnormality. No abdominopelvic ascites.   Musculoskeletal: No acute or significant osseous findings.   IMPRESSION: 1. Small amount of soft tissue gas and stranding posterior to the inner rectal junction possibly representing postoperative change. No loculated collection to suggest an abscess. 2. No evidence of bowel obstruction.  Appendix is normal.     Electronically Signed   By: Elsie Gravely M.D.   On: 04/13/2024 21:47        Assessment & Plan:  Left-sided chest wall pain Meds ordered this encounter  Medications   meloxicam  (MOBIC ) 15 MG tablet    Sig: Take 1 tablet (15 mg total) by mouth daily. Prn pain; do not take with Ibuprofen  or OTC NSAIDs    Dispense:  30 tablet    Refill:  0    Supervising Provider:   ALPHONSA HAMILTON A [[id number]]   Superficial tenderness in the chest wall most likely related to her breast surgery and scar tissue.  There is no evidence of any lung issue.  Ice/heat applications.  Start meloxicam  as directed, do not take with OTC NSAIDs.  Expect slow gradual resolution of symptoms, warning  signs reviewed.  Recheck if worsens or persists.

## 2024-05-02 ENCOUNTER — Encounter: Payer: Self-pay | Admitting: Nurse Practitioner

## 2024-05-10 ENCOUNTER — Ambulatory Visit (INDEPENDENT_AMBULATORY_CARE_PROVIDER_SITE_OTHER): Payer: MEDICAID | Admitting: General Surgery

## 2024-05-10 ENCOUNTER — Encounter: Payer: Self-pay | Admitting: General Surgery

## 2024-05-10 VITALS — BP 113/71 | HR 92 | Temp 98.3°F | Resp 16 | Ht 61.0 in | Wt 202.0 lb

## 2024-05-10 DIAGNOSIS — K641 Second degree hemorrhoids: Secondary | ICD-10-CM

## 2024-05-10 NOTE — Progress Notes (Unsigned)
 [facility]  Doing well no issues. Some swelling in the left groin.  BP (!) 151/93   Pulse 61   Temp 98 F (36.7 C) (Oral)   Resp 14   Ht 5\' 8"  (1.727 m)   Wt 172 lb (78 kg)   SpO2 92%   BMI 26.15 kg/m  Port sites healing no erythema or drainage, minor swelling in the left groin  Patient s/p robotic assisted inguinal hernia repair with mesh on the left. Doing well.   Start back tennis slowly.  The swelling should improve with time.  If you want referral to an Orthopedic physician for the bicep tendon let us know.   Algis Greenhouse, MD [facility] [address] [place]ella Raring [place], [place] [address] [phone] (office)

## 2024-05-10 NOTE — Patient Instructions (Signed)
 Continue to keep your stools regular and soft. Bleeding should continue to slow down and stop. Call with questions or concerns.  I will reach out to Dr. Alphonsa and Mitzie Boettcher NP about someone prescribing your bentyl  in the future.

## 2024-05-11 ENCOUNTER — Encounter: Payer: Self-pay | Admitting: Family Medicine

## 2024-05-11 ENCOUNTER — Other Ambulatory Visit: Payer: Self-pay | Admitting: Family Medicine

## 2024-05-11 MED ORDER — DICYCLOMINE HCL 10 MG PO CAPS: 10.0000 mg | ORAL_CAPSULE | Freq: Three times a day (TID) | ORAL | 1 refills | Status: DC

## 2024-05-12 ENCOUNTER — Encounter: Payer: Self-pay | Admitting: Nurse Practitioner

## 2024-05-16 ENCOUNTER — Encounter: Payer: Self-pay | Admitting: Physician Assistant

## 2024-05-16 ENCOUNTER — Ambulatory Visit (INDEPENDENT_AMBULATORY_CARE_PROVIDER_SITE_OTHER): Payer: MEDICAID | Admitting: Physician Assistant

## 2024-05-16 VITALS — BP 122/76 | HR 82 | Temp 98.4°F | Wt 203.8 lb

## 2024-05-16 DIAGNOSIS — R0789 Other chest pain: Secondary | ICD-10-CM

## 2024-05-16 NOTE — Assessment & Plan Note (Addendum)
 Patient presents today with continued left sided chest wall pain. Patient is s/p breast reduction x 7 months. She presents with left sided antero-lateral chest wall pain, tender to palpation. No skin color changes, no warmth, no wound. Lungs clear to auscultation bilaterally, normal heart sounds. Low suspicion for cardiopulmonary pathology. Likely pain is related to her surgery and scar tissue. Advised ice and heat as needed. Continue tylenol and ibuprofen  as needed for pain, as she reports intolerance to meloxicam . Advised patient to call and schedule with plastic surgery office as symptoms have not improved. Warning signs and follow up precautions discussed.

## 2024-05-16 NOTE — Progress Notes (Signed)
   Acute Office Visit  Subjective:     Patient ID: Shannon Lin, female    DOB: 10/03/1991, 32 y.o.   MRN: [id number]   Patient presents today with complaints of left sided chest wall pain. She has previously been seen in our office for this concerns back in July. Patient has had a breast reduction in January of this year. Since that time she reports increased swelling under her left breast with associated pain and tenderness. She denies fevers, shortness of breath, pain with inhalation, or palpitations. Denies erythema, skin color changes, or warmth to area of concern. Previous given meloxicam  for pain control. Today, relates pain and swelling is persistent and has not improved since last visit. Patient appears well today.     Review of Systems  Constitutional:  Negative for chills, fever and malaise/fatigue.  Respiratory:  Negative for cough, shortness of breath and wheezing.   Cardiovascular:  Positive for chest pain. Negative for palpitations and leg swelling.  Gastrointestinal:  Negative for nausea and vomiting.  Musculoskeletal:  Negative for myalgias.  Skin:  Negative for itching and rash.  Neurological:  Negative for dizziness and headaches.  Endo/Heme/Allergies:  Does not bruise/bleed easily.        Objective:     BP 122/76   Pulse 82   Temp 98.4 F (36.9 C)   Wt 203 lb 12.8 oz (92.4 kg)   SpO2 99%   BMI 38.51 kg/m   Physical Exam Constitutional:      Appearance: Normal appearance. She is obese.  HENT:     Head: Normocephalic and atraumatic.     Mouth/Throat:     Mouth: Mucous membranes are moist.     Pharynx: Oropharynx is clear.  Eyes:     Extraocular Movements: Extraocular movements intact.     Conjunctiva/sclera: Conjunctivae normal.  Cardiovascular:     Rate and Rhythm: Normal rate and regular rhythm.     Heart sounds: Normal heart sounds. No murmur heard. Pulmonary:     Effort: Pulmonary effort is normal.     Breath sounds: Normal breath sounds.  No wheezing, rhonchi or rales.  Chest:    Skin:    General: Skin is warm and dry.  Neurological:     General: No focal deficit present.     Mental Status: She is alert and oriented to person, place, and time.  Psychiatric:        Mood and Affect: Mood normal.        Behavior: Behavior normal.     No results found for any visits on 05/16/24.      Assessment & Plan:  Left-sided chest wall pain Assessment & Plan: Patient presents today with continued left sided chest wall pain. Patient is s/p breast reduction x 7 months. She presents with left sided antero-lateral chest wall pain, tender to palpation. No skin color changes, no warmth, no wound. Lungs clear to auscultation bilaterally, normal heart sounds. Low suspicion for cardiopulmonary pathology. Likely pain is related to her surgery and scar tissue. Advised ice and heat as needed. Continue tylenol and ibuprofen  as needed for pain, as she reports intolerance to meloxicam . Advised patient to call and schedule with plastic surgery office as symptoms have not improved. Warning signs and follow up precautions discussed.     Return if symptoms worsen or fail to improve.  Charmaine Breslyn Abdo, PA-C

## 2024-05-17 ENCOUNTER — Encounter: Payer: Self-pay | Admitting: Plastic Surgery

## 2024-05-18 ENCOUNTER — Telehealth: Payer: Self-pay

## 2024-05-18 NOTE — Telephone Encounter (Signed)
 I attempted to call the patient in regards to her MyChart message. There was no answer, so I left a message for her to call the office back.
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
## 2024-05-18 NOTE — Telephone Encounter (Signed)
 The patient called the clinic back and I was able to speak with her. She denies fevers but has some complaints about nausea. She stated on her left side around her ribcage area it is a little swollen and tender to the touch. She has seen her PCP for this issue. I told her we can set up and appointment for her to be seen in the clinic, but in the meantime if symptoms get worse to call us  and she may need to go to the ED. Patient conveyed understanding.

## 2024-05-19 ENCOUNTER — Ambulatory Visit (INDEPENDENT_AMBULATORY_CARE_PROVIDER_SITE_OTHER): Payer: MEDICAID | Admitting: Physician Assistant

## 2024-05-19 ENCOUNTER — Encounter: Payer: Self-pay | Admitting: Physician Assistant

## 2024-05-19 VITALS — BP 127/85 | HR 91

## 2024-05-19 DIAGNOSIS — R0789 Other chest pain: Secondary | ICD-10-CM

## 2024-05-19 DIAGNOSIS — Z9889 Other specified postprocedural states: Secondary | ICD-10-CM

## 2024-05-27 ENCOUNTER — Encounter: Payer: Self-pay | Admitting: Radiology

## 2024-06-14 ENCOUNTER — Ambulatory Visit: Payer: MEDICAID | Admitting: General Surgery

## 2024-06-20 ENCOUNTER — Telehealth: Payer: Self-pay | Admitting: *Deleted

## 2024-06-20 NOTE — Telephone Encounter (Signed)
 Shannon Lin      06/18/24  7:27 AM Hey,I had charity an appt for this month with behavioral health(for her meds)they called and canceled  and put it off til March of next year!she needs her meds(as I need her to have them)is there any way you or dr kela  can help until I can get her in?her insurance changed wo me knowing I fixed it.its just having a hard time getting her to see another  therapist?thank you

## 2024-06-23 NOTE — Telephone Encounter (Signed)
 Unfortunately behavioral health is hard to find Referral team can try to get her no appointments sooner with one of the other behavioral health that would be on her insurance In the meantime I am willing to see her for a same-day office slot as available-preferably with me versus with [name]-she would have to bring all of her medications to that visit then I can decide how we will move forward in regards to prescriptions Even with the urgent referral we could literally take 4 to 12 weeks to be seen by behavioral health specialist  You may go ahead with the referral, also recommend a follow-up office visit with myself-challenging situation

## 2024-06-27 ENCOUNTER — Ambulatory Visit (HOSPITAL_COMMUNITY): Payer: MEDICAID | Admitting: Registered Nurse

## 2024-06-28 ENCOUNTER — Encounter: Payer: Self-pay | Admitting: Nurse Practitioner

## 2024-06-30 ENCOUNTER — Ambulatory Visit (INDEPENDENT_AMBULATORY_CARE_PROVIDER_SITE_OTHER): Payer: MEDICAID | Admitting: Family Medicine

## 2024-06-30 ENCOUNTER — Encounter: Payer: Self-pay | Admitting: Family Medicine

## 2024-06-30 VITALS — BP 119/80 | HR 84 | Ht 61.0 in | Wt 203.0 lb

## 2024-06-30 DIAGNOSIS — K625 Hemorrhage of anus and rectum: Secondary | ICD-10-CM | POA: Diagnosis not present

## 2024-06-30 DIAGNOSIS — K921 Melena: Secondary | ICD-10-CM

## 2024-06-30 DIAGNOSIS — R0789 Other chest pain: Secondary | ICD-10-CM | POA: Diagnosis not present

## 2024-06-30 NOTE — Patient Instructions (Addendum)
 Lab today.  Proceeding with US .  Please call Dr. Kallie office to follow up.  Take care  Dr. Bluford

## 2024-07-01 ENCOUNTER — Ambulatory Visit: Payer: Self-pay | Admitting: Family Medicine

## 2024-07-01 ENCOUNTER — Ambulatory Visit: Payer: Self-pay

## 2024-07-01 ENCOUNTER — Encounter (INDEPENDENT_AMBULATORY_CARE_PROVIDER_SITE_OTHER): Payer: Self-pay

## 2024-07-01 LAB — CBC
Hematocrit: 40.8 % (ref 34.0–46.6)
Hemoglobin: 13.3 g/dL (ref 11.1–15.9)
MCH: 29.1 pg (ref 26.6–33.0)
MCHC: 32.6 g/dL (ref 31.5–35.7)
MCV: 89 fL (ref 79–97)
Platelets: 316 x10E3/uL (ref 150–450)
RBC: 4.57 x10E6/uL (ref 3.77–5.28)
RDW: 13.1 % (ref 11.7–15.4)
WBC: 6.7 x10E3/uL (ref 3.4–10.8)

## 2024-07-01 NOTE — Telephone Encounter (Signed)
 Patient recommended to the ED for swelling and increased pain to left sided chest pain from a potential cyst. Patient reports increased pain and possible fevers. Patient reports her fiance stated he could feel heat coming off of her body last night. Patient endorses breaking out into cold sweats also. Patient verbalized understanding and states she will get checked out.   FYI Only or Action Required?: FYI only for provider.  Patient was last seen in primary care on 06/30/2024 by Cook, Jayce G, DO.  Called Nurse Triage reporting Cyst.  Symptoms began yesterday.  Interventions attempted: Rest, hydration, or home remedies.  Symptoms are: gradually worsening.  Triage Disposition: See HCP Within 4 Hours (Or PCP Triage)  Patient/caregiver understands and will follow disposition?: Yes  Copied from CRM #[id number]. Topic: Clinical - Red Word Triage >> Jul 01, 2024  2:59 PM Fonda T wrote: [facility] that prompted transfer to Nurse Triage: Patient calling states she has a cyst/knot on left side, that is causing increased pain, and has been running fever. Patient also reports she was unable to sleep last night. Reason for Disposition  [1] Swelling is red AND [2] fever  Answer Assessment - Initial Assessment Questions 1. APPEARANCE of SWELLING: What does it look like?     Red and painful to the touch 2. SIZE: How large is the swelling? (e.g., inches, cm; or compare to size of pinhead, tip of pen, eraser, coin, pea, grape, ping pong ball)      Size of a half dollar 3. LOCATION: Where is the swelling located?     Left chest wall 4. ONSET: When did the swelling start?     Started back in July 5. COLOR: What color is it? Is there more than one color?     Red 6. PAIN: Is there any pain? If Yes, ask: How bad is the pain? (Scale 1-10; or mild, moderate, severe)       6 out of 10-pain worsens at night 7. ITCH: Does it itch? If Yes, ask: How bad is the itch?      no 8. CAUSE: What do  you think caused the swelling?     unsure 9 OTHER SYMPTOMS: Do you have any other symptoms? (e.g., fever)     Possible fever  Protocols used: Skin Lump or Localized Swelling-A-AH

## 2024-07-03 DIAGNOSIS — K625 Hemorrhage of anus and rectum: Secondary | ICD-10-CM | POA: Insufficient documentation

## 2024-07-03 NOTE — Progress Notes (Signed)
 Subjective:  Patient ID: Shannon Lin, female    DOB: 30-Mar-1991  Age: 33 y.o. MRN: [id number]  CC:   Chief Complaint  Patient presents with   left rib area pain    Going on for a month, has moved from abd  No other symptoms present    HPI:  33 year old female presents for evaluation of the above.  Ongoing left chest wall pain/rib pain and swelling. This has been going on for months (started after breast reduction. Was seen by plastic surgery office on 8/14.  This is quite troublesome for her.   Also, patient reports ongoing rectal bleeding. Had hemorrhoidectomy on 7/7. Still having trouble. She has not been seen by surgeon since 8/5.  Patient Active Problem List   Diagnosis Date Noted   Rectal bleeding 07/03/2024   Left-sided chest wall pain 05/16/2024   Genetic testing 03/07/2024   Constipation 09/08/2023   Vasomotor rhinitis 08/15/2023   History of ovarian cyst 12/19/2022   Grade III hemorrhoids 06/12/2022   Bipolar 1 disorder (HCC) 02/04/2021   Migraine without aura and without status migrainosus, not intractable 05/03/2020   GERD (gastroesophageal reflux disease) 11/17/2019   Prediabetes 10/13/2019   Tubular adenoma of colon 08/11/2019   Nexplanon  insertion 08/12/2018   Hyperinsulinemia 10/14/2016    Social Hx   Social History   Socioeconomic History   Marital status: Significant Other    Spouse name: Not on file   Number of children: Not on file   Years of education: Not on file   Highest education level: 12th grade  Occupational History   Not on file  Tobacco Use   Smoking status: Never   Smokeless tobacco: Never  Vaping Use   Vaping status: Never Used  Substance and Sexual Activity   Alcohol use: No   Drug use: No   Sexual activity: Yes    Birth control/protection: Implant  Other Topics Concern   Not on file  Social History Narrative   Not on file   Social Drivers of Health   Financial Resource Strain: Low Risk  (04/25/2024)   Overall  Financial Resource Strain (CARDIA)    Difficulty of Paying Living Expenses: Not hard at all  Food Insecurity: Food Insecurity Present (04/25/2024)   Hunger Vital Sign    Worried About Radiation protection practitioner of Food in the Last Year: Sometimes true    Ran Out of Food in the Last Year: Never true  Transportation Needs: No Transportation Needs (04/25/2024)   PRAPARE - Administrator, Civil Service (Medical): No    Lack of Transportation (Non-Medical): No  Physical Activity: Inactive (04/25/2024)   Exercise Vital Sign    Days of Exercise per Week: 0 days    Minutes of Exercise per Session: Not on file  Stress: No Stress Concern Present (04/25/2024)   [employer] of Occupational Health - Occupational Stress Questionnaire    Feeling of Stress: Only a little  Social Connections: Unknown (04/25/2024)   Social Connection and Isolation Panel    Frequency of Communication with Friends and Family: Never    Frequency of Social Gatherings with Friends and Family: Once a week    Attends Religious Services: Never    Database administrator or Organizations: No    Attends Engineer, structural: Not on file    Marital Status: Patient declined    Review of Systems Per HPI  Objective:  BP 119/80   Pulse 84   Ht 5'  1 (1.549 m)   Wt 203 lb (92.1 kg)   SpO2 97%   BMI 38.36 kg/m      06/30/2024   10:36 AM 05/19/2024    2:45 PM 05/16/2024    8:26 AM  BP/Weight  Systolic BP 119 127 122  Diastolic BP 80 85 76  Wt. (Lbs) 203  203.8  BMI 38.36 kg/m2  38.51 kg/m2    Physical Exam Vitals and nursing note reviewed.  Constitutional:      General: She is not in acute distress.    Appearance: Normal appearance. She is obese.  HENT:     Head: Normocephalic and atraumatic.  Pulmonary:     Effort: Pulmonary effort is normal.     Breath sounds: Normal breath sounds.  Chest:       Comments: Firm, raised area at the labeled location. Neurological:     Mental Status: She is alert.   Psychiatric:     Comments: Anxious.     Lab Results  Component Value Date   WBC 6.7 06/30/2024   HGB 13.3 06/30/2024   HCT 40.8 06/30/2024   PLT 316 06/30/2024   GLUCOSE 116 (H) 04/13/2024   CHOL 188 12/31/2023   TRIG 179 (H) 12/31/2023   HDL 39 (L) 12/31/2023   LDLCALC 117 (H) 12/31/2023   ALT 21 04/13/2024   AST 18 04/13/2024   NA 137 04/13/2024   K 4.1 04/13/2024   CL 99 04/13/2024   CREATININE 0.53 04/13/2024   BUN 10 04/13/2024   CO2 27 04/13/2024   TSH 3.75 09/08/2023   HGBA1C 6.5 (H) 04/06/2024     Assessment & Plan:  Left-sided chest wall pain Assessment & Plan: US  for further evaluation.  Orders: -     US  CHEST SOFT TISSUE  Rectal bleeding Assessment & Plan: Hb returned normal. Advised to follow up with Gen surg.  Orders: -     CBC    Follow-up:  Return if symptoms worsen or fail to improve.  Jacqulyn Ahle DO [facility]

## 2024-07-03 NOTE — Assessment & Plan Note (Signed)
 Hb returned normal. Advised to follow up with Gen surg.

## 2024-07-03 NOTE — Assessment & Plan Note (Signed)
 [place] for further evaluation

## 2024-07-04 NOTE — Telephone Encounter (Signed)
 FYI

## 2024-07-07 ENCOUNTER — Ambulatory Visit (HOSPITAL_COMMUNITY)
Admission: RE | Admit: 2024-07-07 | Discharge: 2024-07-07 | Disposition: A | Discharge status: Home / Self Care | Payer: MEDICAID | Source: Ambulatory Visit | Attending: Family Medicine | Admitting: Family Medicine

## 2024-07-07 DIAGNOSIS — R0789 Other chest pain: Secondary | ICD-10-CM | POA: Diagnosis present

## 2024-07-07 DIAGNOSIS — R222 Localized swelling, mass and lump, trunk: Secondary | ICD-10-CM | POA: Diagnosis not present

## 2024-07-11 ENCOUNTER — Telehealth: Payer: Self-pay

## 2024-07-11 ENCOUNTER — Encounter: Payer: Self-pay | Admitting: Family Medicine

## 2024-07-11 NOTE — Telephone Encounter (Signed)
 Per Dr. Bluford  US  was negative. Follow up with Dr. Glendia   I would recommend a follow-up office visit if any ongoing troubles

## 2024-07-11 NOTE — Telephone Encounter (Signed)
 Copied from CRM [id number]. Topic: Clinical - Lab/Test Results >> Jul 11, 2024 12:47 PM Leonette SQUIBB wrote: Reason for CRM: patient is calling about the results of her US  on  Thursday last week  CB#  [phone]

## 2024-07-12 ENCOUNTER — Encounter: Payer: Self-pay | Admitting: Nurse Practitioner

## 2024-07-12 ENCOUNTER — Emergency Department (HOSPITAL_COMMUNITY)
Admission: EM | Admit: 2024-07-12 | Discharge: 2024-07-13 | Disposition: A | Discharge status: Home / Self Care | Attending: Emergency Medicine | Admitting: Emergency Medicine

## 2024-07-12 ENCOUNTER — Encounter (HOSPITAL_COMMUNITY): Payer: Self-pay

## 2024-07-12 ENCOUNTER — Other Ambulatory Visit: Payer: Self-pay

## 2024-07-12 DIAGNOSIS — R0789 Other chest pain: Secondary | ICD-10-CM | POA: Diagnosis not present

## 2024-07-12 DIAGNOSIS — R1084 Generalized abdominal pain: Secondary | ICD-10-CM | POA: Diagnosis not present

## 2024-07-12 DIAGNOSIS — G8929 Other chronic pain: Secondary | ICD-10-CM | POA: Insufficient documentation

## 2024-07-12 DIAGNOSIS — R000 Tachycardia, unspecified: Secondary | ICD-10-CM | POA: Diagnosis not present

## 2024-07-12 NOTE — ED Triage Notes (Addendum)
 Pt bib EMS from home with c/o left breast pain and numbness since Janurary 2025. Pt had bilateral breast reduction in January 2025.  Pt reports Pain 7/10, numbness L side chest, keeps pt. up at night, increased warmth to L breast. no redness, no discharge, L breast produces heat, reports has a knot under L side ribcage,

## 2024-07-12 NOTE — Telephone Encounter (Signed)
Patient aware of results- see mychart message

## 2024-07-13 ENCOUNTER — Encounter (INDEPENDENT_AMBULATORY_CARE_PROVIDER_SITE_OTHER): Payer: Self-pay | Admitting: *Deleted

## 2024-07-13 ENCOUNTER — Ambulatory Visit: Payer: Self-pay | Admitting: Physician Assistant

## 2024-07-13 ENCOUNTER — Telehealth: Payer: Self-pay | Admitting: *Deleted

## 2024-07-13 ENCOUNTER — Ambulatory Visit: Payer: Self-pay | Admitting: Family Medicine

## 2024-07-13 NOTE — Telephone Encounter (Signed)
 FYI Only or Action Required?: FYI only for provider.  Patient was last seen in primary care on 06/30/2024 by Cook, Jayce G, DO.  Called Nurse Triage reporting Cyst (L).  Symptoms began several months ago.  Symptoms are: gradually worsening.  Triage Disposition: See Physician Within 24 Hours  Patient/caregiver understands and will follow disposition?: Yes           Copied from CRM #[id number]. Topic: Clinical - Red Word Triage >> Jul 13, 2024  8:29 AM Larissa RAMAN wrote: [facility] that prompted transfer to Nurse Triage: pain in ribcage Reason for Disposition  [1] Swelling is painful to touch AND [2] no fever  Answer Assessment - Initial Assessment Questions Pt states she was turned away from ED last night as she was not an urgent case   Knot on left side of ribcage Pt states she noticed this two weeks after bilateral breast reduction (surgery date: Oct 20, 2023); surgeon is aware and he said it was an inflamed vein Getting bigger, size of a baseball Hot to touch Left boob is numb since surgery; left boob is way smaller than right boob Denies fever (99.34F last night), abnormal drainage, itching Pain in left boob sharp needles  Protocols used: Skin Lump or Localized Swelling-A-AH

## 2024-07-13 NOTE — Telephone Encounter (Signed)
 Upcoming Appt: No future appointments.  Disposition: Call PCP Within 24 Hours  Encounter Reason for Disposition: .  [1] Follow-up call from patient regarding patient's clinical status AND [2] information NON-URGENT Is this a pediatric patient?  No   Any recent, relevant visit?  Yes   Date/location of recent visit? 10/07 10/7 [facility] via EMS   Any related medications? No  Any relevant medical history?  Yes   What history? Back pain? Any interventions?  Yes   What has been done? (include related medication given, dose, and date/time) 01/14 Bilateral Breast Reduction   [facility] Eligibility  Patient is eligible for [facility] (according to most recent zip code and insurance information)?  No  Patient is [facility] eligible and appropriate for referral: No   Encounter Initial Assessment: 1. REASON FOR CALL or QUESTION: What is your reason for calling today? or How can I best     Appt desired-ED visit states they should follow up with PCP, Mom, Shannon Lin states Shannon Lin was evaluated at [facility] ED, they said the baseball-sized lump top of left rib cage is not concerning. Shannon Lin was discharge without any medication Mom says 7/10 pain. She will give ibuprofen  and call the office this morning for an appt. 2. CALLER: Document the source of call. (e.g., laboratory staff, caregiver or patient).     Shannon Lin, Mother   Encounter Protocols Used: PCP Call - No Triage-A-AH

## 2024-07-13 NOTE — Telephone Encounter (Signed)
 Shannon Lin- Patient scheduled appt with surgeon tomorrow for evaluation

## 2024-07-13 NOTE — Telephone Encounter (Signed)
 Copied from CRM #[id number]. Topic: Clinical - Lab/Test Results >> Jul 13, 2024  2:51 PM Selinda RAMAN wrote: Reason for CRM: Shannon Lin the mother of the patient called in wanting for the provider or a nurse to call and speak about her U/S as she knows it is negative but she still has questions. She does state the patient has an appointment tomorrow with the surgeon who did her breast reduction back in January

## 2024-07-13 NOTE — ED Provider Notes (Signed)
 [place] EMERGENCY DEPARTMENT AT [facility]  Provider Note  CSN: [id number] Arrival date & time: 07/12/24 2342  History Chief Complaint  Patient presents with   Breast Problem    Shannon Lin is a 33 y.o. female here for evaluation of chronic left chest wall pain ongoing for most of the year, she feels like it stems from a breast reduction surgery she had in January and is concerned the surgeon 'left something in there'. She has seen her surgeon and her PCP for same. She had an US  done 10/2 without mass. She called a nurse advice line tonight and after telling them her chest was hurting they advised her to call EMS.    Home Medications Prior to Admission medications   Medication Sig Start Date End Date Taking? Authorizing Provider  ARIPiprazole  (ABILIFY ) 5 MG tablet Take 1 tablet (5 mg total) by mouth at bedtime. 02/04/21   Alphonsa Glendia LABOR, MD  cetirizine  (ZYRTEC  ALLERGY) 10 MG tablet Take 1 tablet (10 mg total) by mouth daily. 12/29/23   Alphonsa Glendia LABOR, MD  dicyclomine  (BENTYL ) 10 MG capsule Take 1 capsule (10 mg total) by mouth 4 (four) times daily -  before meals and at bedtime. 05/11/24 07/10/24  Alphonsa Glendia LABOR, MD  divalproex  (DEPAKOTE  ER) 500 MG 24 hr tablet TAKE 2 TABLETS BY MOUTH EVERY NIGHT AT BEDTIME 02/04/21   Alphonsa Glendia LABOR, MD  etonogestrel  (NEXPLANON ) 68 MG IMPL implant 1 each by Subdermal route once.    [provider]  fluticasone  (FLONASE ) 50 MCG/ACT nasal spray Place 2 sprays into both nostrils daily. For congestion 12/29/23   Alphonsa Glendia LABOR, MD  GuanFACINE  HCl 3 MG TB24 TAKE 1 TABLET(3 MG) BY MOUTH EVERY MORNING 11/01/21   Mauro Elveria BROCKS, NP  megestrol  (MEGACE ) 40 MG tablet 3x5d, 2x5d, then 1 daily to help control vaginal bleeding. Stop taking when bleeding stops. 10/27/23   Kizzie Suzen SAUNDERS, CNM  meloxicam  (MOBIC ) 15 MG tablet Take 1 tablet (15 mg total) by mouth daily. Prn pain; do not take with Ibuprofen  or OTC NSAIDs 04/26/24   Hoskins, [name]  C, NP  metFORMIN  (GLUCOPHAGE ) 500 MG tablet TAKE 1 TABLET BY MOUTH DAILY 12/29/23   Alphonsa Glendia LABOR, MD  pantoprazole  (PROTONIX ) 40 MG tablet TAKE 1 TABLET(40 MG) BY MOUTH DAILY 09/04/22   Alphonsa Glendia LABOR, MD  propranolol  ER (INDERAL  LA) 80 MG 24 hr capsule Take 1 capsule (80 mg total) by mouth daily. To prevent migraine 11/01/21   Mauro Elveria BROCKS, NP  valACYclovir  (VALTREX ) 1000 MG tablet 2 pills bid for 1 day -fever blister 08/13/21   Signa Delon LABOR, NP     Allergies    Patient has no known allergies.   Review of Systems   Review of Systems Please see HPI for pertinent positives and negatives  Physical Exam BP (!) 134/98 (BP Location: Right Arm)   Pulse 91   Temp 99.1 F (37.3 C) (Oral)   Resp 18   Ht 5' 1 (1.549 m)   Wt 88.9 kg   SpO2 99%   BMI 37.03 kg/m   Physical Exam Vitals and nursing note reviewed.  HENT:     Head: Normocephalic.     Nose: Nose normal.  Eyes:     Extraocular Movements: Extraocular movements intact.  Pulmonary:     Effort: Pulmonary effort is normal.     Comments: The patient's area of concern is her lower left ribs, they are asymmetric compared  to the right but there is no palpable mass or signs of infection Musculoskeletal:        General: Normal range of motion.     Cervical back: Neck supple.  Skin:    Findings: No rash (on exposed skin).  Neurological:     Mental Status: She is alert and oriented to person, place, and time.  Psychiatric:        Mood and Affect: Mood normal.     ED Results / Procedures / Treatments   EKG None  Procedures Procedures  Medications Ordered in the ED Medications - No data to display  Initial Impression and Plan  Patient here with chronic chest wall discomfort, some asymmetry of rib cage but no palpable mass. Patient reassured no emergent cause of her symptoms is present. No indication for additional ED workup. Offered APAP or Motrin  but she refused. Stable for discharge. PCP follow up, RTED  for any other concerns.    ED Course       MDM Rules/Calculators/A&P Medical Decision Making Problems Addressed: Chronic chest wall pain: chronic illness or injury  Risk OTC drugs.     Final Clinical Impression(s) / ED Diagnoses Final diagnoses:  Chronic chest wall pain    Rx / DC Orders ED Discharge Orders     None        Roselyn Carlin NOVAK, MD 07/13/24 [phone]

## 2024-07-14 ENCOUNTER — Encounter: Payer: Self-pay | Admitting: Physician Assistant

## 2024-07-14 ENCOUNTER — Encounter: Payer: Self-pay | Admitting: Family Medicine

## 2024-07-14 ENCOUNTER — Ambulatory Visit: Admitting: Physician Assistant

## 2024-07-14 VITALS — BP 127/83 | HR 86 | Ht 61.0 in | Wt 202.6 lb

## 2024-07-14 DIAGNOSIS — Z9889 Other specified postprocedural states: Secondary | ICD-10-CM

## 2024-07-14 DIAGNOSIS — R0789 Other chest pain: Secondary | ICD-10-CM
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
## 2024-07-20 ENCOUNTER — Ambulatory Visit (HOSPITAL_COMMUNITY)
Admission: RE | Admit: 2024-07-20 | Discharge: 2024-07-20 | Disposition: A | Discharge status: Home / Self Care | Source: Ambulatory Visit | Attending: Physician Assistant | Admitting: Physician Assistant

## 2024-07-20 ENCOUNTER — Encounter (INDEPENDENT_AMBULATORY_CARE_PROVIDER_SITE_OTHER): Payer: Self-pay | Admitting: Gastroenterology

## 2024-07-20 DIAGNOSIS — R0789 Other chest pain: Secondary | ICD-10-CM | POA: Insufficient documentation

## 2024-07-26 ENCOUNTER — Encounter: Payer: Self-pay | Admitting: *Deleted

## 2024-07-26 ENCOUNTER — Telehealth: Payer: Self-pay

## 2024-07-26 NOTE — Telephone Encounter (Signed)
 Patient notified via mychart

## 2024-07-26 NOTE — Telephone Encounter (Signed)
 Copied from CRM #[id number]. Topic: Clinical - Lab/Test Results >> Jul 26, 2024  9:21 AM Antony RAMAN wrote: Reason for CRM: PT wanting to understand her CT results from yesterday, she mentioned lesion on t2 vertrabrae. Please advise

## 2024-07-28 ENCOUNTER — Encounter: Payer: Self-pay | Admitting: Family Medicine

## 2024-07-28 ENCOUNTER — Ambulatory Visit: Payer: Self-pay

## 2024-07-28 NOTE — Telephone Encounter (Signed)
 FYI Only or Action Required?: FYI only for provider.  Patient was last seen in primary care on 06/30/2024 by Cook, Jayce G, DO.  Called Nurse Triage reporting Rash.  Symptoms began a week ago.  Interventions attempted: Nothing.  Symptoms are: gradually worsening.  Triage Disposition: See PCP When Office is Open (Within 3 Days)  Patient/caregiver understands and will follow disposition?: Yes, will follow disposition  Copied from CRM #[id number]. Topic: Appointments - Appointment Scheduling >> Jul 28, 2024  2:38 PM Turkey B wrote: Patient has rash on stomach and top of legs Reason for Disposition  Mild widespread rash  (Exception: Heat rash lasting 3 days or less.)  Answer Assessment - Initial Assessment Questions 1. APPEARANCE of RASH: What does the rash look like? (e.g., blisters, dry flaky skin, red spots, redness, sores)     Burning sensation, abd skin fold,  2. SIZE: How big are the spots? (e.g., tip of pen, eraser, coin; inches, centimeters)     Hives I don't know how to explain it, some are small some are big 3. LOCATION: Where is the rash located?     Skin fold under abd and top of femurs 4. COLOR: What color is the rash? (Note: It is difficult to assess rash color in people with darker-colored skin. When this situation occurs, simply ask the caller to describe what they see.)     red 5. ONSET: When did the rash begin?     Since about a week ago 6. FEVER: Do you have a fever? If Yes, ask: What is your temperature, how was it measured, and when did it start?     denies 7. ITCHING: Does the rash itch? If Yes, ask: How bad is the itch? (Scale 1-10; or mild, moderate, severe)     Denies itching, just burns 8. CAUSE: What do you think is causing the rash?     I had a CT without contrast 9. MEDICINE FACTORS: Have you started any new medicines within the last 2 weeks? (e.g., antibiotics)      denies 10. OTHER SYMPTOMS: Do you have any other  symptoms? (e.g., dizziness, headache, sore throat, joint pain)       denies 11. PREGNANCY: Is there any chance you are pregnant? When was your last menstrual period?       denies  Protocols used: Rash or Redness - [employer]

## 2024-07-29 ENCOUNTER — Encounter (INDEPENDENT_AMBULATORY_CARE_PROVIDER_SITE_OTHER): Payer: Self-pay | Admitting: Gastroenterology

## 2024-07-29 ENCOUNTER — Ambulatory Visit (INDEPENDENT_AMBULATORY_CARE_PROVIDER_SITE_OTHER): Admitting: Gastroenterology

## 2024-07-29 ENCOUNTER — Ambulatory Visit (INDEPENDENT_AMBULATORY_CARE_PROVIDER_SITE_OTHER)

## 2024-07-29 VITALS — BP 123/76 | HR 88 | Ht 61.0 in | Wt 203.0 lb

## 2024-07-29 VITALS — BP 105/65 | HR 90 | Temp 97.3°F | Ht 61.0 in | Wt 203.0 lb

## 2024-07-29 DIAGNOSIS — K625 Hemorrhage of anus and rectum: Secondary | ICD-10-CM

## 2024-07-29 DIAGNOSIS — L509 Urticaria, unspecified: Secondary | ICD-10-CM | POA: Diagnosis not present

## 2024-07-29 DIAGNOSIS — K642 Third degree hemorrhoids: Secondary | ICD-10-CM

## 2024-07-29 DIAGNOSIS — D126 Benign neoplasm of colon, unspecified: Secondary | ICD-10-CM

## 2024-07-29 MED ORDER — HYDROXYZINE PAMOATE 25 MG PO CAPS: 25.0000 mg | ORAL_CAPSULE | Freq: Three times a day (TID) | ORAL | 0 refills | Status: DC | PRN

## 2024-07-29 MED ORDER — DICYCLOMINE HCL 10 MG PO CAPS: 10.0000 mg | ORAL_CAPSULE | Freq: Three times a day (TID) | ORAL | 1 refills | Status: DC

## 2024-07-29 MED ORDER — METHYLPREDNISOLONE 4 MG PO TBPK: ORAL_TABLET | ORAL | 0 refills | Status: DC
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
## 2024-07-29 NOTE — Patient Instructions (Signed)
 Schedule colonoscopy Continue Bentyl  as needed for abdominal pain If presenting harder stools, start taking MiraLAX 1-3 capfuls per day Please notify us  if presenting worsening rectal bleeding

## 2024-07-29 NOTE — Telephone Encounter (Signed)
 Appointment scheduled.

## 2024-07-31 NOTE — Progress Notes (Signed)
 Established Patient Office Visit  Subjective   Patient ID: Shannon Lin, female    DOB: 1991-09-01  Age: 33 y.o. MRN: [id number]  Chief Complaint  Patient presents with   Medical Management of Chronic Issues    Pt here due to a rash on her stomach and under her stomach, states it don't itch but it feels like it is on fire happened after ct scan didn't have any contrast so dont know what it is from     HPI Discussed the use of AI scribe software for clinical note transcription with the patient, who gave verbal consent to proceed.  History of Present Illness   Shannon Lin is a 33 year old female who presents with a rash on her abdomen and legs.  Cutaneous eruption - Rash characterized as hives with red dots, most pronounced on the left side of the abdomen and present on both legs - Onset a few days ago, with most significant symptoms appearing yesterday - Associated burning sensation described as 'like it's on fire' - No pruritus (no itching) - No prior similar episodes except in the context of contrast exposure during CT scan, but most recent CT scan on Wednesday was performed without contrast - No medication taken for the rash due to lack of availability at home  Environmental and allergen exposure - No recent changes in environment or exposure to new clothing - Recent exposure to a new cat at her sister-in-law's house - No other changes in health or environment identified that could explain the rash      Patient Active Problem List   Diagnosis Date Noted   Rectal bleeding 07/03/2024   Left-sided chest wall pain 05/16/2024   Genetic testing 03/07/2024   Constipation 09/08/2023   Vasomotor rhinitis 08/15/2023   History of ovarian cyst 12/19/2022   Grade III hemorrhoids 06/12/2022   Bipolar 1 disorder (HCC) 02/04/2021   Migraine without aura and without status migrainosus, not intractable 05/03/2020   History of colon polyps 11/17/2019   GERD (gastroesophageal  reflux disease) 11/17/2019   Prediabetes 10/13/2019   Tubular adenoma of colon 08/11/2019   Nexplanon  insertion 08/12/2018   Hyperinsulinemia 10/14/2016      ROS    Objective:     BP 123/76   Pulse 88   Ht 5' 1 (1.549 m)   Wt 203 lb (92.1 kg)   SpO2 94%   BMI 38.36 kg/m  BP Readings from Last 3 Encounters:  07/29/24 123/76  07/29/24 105/65  07/14/24 127/83   Wt Readings from Last 3 Encounters:  07/29/24 203 lb (92.1 kg)  07/29/24 203 lb (92.1 kg)  07/14/24 202 lb 9.6 oz (91.9 kg)      Physical Exam Vitals and nursing note reviewed.  Constitutional:      Appearance: Normal appearance.  HENT:     Head: Normocephalic.  Eyes:     Extraocular Movements: Extraocular movements intact.     Pupils: Pupils are equal, round, and reactive to light.  Cardiovascular:     Rate and Rhythm: Normal rate and regular rhythm.  Pulmonary:     Effort: Pulmonary effort is normal.     Breath sounds: Normal breath sounds.  Musculoskeletal:     Cervical back: Normal range of motion and neck supple.  Skin:    Findings: Rash (abdomen) present. Rash is macular.  Neurological:     Mental Status: She is alert and oriented to person, place, and time.  Psychiatric:  Mood and Affect: Mood normal.        Thought Content: Thought content normal.      No results found for any visits on 07/29/24.    The ASCVD Risk score (Arnett DK, et al., 2019) failed to calculate for the following reasons:   The 2019 ASCVD risk score is only valid for ages 15 to 17    Assessment & Plan:   Problem List Items Addressed This Visit   None Visit Diagnoses       Hives    -  Primary   No pruritus or allergen exposure. - Prescribed steroid pack as directed. - Prescribed hydroxyzine every eigh   Relevant Medications   methylPREDNISolone (MEDROL DOSEPAK) 4 MG TBPK tablet   hydrOXYzine (VISTARIL) 25 MG capsule      No follow-ups on file.    Leita Longs, FNP

## 2024-08-01 ENCOUNTER — Ambulatory Visit: Admitting: Physician Assistant

## 2024-08-01 ENCOUNTER — Other Ambulatory Visit: Payer: Self-pay

## 2024-08-01 ENCOUNTER — Encounter: Payer: Self-pay | Admitting: Family Medicine

## 2024-08-01 ENCOUNTER — Telehealth: Payer: Self-pay
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
## 2024-08-01 NOTE — Telephone Encounter (Signed)
 Copied from CRM [id number]. Topic: Clinical - Prescription Issue >> Aug 01, 2024 10:32 AM Tonda B wrote: Reason for CRM: patient is calling have questions about her rx dicyclomine  (BENTYL ) 10 MG capsuleplease call pt back [phone]

## 2024-08-02 ENCOUNTER — Telehealth (INDEPENDENT_AMBULATORY_CARE_PROVIDER_SITE_OTHER): Payer: Self-pay

## 2024-08-02 MED ORDER — PEG 3350-KCL-NA BICARB-NACL 420 G PO SOLR: 4000.0000 mL | Freq: Once | ORAL | 0 refills | Status: AC

## 2024-08-02 NOTE — Telephone Encounter (Signed)
 Spoke with patient, scheduled TCS for 08/26/2024 at 11:00am (overbooking okay per endo-Anitra). Rx sent to pharmacy. Instructions sent to mychart.

## 2024-08-02 NOTE — Telephone Encounter (Signed)
 PA on [facility] for TCS:  Thank you for your online prior authorization/notification submission. The prior authorization/notification reference number is: [id number]. The prior authorization/notification case information was transmitted on 08/02/2024 at 07:11 AM CDT .

## 2024-08-03 ENCOUNTER — Ambulatory Visit (INDEPENDENT_AMBULATORY_CARE_PROVIDER_SITE_OTHER): Admitting: Plastic Surgery

## 2024-08-03 DIAGNOSIS — Z9889 Other specified postprocedural states: Secondary | ICD-10-CM

## 2024-08-03 DIAGNOSIS — R0789 Other chest pain: Secondary | ICD-10-CM | POA: Diagnosis not present

## 2024-08-03 NOTE — Progress Notes (Signed)
 Spoke with Shannon Lin by phone today.  Confirmed that it was her with 2 separate identifiers.  She confirmed that she was at home and and I confirmed that I was in my office.  Our phone conversation lasted approximately 5 minutes  I reviewed her CT scan findings with her.  I had reviewed the CT scan independently and the radiology review.  I did not see anything on the CT scan on the left costal margin, chest wall, ribs.  This was confirmed in the radiology reading.  She was concerned about a finding of a mildly sclerotic lesion at T2 which she is convinced that this is a sign of scoliosis.  I told her that this is not an area of expertise of mine and I strongly recommended that she follow-up with her primary care regarding this finding.  I also strongly recommended that she work with her primary care physician regarding the discomfort in her chest wall which does not seem to be related to her surgery.   She may follow-up with me as needed.

## 2024-08-03 NOTE — Telephone Encounter (Signed)
 Mychart response sent

## 2024-08-08 ENCOUNTER — Encounter: Payer: Self-pay | Admitting: Radiology

## 2024-08-09 NOTE — Telephone Encounter (Signed)
 PA approved via [facility] [id number] DOS: 08/26/24-11/24/24

## 2024-08-26 ENCOUNTER — Ambulatory Visit (HOSPITAL_COMMUNITY): Payer: Self-pay | Admitting: Certified Registered Nurse Anesthetist

## 2024-08-26 ENCOUNTER — Encounter (HOSPITAL_COMMUNITY)
Admission: RE | Disposition: A | Discharge status: Home / Self Care | Payer: Self-pay | Source: Home / Self Care | Attending: Gastroenterology

## 2024-08-26 ENCOUNTER — Other Ambulatory Visit: Payer: Self-pay

## 2024-08-26 ENCOUNTER — Encounter (HOSPITAL_COMMUNITY): Payer: Self-pay | Admitting: Gastroenterology

## 2024-08-26 ENCOUNTER — Telehealth: Payer: Self-pay | Admitting: *Deleted

## 2024-08-26 ENCOUNTER — Encounter (INDEPENDENT_AMBULATORY_CARE_PROVIDER_SITE_OTHER): Payer: Self-pay | Admitting: Gastroenterology

## 2024-08-26 ENCOUNTER — Ambulatory Visit (HOSPITAL_BASED_OUTPATIENT_CLINIC_OR_DEPARTMENT_OTHER): Payer: Self-pay | Admitting: Certified Registered Nurse Anesthetist

## 2024-08-26 ENCOUNTER — Ambulatory Visit (HOSPITAL_COMMUNITY)
Admission: RE | Admit: 2024-08-26 | Discharge: 2024-08-26 | Disposition: A | Discharge status: Home / Self Care | Attending: Gastroenterology | Admitting: Gastroenterology

## 2024-08-26 DIAGNOSIS — Z860100 Personal history of colon polyps, unspecified: Secondary | ICD-10-CM | POA: Diagnosis not present

## 2024-08-26 DIAGNOSIS — R7303 Prediabetes: Secondary | ICD-10-CM | POA: Insufficient documentation

## 2024-08-26 DIAGNOSIS — F419 Anxiety disorder, unspecified: Secondary | ICD-10-CM

## 2024-08-26 DIAGNOSIS — Z860101 Personal history of adenomatous and serrated colon polyps: Secondary | ICD-10-CM | POA: Insufficient documentation

## 2024-08-26 DIAGNOSIS — K219 Gastro-esophageal reflux disease without esophagitis: Secondary | ICD-10-CM | POA: Insufficient documentation

## 2024-08-26 DIAGNOSIS — J45909 Unspecified asthma, uncomplicated: Secondary | ICD-10-CM | POA: Diagnosis not present

## 2024-08-26 DIAGNOSIS — Z1211 Encounter for screening for malignant neoplasm of colon: Secondary | ICD-10-CM | POA: Diagnosis not present

## 2024-08-26 DIAGNOSIS — Z8719 Personal history of other diseases of the digestive system: Secondary | ICD-10-CM | POA: Insufficient documentation

## 2024-08-26 DIAGNOSIS — F319 Bipolar disorder, unspecified: Secondary | ICD-10-CM | POA: Insufficient documentation

## 2024-08-26 DIAGNOSIS — F909 Attention-deficit hyperactivity disorder, unspecified type: Secondary | ICD-10-CM | POA: Diagnosis not present

## 2024-08-26 DIAGNOSIS — F840 Autistic disorder: Secondary | ICD-10-CM | POA: Diagnosis not present

## 2024-08-26 HISTORY — PX: FLEXIBLE SIGMOIDOSCOPY: SHX5431

## 2024-08-26 SURGERY — SIGMOIDOSCOPY, FLEXIBLE
Anesthesia: General

## 2024-08-26 MED ORDER — PROPOFOL 10 MG/ML IV BOLUS
INTRAVENOUS | Status: DC | PRN
  Administered 2024-08-26: 200 mg via INTRAVENOUS
  Administered 2024-08-26: 175 ug/kg/min via INTRAVENOUS

## 2024-08-26 MED ORDER — PROPOFOL 500 MG/50ML IV EMUL
INTRAVENOUS | Status: AC
  Filled 2024-08-26: qty 50

## 2024-08-26 MED ORDER — LACTATED RINGERS IV SOLN: INTRAVENOUS | Status: DC | PRN

## 2024-08-26 NOTE — Anesthesia Preprocedure Evaluation (Signed)
 Anesthesia Evaluation  Patient identified by MRN, date of birth, ID band Patient awake    Reviewed: Allergy & Precautions, H&P , NPO status , Patient's Chart, lab work & pertinent test results, reviewed documented beta blocker date and time   History of Anesthesia Complications (+) PONV and history of anesthetic complications  Airway Mallampati: II  TM Distance: >3 FB Neck ROM: full    Dental no notable dental hx. (+) Dental Advisory Given, Teeth Intact   Pulmonary asthma    Pulmonary exam normal breath sounds clear to auscultation       Cardiovascular Exercise Tolerance: Good Normal cardiovascular exam Rhythm:regular Rate:Normal     Neuro/Psych  Headaches, Seizures -,  PSYCHIATRIC DISORDERS Anxiety  Bipolar Disorder      GI/Hepatic negative GI ROS, Neg liver ROS,GERD  ,,  Endo/Other  negative endocrine ROS  prediabetes  Renal/GU negative Renal ROS  negative genitourinary   Musculoskeletal negative musculoskeletal ROS (+)    Abdominal   Peds  Hematology negative hematology ROS (+)   Anesthesia Other Findings autism  Reproductive/Obstetrics negative OB ROS                              Anesthesia Physical Anesthesia Plan  ASA: 3  Anesthesia Plan: General   Post-op Pain Management: Minimal or no pain anticipated   Induction: Intravenous  PONV Risk Score and Plan: Propofol  infusion  Airway Management Planned: Natural Airway and Nasal Cannula  Additional Equipment: None  Intra-op Plan:   Post-operative Plan:   Informed Consent: I have reviewed the patients History and Physical, chart, labs and discussed the procedure including the risks, benefits and alternatives for the proposed anesthesia with the patient or authorized representative who has indicated his/her understanding and acceptance.     Dental Advisory Given  Plan Discussed with: CRNA  Anesthesia Plan Comments:           Anesthesia Quick Evaluation

## 2024-08-26 NOTE — Anesthesia Postprocedure Evaluation (Signed)
 Anesthesia Post Note  Patient: Shannon Lin  Procedure(s) Performed: KINGSTON SIDE  Patient location during evaluation: Endoscopy Anesthesia Type: General Level of consciousness: awake and alert Pain management: pain level controlled Vital Signs Assessment: post-procedure vital signs reviewed and stable Respiratory status: spontaneous breathing, nonlabored ventilation and respiratory function stable Cardiovascular status: stable Anesthetic complications: no   There were no known notable events for this encounter.   Last Vitals:  Vitals:   08/26/24 0946 08/26/24 1015  BP: 130/65 111/64  Pulse: 80 87  Resp: 15 17  Temp: 37.2 C 37 C  SpO2: 97% 96%    Last Pain:  Vitals:   08/26/24 1015  TempSrc: Oral  PainSc: 0-No pain                 Niomie Englert L Ayako Tapanes

## 2024-08-26 NOTE — Interval H&P Note (Signed)
 History and Physical Interval Note:  08/26/2024 9:28 AM  Shannon Lin  has presented today for surgery, with the diagnosis of history of colon polyps.  The various methods of treatment have been discussed with the patient and family. After consideration of risks, benefits and other options for treatment, the patient has consented to  Procedure(s) with comments: COLONOSCOPY (N/A) - 11:00am, ASA 1-2 as a surgical intervention.  The patient's history has been reviewed, patient examined, no change in status, stable for surgery.  I have reviewed the patient's chart and labs.  Questions were answered to the patient's satisfaction.     Mathilda Maguire Castaneda Mayorga

## 2024-08-26 NOTE — Op Note (Addendum)
 [facility] Patient Name: Shannon Lin Procedure Date: 08/26/2024 9:50 AM MRN: [id number] Date of Birth: 04-19-1991 Attending MD: Toribio Fortune , , [id number] CSN: [id number] Age: 33 Admit Type: Outpatient Procedure:                Flexible Sigmoidoscopy Indications:              Surveillance: Personal history of adenomatous                            polyps on last colonoscopy 5 years ago Providers:                Toribio Fortune, Olam Ada, RN, Dorcas Lenis,                            Technician Referring MD:              Medicines:                Monitored Anesthesia Care Complications:            No immediate complications. Estimated Blood Loss:     Estimated blood loss: none. Procedure:                Pre-Anesthesia Assessment:                           - Prior to the procedure, a History and Physical                            was performed, and patient medications, allergies                            and sensitivities were reviewed. The patient's                            tolerance of previous anesthesia was reviewed.                           - The risks and benefits of the procedure and the                            sedation options and risks were discussed with the                            patient. All questions were answered and informed                            consent was obtained.                           - ASA Grade Assessment: II - A patient with mild                            systemic disease.                           After obtaining informed consent, the scope was  passed under direct vision. The [id number]-HQ190L                            ([id number]) Peds Colon was introduced through the                            anus and advanced to the the descending colon.                            After obtaining informed consent, the scope was                            passed under direct vision.The colonoscopy was                             performed without difficulty. The patient tolerated                            the procedure well. The quality of the bowel                            preparation was poor. Scope In: 10:03:51 AM Scope Out: 10:09:38 AM Total Procedure Duration: 0 hours 5 minutes 47 seconds  Findings:      The perianal and digital rectal examinations were normal.      A large amount of semi-solid stool was found in the rectum, in the       sigmoid colon and in the descending colon, interfering with       visualization.      Retroflexion did not show any large anal lesions, although examination       was limited by presence of stool. Impression:               - Preparation of the colon was poor.                           - Stool in the rectum, in the sigmoid colon and in                            the descending colon.                           - No specimens collected. Moderate Sedation:      Per Anesthesia Care Recommendation:           - Discharge patient to home (ambulatory).                           - Resume previous diet.                           - Repeat colonoscopy at the next available                            appointment because the bowel preparation was poor.  Will need a long prep with an extra dose of Linzess. Procedure Code(s):        --- Professional ---                           H9895, Colorectal cancer screening; flexible                            sigmoidoscopy Diagnosis Code(s):        --- Professional ---                           Z86.010, Personal history of colonic polyps CPT copyright 2022 American Medical Association. All rights reserved. The codes documented in this report are preliminary and upon coder review may  be revised to meet current compliance requirements. Toribio Fortune, MD Toribio Fortune,  08/26/2024 10:25:03 AM This report has been signed electronically. Number of Addenda: 0

## 2024-08-26 NOTE — Transfer of Care (Signed)
 Immediate Anesthesia Transfer of Care Note  Patient: Shannon Lin  Procedure(s) Performed: KINGSTON SIDE  Patient Location: Endoscopy Unit  Anesthesia Type:General  Level of Consciousness: awake, alert , and oriented  Airway & Oxygen Therapy: Patient Spontanous Breathing  Post-op Assessment: Report given to RN, Post -op Vital signs reviewed and stable, Patient moving all extremities X 4, and Patient able to stick tongue midline  Post vital signs: Reviewed and stable  Last Vitals:  Vitals Value Taken Time  BP 111/64 08/26/24 10:15  Temp 37 C 08/26/24 10:15  Pulse 87 08/26/24 10:15  Resp 17 08/26/24 10:15  SpO2 96 % 08/26/24 10:15    Last Pain:  Vitals:   08/26/24 1015  TempSrc: Oral  PainSc: 0-No pain      Patients Stated Pain Goal: 7 (08/26/24 0946)  Complications: No notable events documented.

## 2024-08-26 NOTE — Telephone Encounter (Signed)
 Per Dr. Eartha Lob, please reschedule her in next available , had poor prep, any room. diangosis: colon polyps.  Needs Golytely as prep, but please do a long prep with Mag citrate two days before AND please give her one sample of Linzess pill 290 mcg to take the day before colonoscopy.

## 2024-08-29 ENCOUNTER — Encounter (INDEPENDENT_AMBULATORY_CARE_PROVIDER_SITE_OTHER): Payer: Self-pay

## 2024-08-29 LAB — POCT PREGNANCY, URINE: Preg Test, Ur: NEGATIVE

## 2024-08-29 NOTE — Telephone Encounter (Signed)
Called pt, no answer and VM not set up 

## 2024-08-30 ENCOUNTER — Encounter (HOSPITAL_COMMUNITY): Payer: Self-pay | Admitting: Gastroenterology

## 2024-09-04 ENCOUNTER — Encounter: Payer: Self-pay | Admitting: Nurse Practitioner

## 2024-09-04 ENCOUNTER — Encounter: Payer: Self-pay | Admitting: Family Medicine

## 2024-09-05 NOTE — Telephone Encounter (Signed)
 Nurses I am sympathetic to her concerns But it should also be noted that we have done several test  Patient has had ultrasound of that area and the CAT scan neither 1 showed a growth. Some individuals can have asymmetry causing 1 side to push out more than the other side In other words we are not always exactly equal on each side I would recommend a follow-up office visit to discuss  In regards to a second opinion we could discuss this at an office visit and discuss options Thanks-Dr. Glendia

## 2024-09-07 ENCOUNTER — Ambulatory Visit: Payer: Self-pay

## 2024-09-07 NOTE — Telephone Encounter (Signed)
 FYI Only or Action Required?: appointment  Patient was last seen in primary care on 07/29/2024 by Bevely Doffing, FNP.  Called Nurse Triage reporting Swelling at ribs.  Symptoms began several months ago.  Interventions attempted: Nothing.  Symptoms are: unchanged.Swelling, mild pain to left ribs. No injury. States she will call back to make an appointment due to change of snow this week.  Triage Disposition: See PCP When Office is Open (Within 3 Days)  Patient/caregiver understands and will follow disposition?: Yes     Copied from CRM #[id number]. Topic: Clinical - Red Word Triage >> Sep 07, 2024  3:33 PM Antwanette L wrote: Red Word that prompted transfer to Nurse Triage: Patient reports right and left rib pain with associated swelling. Patient had a CT scan performed on 11/21 at [facility]. Patient is requesting a second opinion. Answer Assessment - Initial Assessment Questions 1. LOCATION: Where does it hurt?       Left ribs swollen 2. RADIATION: Does the pain go anywhere else? (e.g., into neck, jaw, arms, back)     no 3. ONSET: When did the chest pain begin? (Minutes, hours or days)      August 4. PATTERN: Does the pain come and go, or has it been constant since it started?  Does it get worse with exertion?      Comes and goes 5. DURATION: How long does it last (e.g., seconds, minutes, hours)     varies 6. SEVERITY: How bad is the pain?  (e.g., Scale 1-10; mild, moderate, or severe)     2 7. CARDIAC RISK FACTORS: Do you have any history of heart problems or risk factors for heart disease? (e.g., angina, prior heart attack; diabetes, high blood pressure, high cholesterol, smoker, or strong family history of heart disease)     no 8. PULMONARY RISK FACTORS: Do you have any history of lung disease?  (e.g., blood clots in lung, asthma, emphysema, birth control pills)     no 9. CAUSE: What do you think is causing the chest pain?     unsure 10. OTHER SYMPTOMS:  Do you have any other symptoms? (e.g., dizziness, nausea, vomiting, sweating, fever, difficulty breathing, cough)       no 11. PREGNANCY: Is there any chance you are pregnant? When was your last menstrual period?       no  Protocols used: Chest Pain-A-AH  Reason for Disposition  [1] Chest pain(s) lasting a few seconds AND [2] persists > 3 days  Answer Assessment - Initial Assessment Questions 1. LOCATION: Where does it hurt?       Left ribs swollen 2. RADIATION: Does the pain go anywhere else? (e.g., into neck, jaw, arms, back)     no 3. ONSET: When did the chest pain begin? (Minutes, hours or days)      August 4. PATTERN: Does the pain come and go, or has it been constant since it started?  Does it get worse with exertion?      Comes and goes 5. DURATION: How long does it last (e.g., seconds, minutes, hours)     varies 6. SEVERITY: How bad is the pain?  (e.g., Scale 1-10; mild, moderate, or severe)     2 7. CARDIAC RISK FACTORS: Do you have any history of heart problems or risk factors for heart disease? (e.g., angina, prior heart attack; diabetes, high blood pressure, high cholesterol, smoker, or strong family history of heart disease)     no 8. PULMONARY RISK FACTORS: Do you  have any history of lung disease?  (e.g., blood clots in lung, asthma, emphysema, birth control pills)     no 9. CAUSE: What do you think is causing the chest pain?     unsure 10. OTHER SYMPTOMS: Do you have any other symptoms? (e.g., dizziness, nausea, vomiting, sweating, fever, difficulty breathing, cough)       no 11. PREGNANCY: Is there any chance you are pregnant? When was your last menstrual period?       no  Protocols used: Chest Pain-A-AH

## 2024-09-08 NOTE — Telephone Encounter (Signed)
 See duplicate message- Dr Glendia recommends follow up office visit to discuss

## 2024-09-12 NOTE — Telephone Encounter (Signed)
 See duplicate- patient advised to contact office to schedule a follow up office visit

## 2024-09-17 ENCOUNTER — Encounter: Payer: Self-pay | Admitting: Family Medicine

## 2024-09-21 ENCOUNTER — Other Ambulatory Visit: Payer: Self-pay | Admitting: Nurse Practitioner

## 2024-09-21 MED ORDER — VALACYCLOVIR HCL 1 G PO TABS: ORAL_TABLET | ORAL | 2 refills | Status: DC

## 2024-09-27 ENCOUNTER — Encounter: Payer: Self-pay | Admitting: Obstetrics and Gynecology

## 2024-09-27 ENCOUNTER — Ambulatory Visit (INDEPENDENT_AMBULATORY_CARE_PROVIDER_SITE_OTHER): Admitting: Obstetrics and Gynecology

## 2024-09-27 VITALS — BP 123/81 | HR 87 | Ht 61.0 in | Wt 202.5 lb

## 2024-09-27 DIAGNOSIS — Z3046 Encounter for surveillance of implantable subdermal contraceptive: Secondary | ICD-10-CM | POA: Diagnosis not present

## 2024-09-27 DIAGNOSIS — Z3202 Encounter for pregnancy test, result negative: Secondary | ICD-10-CM | POA: Diagnosis not present

## 2024-09-27 DIAGNOSIS — Z30017 Encounter for initial prescription of implantable subdermal contraceptive: Secondary | ICD-10-CM | POA: Insufficient documentation

## 2024-09-27 LAB — POCT URINE PREGNANCY: Preg Test, Ur: NEGATIVE

## 2024-09-27 MED ORDER — ETONOGESTREL 68 MG ~~LOC~~ IMPL
68.0000 mg | DRUG_IMPLANT | Freq: Once | SUBCUTANEOUS | Status: AC
  Administered 2024-09-27: 68 mg via SUBCUTANEOUS

## 2024-09-27 NOTE — Progress Notes (Signed)
" ° ° ° °  GYNECOLOGY OFFICE PROCEDURE NOTE  KIKUYE KORENEK is a 33 y.o. G0P0000 here for Nexplanon  removal and Nexplanon  insertion.  Last pap smear was on 10/02/21 and was normal.    Nexplanon  Removal and Reinsertion Patient identified, informed consent performed, consent signed.   Patient does understand that irregular bleeding is a very common side effect of this medication. She was advised to have backup contraception for one week after replacement of the implant.   Pregnancy test in clinic today was negative.  Appropriate time out taken. Nexplanon  site identified in right arm.  Area prepped in usual sterile fashon. Three ml of 1% lidocaine  was used to anesthetize the area at the implant. A small stab incision was made right beside the implant on the distal portion. The Nexplanon  rod was grasped using hemostats and removed without difficulty. There was minimal blood loss. Nexplanon  removed from packaging, Device confirmed in needle, then inserted full length of needle and withdrawn per handbook instructions. Nexplanon  was able to palpated in the patient's arm; patient palpated the insert herself.  There was minimal blood loss. Patient insertion site covered with gauze and a pressure bandage to reduce any bruising.   The patient tolerated the procedure well and was given post procedure instructions.  She was advised to have backup contraception for one week.     Will return for Annual exam with pap    Nidia Daring, FNP [facility]     "

## 2024-10-07 ENCOUNTER — Encounter: Payer: Self-pay | Admitting: Family Medicine

## 2024-10-10 ENCOUNTER — Encounter: Payer: Self-pay | Admitting: Podiatry

## 2024-10-10 ENCOUNTER — Ambulatory Visit (INDEPENDENT_AMBULATORY_CARE_PROVIDER_SITE_OTHER): Admitting: Podiatry

## 2024-10-10 VITALS — Ht 61.0 in | Wt 202.5 lb

## 2024-10-10 DIAGNOSIS — D2372 Other benign neoplasm of skin of left lower limb, including hip: Secondary | ICD-10-CM | POA: Diagnosis not present

## 2024-10-10 DIAGNOSIS — M216X2 Other acquired deformities of left foot: Secondary | ICD-10-CM | POA: Diagnosis not present

## 2024-10-10 DIAGNOSIS — M216X1 Other acquired deformities of right foot: Secondary | ICD-10-CM
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
## 2024-10-11 ENCOUNTER — Telehealth: Payer: Self-pay | Admitting: Podiatry

## 2024-10-11 NOTE — Telephone Encounter (Signed)
 Patient called in stating that she would like to proceed with surgery instead of custom orthotics.

## 2024-10-15 ENCOUNTER — Encounter: Payer: Self-pay | Admitting: Family Medicine

## 2024-10-17 ENCOUNTER — Ambulatory Visit (HOSPITAL_COMMUNITY): Payer: MEDICAID | Admitting: Registered Nurse

## 2024-10-17 ENCOUNTER — Encounter (HOSPITAL_COMMUNITY): Payer: Self-pay | Admitting: Registered Nurse

## 2024-10-17 VITALS — BP 109/71 | HR 80 | Ht 61.0 in | Wt 203.6 lb

## 2024-10-17 DIAGNOSIS — F3131 Bipolar disorder, current episode depressed, mild: Secondary | ICD-10-CM

## 2024-10-17 DIAGNOSIS — G4700 Insomnia, unspecified: Secondary | ICD-10-CM

## 2024-10-17 DIAGNOSIS — Z79899 Other long term (current) drug therapy: Secondary | ICD-10-CM | POA: Diagnosis not present

## 2024-10-17 MED ORDER — CARIPRAZINE HCL 1.5 MG PO CAPS: 1.5000 mg | ORAL_CAPSULE | Freq: Every day | ORAL | 1 refills | Status: AC

## 2024-10-17 MED ORDER — MELATONIN 5 MG PO CHEW: 5.0000 mg | CHEWABLE_TABLET | Freq: Every evening | ORAL | Status: DC | PRN

## 2024-10-17 NOTE — Patient Instructions (Signed)

## 2024-10-17 NOTE — Telephone Encounter (Signed)
 Nurses-please work with patient to clarify what problems he is having and what she is desiring a second opinion on-I am just somewhat not aware of the exact concern she has thank you  We are more than willing to help thank you

## 2024-10-17 NOTE — Telephone Encounter (Signed)
 May have referral to plastic surgery for second opinion but I would highly recommend that she do a follow-up visit with her current surgeon before seeking a second opinion
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
## 2024-10-19 LAB — TSH: TSH: 1.54 m[IU]/L

## 2024-10-21 ENCOUNTER — Telehealth: Payer: Self-pay | Admitting: Podiatry

## 2024-10-21 NOTE — Telephone Encounter (Signed)
 Ortho in [place], lvm for patient to call and schedule an appointment

## 2024-10-24 ENCOUNTER — Ambulatory Visit: Admitting: Podiatry

## 2024-10-24 ENCOUNTER — Encounter: Payer: Self-pay | Admitting: Podiatry

## 2024-10-24 ENCOUNTER — Ambulatory Visit (INDEPENDENT_AMBULATORY_CARE_PROVIDER_SITE_OTHER)

## 2024-10-24 VITALS — Ht 61.0 in | Wt 203.6 lb

## 2024-10-24 DIAGNOSIS — M216X1 Other acquired deformities of right foot: Secondary | ICD-10-CM

## 2024-10-24 DIAGNOSIS — M2011 Hallux valgus (acquired), right foot: Secondary | ICD-10-CM | POA: Diagnosis not present

## 2024-10-24 NOTE — Patient Instructions (Signed)
 Preparing for Surgery      Thank you for choosing [facility] for your surgical care. Our board-certified and board-qualified physicians bring advanced training and a deep commitment to the highest standards in foot and ankle surgery.   From your first consultation to your final steps in recovery, our team is here to ensure you feel informed, supported, and confident throughout the entire process.   Visit [email or web address] to access our Surgery Patient page, where youll find all of the information listed below, plus additional helpful resources.      The Time of Your Surgery   For hospital procedures, your surgery time will be provided at the time of scheduling. If youre scheduled at [facility], youll receive a call from the surgical center 24 hours before your procedure with your confirmed time.?Please refer to the surgery information provided to you during your surgical consultation to determine where your surgery will take place.  Recommended Devices for Easier Recovery  Devices such as a knee scooter, crutches, walker, or wheelchair may or may not be covered by your insurance. If your surgeon has recommended this after surgery and it is not covered by your insurance you are still responsible for obtaining and using the recommended equipment. If you have questions or concerns about what equipment you will need please contact the office.  To support a smoother recovery, weve gathered a list of helpful, recommended equipment. Visit [email or web address] to see the full list.       Taking Medications?   If you are taking daily heart and blood pressure medications, seizure, reflux, allergy, asthma, anxiety, pain, or diabetes medications, make sure you notify the surgery center/hospital before the day of surgery so they can tell you which medications you should take or avoid the day of surgery.    GLP-1 antagonists taken weekly  should be stopped for 7 days before surgery. GLP-1 antagonists taken daily be stopped on the day of surgery. These include:   Dulaglutide (Trulicity)   Exenatide extended release (Bydureon BCise)   Exenatide (Byetta)   Semaglutide (Ozempic; Birch.brandt)   Tirzepatide (Mounjaro)   Liraglutide (Victoza, Saxenda)   Lixisenatide (Adlyxin)   Phentermine (Adipex-P, Lomaira)   Semaglutide (Rybelsus) is oral and should be held for 3 days.   Metformin - should be held for 2 days prior to surgery.   SGLT2 inhibitors should be stopped for 3 days (no longer because of the risk of euglycemic diabetic ketoacidosis) and include:   Canagliflozin (Invokana)   Ertugliflozin (Steglatro)   Dapagliflozin (Farxiga)   Empagliflozin (Jardiance)   Additional medications to stop:   If you take blood thinners (Warfarin, Eliquis, Xarelto), please consult your doctor about stopping before your procedure.   Aspirin: Consult your doctor about stopping 1 week before your procedure.   Anti-inflammatory medications (such as ibuprofen)        Pre-Operative Instructions   Plan to be at the hospital at least an hour and a half (1.5), or at the surgery center (1) hour before your scheduled time, unless otherwise directed by the surgical center/hospital staff. You must have a responsible adult accompany you, remain during the surgery, and drive you home. Make sure you have directions to the surgical center/hospital to ensure you arrive on time.       [facility] [place]             [address]    [address]  [place], [place] [address]    [place], [place] [address]                 [phone]      [facility]   [facility] [place]         [address]                [address]          [place], [place] [address]                            [place], [place] [address]     [facility]         [address]          [place], [place] [address]      If you are having surgery at [facility] or [facility], you will need a copy of your medical history and physical form from your family physician within one month before the date of surgery. We will give you a form for your primary physician to complete.       We will make every effort to accommodate the date you request for surgery. However, there are times when surgery dates or times need to be moved. We will contact you as soon as possible if a schedule change is required.     No aspirin/ibuprofen for one week before surgery. If you are on aspirin, any non-steroidal anti-inflammatory medications (Mobic, Aleve, Ibuprofen) should not be taken seven (7) days before surgery.       No food or drink after midnight the night before surgery unless directed otherwise by the surgical center/hospital staff. If you are having a surgical procedure in our office, and not in the surgical center or hospital, this does not apply to you.     No alcoholic beverages 24 hours before surgery. No smoking 24 hours before or 24 hours after surgery.      Wear loose pants or shorts. They should be loose enough to fit over bandages, boots, and casts.     Do not wear slip-on shoes. Sneakers are preferred.      If you were given a boot during your surgery consultation appointment, be sure to bring it with you to the surgery center/hospital on the day of your surgery. Also, bring crutches, a knee scooter, or a walker if your physician prescribed it for you before your surgery date.      If you have not been contacted by the surgery center/hospital by the day before your surgery, call the surgery center/hospital to confirm the date and time of your surgery.     Leave time from work may vary depending on the type of surgery you have. Appropriate arrangements should be made before surgery with your employer.     Prescriptions will be electronically submitted  to your pharmacy the evening before your surgery or the day of your surgery. Take the medication as directed. Pain medications will not be refilled on weekends and must be approved by the doctor.      Remove nail polish on the operative foot and avoid getting a pedicure two weeks before surgery.      The night before surgery, wash the foot and leg that is being operated on with water and the antibacterial soap that was provided at your consultation appointment. The antibacterial soap is encased in the  brush. You will need to wet the brush to cleanse the area. Be sure to pay special attention to beneath the toenails and in between the toes. Wash for at least three (3) minutes. Rinse thoroughly with water and pat dry with a towel. Perform this wash unless told not to do so by your physician.    If you have any questions regarding the scheduling of your surgery, please contact our surgical department at [phone]   If you have any questions regarding any of these instructions, please do not hesitate to call our triage nurse at [phone]
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
## 2024-10-25 ENCOUNTER — Encounter: Payer: Self-pay | Admitting: Nurse Practitioner

## 2024-10-27 ENCOUNTER — Ambulatory Visit: Payer: Self-pay

## 2024-10-27 NOTE — Telephone Encounter (Signed)
 FYI Only or Action Required?: FYI only for provider: appointment scheduled on 10/28/24.  Patient was last seen in primary care on 07/29/2024 by Bevely Doffing, FNP.  Called Nurse Triage reporting Sore Throat.  Symptoms began several weeks ago.  Interventions attempted: Nothing.  Symptoms are: unchanged.  Triage Disposition: See PCP When Office is Open (Within 3 Days)  Patient/caregiver understands and will follow disposition?: Yes  Reason for Disposition  [1] Sore throat with cough/cold symptoms AND [2] present > 5 days  Answer Assessment - Initial Assessment Questions 1. ONSET: When did the throat start hurting? (Hours or days ago)      A month, but worsening, tonsil stones started last week  2. SEVERITY: How bad is the sore throat? (Scale 1-10; mild, moderate or severe)     Feels like needles 6-7/10  4.  VIRAL SYMPTOMS: Are there any symptoms of a cold, such as a runny nose, cough, hoarse voice or red eyes?      Nasal congestion  5. FEVER: Do you have a fever? If Yes, ask: What is your temperature, how was it measured, and when did it start?     Saturday 99.8  6. PUS ON THE TONSILS: Is there pus on the tonsils in the back of your throat?     Tonsils stones coming out  7. OTHER SYMPTOMS: Do you have any other symptoms? (e.g., difficulty breathing, headache, rash)     Denies SOB  Protocols used: Sore Throat-A-AH   Message from [facility] G sent at 10/27/2024  1:42 PM EST  Reason for Triage: throat hurts, tonsil stones are coming out, happening since last week, throat and tongue are swollen, gets choked

## 2024-10-28 ENCOUNTER — Encounter: Payer: Self-pay | Admitting: Physician Assistant

## 2024-10-28 ENCOUNTER — Ambulatory Visit: Admitting: Physician Assistant

## 2024-10-28 VITALS — BP 138/92 | HR 95 | Temp 98.2°F | Ht 61.0 in | Wt 203.0 lb

## 2024-10-28 DIAGNOSIS — J020 Streptococcal pharyngitis: Secondary | ICD-10-CM

## 2024-10-28 LAB — POCT RAPID STREP A (OFFICE): Rapid Strep A Screen: POSITIVE — AB

## 2024-10-28 MED ORDER — AMOXICILLIN-POT CLAVULANATE 875-125 MG PO TABS: 1.0000 | ORAL_TABLET | Freq: Two times a day (BID) | ORAL | 0 refills | Status: DC

## 2024-10-28 MED ORDER — PREDNISONE 20 MG PO TABS: 40.0000 mg | ORAL_TABLET | Freq: Every day | ORAL | 0 refills | Status: AC

## 2024-10-28 NOTE — Progress Notes (Signed)
 "  Acute Office Visit  Subjective:     Patient ID: Shannon Lin, female    DOB: [birth year], 34 y.o.   MRN: [id number]   Discussed the use of AI scribe software for clinical note transcription with the patient, who gave verbal consent to proceed.  History of Present Illness Shannon Lin is a 34 year old female who presents with a sore throat and positive strep throat test.  She has been experiencing a sore throat for the past month, initially presenting with throat pain and tonsil stones. The stones are described as hard, pebble-like, and without odor. Her throat is reportedly swollen, obscuring the back of her throat, and she is concerned her tongue is also swollen. She had a fever of 99.1F two days ago. She is currently taking ibuprofen  for symptom relief, as Tylenol is ineffective. She uses hot showers and cold wash rags to manage symptoms at home. She has difficulty swallowing pills and prefers a liquid form.  She has a history of earaches but has never had strep throat before. She is the only member of her family who still has tonsils.      Review of Systems  Constitutional:  Positive for fever. Negative for activity change, appetite change and fatigue.  HENT:  Positive for sore throat. Negative for congestion, drooling and facial swelling.   Respiratory:  Negative for cough and shortness of breath.   Cardiovascular:  Negative for chest pain.         Objective:     BP (!) 138/92   Pulse 95   Temp 98.2 F (36.8 C)   Ht 5' 1 (1.549 m)   Wt 203 lb (92.1 kg)   SpO2 97%   BMI 38.36 kg/m   Physical Exam Constitutional:      General: She is not in acute distress.    Appearance: Normal appearance. She is obese. She is not ill-appearing.  HENT:     Head: Normocephalic and atraumatic.     Nose: No congestion.     Mouth/Throat:     Mouth: Mucous membranes are moist.     Pharynx: Pharyngeal swelling present. No oropharyngeal exudate.  Eyes:     Extraocular  Movements: Extraocular movements intact.     Conjunctiva/sclera: Conjunctivae normal.  Cardiovascular:     Rate and Rhythm: Normal rate and regular rhythm.     Heart sounds: No murmur heard. Pulmonary:     Effort: Pulmonary effort is normal.     Breath sounds: No rhonchi or rales.  Skin:    General: Skin is warm and dry.  Neurological:     General: No focal deficit present.     Mental Status: She is alert and oriented to person, place, and time.  Psychiatric:        Mood and Affect: Mood normal.        Behavior: Behavior normal.     Results for orders placed or performed in visit on 10/28/24  Rapid Strep A  Result Value Ref Range   Rapid Strep A Screen Positive (A) Negative        Assessment & Plan:  Strep pharyngitis Assessment & Plan: Positive rapid strep: Antibiotics prescribed.  Promote hydration.  Analgesia and fever control with acetaminophen, ibuprofen . Follow up if worsening, fevers persist for > 5 days, severe pain with swallowing or unable to swallow or drooling.    Orders: -     Amoxicillin -Pot Clavulanate; Take 1 tablet by mouth 2 (two) times daily.  Dispense: 20 tablet; Refill: 0 -     predniSONE ; Take 2 tablets (40 mg total) by mouth daily for 3 days.  Dispense: 6 tablet; Refill: 0 -     POCT rapid strep A    Return if symptoms worsen or fail to improve.  Lanette Ell, PA-C  "

## 2024-10-28 NOTE — Assessment & Plan Note (Signed)
 Positive rapid strep: Antibiotics prescribed.  Promote hydration.  Analgesia and fever control with acetaminophen , ibuprofen . Follow up if worsening, fevers persist for > 5 days, severe pain with swallowing or unable to swallow or drooling.

## 2024-10-30 ENCOUNTER — Encounter: Payer: Self-pay | Admitting: Physician Assistant

## 2024-11-01 ENCOUNTER — Telehealth: Payer: Self-pay

## 2024-11-01 ENCOUNTER — Encounter: Payer: Self-pay | Admitting: Nurse Practitioner

## 2024-11-01 ENCOUNTER — Ambulatory Visit: Admitting: Family Medicine

## 2024-11-01 VITALS — BP 124/81 | HR 87 | Temp 97.5°F | Ht 61.0 in | Wt 205.5 lb

## 2024-11-01 DIAGNOSIS — J020 Streptococcal pharyngitis: Secondary | ICD-10-CM | POA: Diagnosis not present

## 2024-11-01 DIAGNOSIS — E119 Type 2 diabetes mellitus without complications: Secondary | ICD-10-CM | POA: Diagnosis not present

## 2024-11-01 DIAGNOSIS — K642 Third degree hemorrhoids: Secondary | ICD-10-CM

## 2024-11-01 DIAGNOSIS — R1084 Generalized abdominal pain: Secondary | ICD-10-CM

## 2024-11-01 DIAGNOSIS — R112 Nausea with vomiting, unspecified: Secondary | ICD-10-CM | POA: Diagnosis not present

## 2024-11-01 DIAGNOSIS — Z7984 Long term (current) use of oral hypoglycemic drugs: Secondary | ICD-10-CM | POA: Diagnosis not present

## 2024-11-01 MED ORDER — AMOXICILLIN 500 MG PO CAPS: 500.0000 mg | ORAL_CAPSULE | Freq: Three times a day (TID) | ORAL | 0 refills | Status: AC

## 2024-11-01 NOTE — Progress Notes (Signed)
" ° °  Subjective:    Patient ID: Shannon Lin, female    DOB: 24-Jul-1991, 33 y.o.   MRN: [id number]  HPI Patient is here from getting diagnosis with strep throat last week. Has been on an amoxicillin   Patient is now having body aches, bruising in her gums, vomiting and diarrhea. Still having difficulty swallowing  She had significant strep throat was placed on Augmentin  and prednisone  but ever since being on the medication she is having nausea diarrhea watery stools as well as intermittent vomiting and abdominal cramps and abdominal discomfort she was not having this before starting on the medications She denies fevers chills sweats hives Patient relates that she is trying to the best she can with dietary measures Gaining weight Tried metformin  in the past seem to help but also caused some side effects   Review of Systems     Objective:   Physical Exam  General-in no acute distress Eyes-no discharge Lungs-respiratory rate normal, CTA CV-no murmurs,RRR Extremities skin warm dry no edema Neuro grossly normal Behavior normal, alert Abd soft Throat not swollen      Assessment & Plan:  I suspect a lot of this is drug side effects.  I recommend that she stop the prednisone  throat appears much more normal currently we will switch away from Augmentin  to plain amoxicillin  3 times daily for 7 days she is not allergic to these medicine she which is having side effects Her abdominal exam soft no guarding or rebound has subjective discomfort in the lower abdomen this should get better over the next 3 to 4 days if not she is to let us  know I would not recommend CAT scan at this point She will do lab work  She also has type 2 diabetes.  Was on metformin  seemingly did okay for her but she had a lot of problems with nausea and diarrhea with the medicine  She would be a good candidate for GLP-1-we did discuss that medication and potential side effects she is interested if possible-she does  relate that metformin  in the past caused her to have nausea and diarrhea and she does not want to take it again  Labs came back with elevated A1c reasonable to start Ozempic  patient is aware of the potential side effects no family history of medullary cancer of the thyroid  start off at 0.25 once a week for the first 4 weeks then 0.5 once a week thereafter recommend a follow-up office visit in several months "

## 2024-11-01 NOTE — Progress Notes (Signed)
 Complex Care Management Note  Care Guide Note 11/01/2024 Name: Shannon Lin MRN: [id number] DOB: [birth year]  Shannon Lin is a 34 y.o. year old female who sees Luking, Glendia LABOR, MD for primary care. I reached out to [employer] by phone today to offer complex care management services.  Shannon Lin was given information about Complex Care Management services today including:   The Complex Care Management services include support from the care team which includes your Nurse Care Manager, Clinical Social Worker, or Pharmacist.  The Complex Care Management team is here to help remove barriers to the health concerns and goals most important to you. Complex Care Management services are voluntary, and the patient may decline or stop services at any time by request to their care team member.   Complex Care Management Consent Status: Patient agreed to services and verbal consent obtained.   Follow up plan:  Telephone appointment with complex care management team member scheduled for:  11/11/24 at 9:00 a.m.   Encounter Outcome:  Patient Scheduled  D[facility]  [facility] [address], [facility] VBCI Assistant Direct Dial: [phone]  Fax: [phone]

## 2024-11-02 ENCOUNTER — Telehealth: Payer: Self-pay

## 2024-11-02 ENCOUNTER — Encounter: Payer: Self-pay | Admitting: Family Medicine

## 2024-11-02 ENCOUNTER — Ambulatory Visit: Payer: Self-pay | Admitting: Family Medicine

## 2024-11-02 ENCOUNTER — Telehealth: Payer: Self-pay | Admitting: Podiatry

## 2024-11-02 ENCOUNTER — Telehealth: Payer: Self-pay | Admitting: Family Medicine

## 2024-11-02 ENCOUNTER — Other Ambulatory Visit: Payer: Self-pay | Admitting: Family Medicine

## 2024-11-02 LAB — CBC WITH DIFFERENTIAL/PLATELET
Basophils Absolute: 0 10*3/uL (ref 0.0–0.2)
Basos: 0 %
EOS (ABSOLUTE): 0 10*3/uL (ref 0.0–0.4)
Eos: 0 %
Hematocrit: 40.5 % (ref 34.0–46.6)
Hemoglobin: 13.4 g/dL (ref 11.1–15.9)
Immature Grans (Abs): 0.2 10*3/uL — ABNORMAL HIGH (ref 0.0–0.1)
Immature Granulocytes: 1 %
Lymphocytes Absolute: 2.2 10*3/uL (ref 0.7–3.1)
Lymphs: 16 %
MCH: 29.4 pg (ref 26.6–33.0)
MCHC: 33.1 g/dL (ref 31.5–35.7)
MCV: 89 fL (ref 79–97)
Monocytes Absolute: 0.6 10*3/uL (ref 0.1–0.9)
Monocytes: 4 %
Neutrophils Absolute: 10.7 10*3/uL — ABNORMAL HIGH (ref 1.4–7.0)
Neutrophils: 79 %
Platelets: 362 10*3/uL (ref 150–450)
RBC: 4.56 x10E6/uL (ref 3.77–5.28)
RDW: 13.3 % (ref 11.7–15.4)
WBC: 13.7 10*3/uL — ABNORMAL HIGH (ref 3.4–10.8)

## 2024-11-02 LAB — BASIC METABOLIC PANEL WITH GFR
BUN/Creatinine Ratio: 10 (ref 9–23)
BUN: 6 mg/dL (ref 6–20)
CO2: 23 mmol/L (ref 20–29)
Calcium: 9.7 mg/dL (ref 8.7–10.2)
Chloride: 100 mmol/L (ref 96–106)
Creatinine, Ser: 0.6 mg/dL (ref 0.57–1.00)
Glucose: 169 mg/dL — ABNORMAL HIGH (ref 70–99)
Potassium: 4.3 mmol/L (ref 3.5–5.2)
Sodium: 139 mmol/L (ref 134–144)
eGFR: 121 mL/min/{1.73_m2}

## 2024-11-02 LAB — HEMOGLOBIN A1C
Est. average glucose Bld gHb Est-mCnc: 151 mg/dL
Hgb A1c MFr Bld: 6.9 % — ABNORMAL HIGH (ref 4.8–5.6)

## 2024-11-02 LAB — HEPATIC FUNCTION PANEL
ALT: 20 [IU]/L (ref 0–32)
AST: 15 [IU]/L (ref 0–40)
Albumin: 4.5 g/dL (ref 3.9–4.9)
Alkaline Phosphatase: 107 [IU]/L (ref 41–116)
Bilirubin Total: 0.6 mg/dL (ref 0.0–1.2)
Bilirubin, Direct: 0.16 mg/dL (ref 0.00–0.40)
Total Protein: 6.9 g/dL (ref 6.0–8.5)

## 2024-11-02 MED ORDER — OZEMPIC (0.25 OR 0.5 MG/DOSE) 2 MG/3ML ~~LOC~~ SOPN: PEN_INJECTOR | SUBCUTANEOUS | 2 refills | Status: AC

## 2024-11-02 NOTE — Telephone Encounter (Signed)
 Copied from CRM [id number]. Topic: Clinical - Lab/Test Results >> Nov 02, 2024 10:20 AM Travis F wrote: Reason for CRM: Patient is calling in because she received a message from Dr. Alphonsa. Patient says her family doesn't have a history of Thyroid  Medullary Cancer that she knows of. She is requesting Dr. Alphonsa give her a call today as soon as possible to discuss this. She has some concerns.

## 2024-11-02 NOTE — Telephone Encounter (Signed)
 Nurses-I would request that you call and talk with the patient to find out what concerns that she has  She does have some options for her diabetes Option #1 would be utilizing metformin  but metformin  for some people can cause diarrhea is a good medication safe medication  Option #2 we can try to get GLP-1 approved but sometimes insurance companies will require that they try metformin  first GLP-1's are medications such as Mounjaro, Ozempic , Trulicity

## 2024-11-02 NOTE — Telephone Encounter (Signed)
 Recommend follow-up office visit in 4 months with myself or Elveria or Damien for the diabetes We are trying to get her Ozempic  approved it could take a few days If she does not hear anything from the pharmacy for us  in the course of the next 10 to 14 days regarding Ozempic  please message us 
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
## 2024-11-03 ENCOUNTER — Telehealth: Payer: Self-pay

## 2024-11-03 ENCOUNTER — Other Ambulatory Visit (HOSPITAL_COMMUNITY): Payer: Self-pay

## 2024-11-03 ENCOUNTER — Other Ambulatory Visit: Payer: Self-pay | Admitting: Family Medicine

## 2024-11-03 ENCOUNTER — Encounter: Payer: Self-pay | Admitting: Family Medicine

## 2024-11-03 ENCOUNTER — Telehealth: Payer: Self-pay | Admitting: Pharmacy Technician

## 2024-11-03 DIAGNOSIS — D72829 Elevated white blood cell count, unspecified: Secondary | ICD-10-CM

## 2024-11-03 NOTE — Telephone Encounter (Signed)
 With insurance changes often insurance companies have their own rules  Please refer this to the pharmacy approval team and let them work through this issue thank you Send patient a message that we are trying but unfortunately medicine is at the mercy of insurance companies in regards to their rules

## 2024-11-03 NOTE — Telephone Encounter (Signed)
 Pharmacy Patient Advocate Encounter   Received notification from [facility] KEY that prior authorization for Ozempic  0.25mg  is required/requested.   Insurance verification completed.   The patient is insured through [facility] MEDICAID.   Per test claim: PA required; PA submitted to above mentioned insurance via Latent Key/confirmation #/EOC [id number] Status is pending

## 2024-11-03 NOTE — Telephone Encounter (Signed)
 Noted-thank you-Ozempic  has been placed has to go through necessary prior approval steps

## 2024-11-03 NOTE — Telephone Encounter (Unsigned)
 Copied from CRM #[id number]. Topic: Clinical - Prescription Issue >> Nov 03, 2024 10:01 AM Mia F wrote: Reason for CRM:  Pt says that she was approved for Ozempic  yesterday but now she is being told that her insurance has changed and it is no longer covered. Pt is asking if this could be looked into.  [phone]

## 2024-11-03 NOTE — Telephone Encounter (Signed)
See PA notes 

## 2024-11-03 NOTE — Telephone Encounter (Signed)
 Good morning , please see message below  >> Nov 03, 2024 10:01 AM Mia F wrote: Reason for CRM:  Pt says that she was approved for Ozempic  yesterday but now she is being told that her insurance has changed and it is no longer covered. Pt is asking if this could be looked into.  [phone]

## 2024-11-04 ENCOUNTER — Other Ambulatory Visit (HOSPITAL_COMMUNITY): Payer: Self-pay

## 2024-11-04 NOTE — Telephone Encounter (Signed)
 Patient returned call to inquiry about paperwork completion and has been made aware of provider notations.

## 2024-11-04 NOTE — Telephone Encounter (Signed)
 Nurses It appears that the Ozempic  is in a prior approval stage  Please make sure pharmacy team is aware of the situation and working on this, then also inform the patient as well  Patient will have to realize that this process can take multiple days and is controlled by her insurance company not us -that being said we will certainly do our part to make sure we try to get this completed and submitted but once again this whole process can take multiple days due to insurance company rules

## 2024-11-04 NOTE — Telephone Encounter (Signed)
 Called patient and informed per message below It has been approved by [facility]. I confirmed with [facility] that it did process for $4.00. They advised to have patient call before proceeding to pick it up as they may have to order it so it would possibly be Monday before it is ready

## 2024-11-07 ENCOUNTER — Other Ambulatory Visit (HOSPITAL_COMMUNITY): Payer: Self-pay

## 2024-11-07 NOTE — Telephone Encounter (Signed)
 Pharmacy Patient Advocate Encounter  Received notification from [facility] that Prior Authorization for Ozempic  0.25mg  has been APPROVED from 11/03/24 to 11/03/25 - filled 11/04/24   PA #/Case ID/Reference #: [id number]

## 2024-11-08 ENCOUNTER — Telehealth: Payer: Self-pay | Admitting: Podiatry

## 2024-11-10 ENCOUNTER — Other Ambulatory Visit: Payer: Self-pay

## 2024-11-10 ENCOUNTER — Telehealth: Payer: Self-pay | Admitting: Podiatry

## 2024-11-10 DIAGNOSIS — M2011 Hallux valgus (acquired), right foot: Secondary | ICD-10-CM

## 2024-11-10 DIAGNOSIS — M216X1 Other acquired deformities of right foot: Secondary | ICD-10-CM

## 2024-11-10 NOTE — Telephone Encounter (Signed)
 Patient called and stated she needs a order for a knee scooter sent to [facility] in [place] Fax # is [phone]

## 2024-11-11 ENCOUNTER — Other Ambulatory Visit: Payer: Self-pay | Admitting: *Deleted

## 2024-11-11 DIAGNOSIS — Z558 Other problems related to education and literacy: Secondary | ICD-10-CM
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
## 2024-11-11 NOTE — Patient Instructions (Addendum)
 Visit Information  Ms. Acoff was given information about Medicaid Managed Care team care coordination services as a part of their [facility] Community Plan Medicaid benefit.   If you would like to schedule transportation through your [facility], please call the following number at least 2 days in advance of your appointment: [phone]   Rides for urgent appointments can also be made after hours by calling Member Services.  Call the Behavioral Health Crisis Line at [phone], at any time, 24 hours a day, 7 days a week. If you are in danger or need immediate medical attention call 911.  Please see education materials related to Diabetes provided by MyChart link.  Patient verbalizes understanding of instructions and care plan provided today and agrees to view in MyChart. Active MyChart status and patient understanding of how to access instructions and care plan via MyChart confirmed with patient.     Telephone follow up appointment with Managed Medicaid care management team member scheduled for:11-25-2024  Rosina Forte, BSN RN [facility]  [facility], [facility] RN Care Manager Direct Dial: [phone]  Fax: [phone]   Following is a copy of your plan of care:   Goals Addressed             This Visit's Progress    VBCI RN Care Plan - DM       Problems:  Chronic Disease Management support and education needs related to DMII  Goal: Over the next 90 days the Patient will attend all scheduled medical appointments: with primary care provider and specialist as evidenced by keeping all scheduled appointments        continue to work with RN Care Manager and/or Social Worker to address care management and care coordination needs related to DMII as evidenced by adherence to care management team scheduled appointments     take all medications exactly as prescribed and will call provider for medication related questions as  evidenced by compliance with all medications    verbalize basic understanding of DMII disease process and self health management plan as evidenced by verbal explanation, recognizing/monitoring symptoms, lifestyle modifications  Interventions:   Diabetes Interventions: Assessed patient's understanding of A1c goal: <6.5% Provided education to patient about basic DM disease process Reviewed medications with patient and discussed importance of medication adherence Counseled on importance of regular laboratory monitoring as prescribed Discussed plans with patient for ongoing care management follow up and provided patient with direct contact information for care management team Provided patient with written educational materials related to hypo and hyperglycemia and importance of correct treatment Reviewed scheduled/upcoming provider appointments including: 02-28-2025 with PCP Advised patient, providing education and rationale, to check cbg before meals and at bedtime and record, calling provider for findings outside established parameters Referral made to community resources care guide team for assistance with locating in network dental and vision providers Review of patient status, including review of consultants reports, relevant laboratory and other test results, and medications completed Screening for signs and symptoms of depression related to chronic disease state  Assessed social determinant of health barriers Lab Results  Component Value Date   HGBA1C 6.9 (H) 11/01/2024    Patient Self-Care Activities:  Attend all scheduled provider appointments Call provider office for new concerns or questions  Take medications as prescribed   schedule appointment with eye doctor check blood sugar at prescribed times: before meals and at bedtime check feet daily for cuts, sores or redness take the blood sugar log to all  doctor visits drink 6 to 8 glasses of water  each day fill half of plate with  vegetables join a weight loss program keep a food diary limit fast food meals to no more than 1 per week manage portion size prepare main meal at home 3 to 5 days each week set a realistic goal switch to low-fat or skim milk switch to sugar-free drinks wash and dry feet carefully every day wear comfortable, cotton socks  Plan:  Telephone follow up appointment with care management team member scheduled for:  11-25-2024 at 9:30 am

## 2024-11-23 ENCOUNTER — Encounter: Admitting: Podiatry

## 2024-11-25 ENCOUNTER — Telehealth: Admitting: *Deleted

## 2024-11-28 ENCOUNTER — Ambulatory Visit: Admitting: Adult Health

## 2024-12-05 ENCOUNTER — Ambulatory Visit (HOSPITAL_COMMUNITY): Payer: MEDICAID | Admitting: Registered Nurse

## 2024-12-07 ENCOUNTER — Encounter: Admitting: Podiatry

## 2024-12-21 ENCOUNTER — Encounter: Admitting: Podiatry

## 2025-01-16 ENCOUNTER — Ambulatory Visit (HOSPITAL_COMMUNITY): Admitting: Registered Nurse

## 2025-02-28 ENCOUNTER — Ambulatory Visit: Admitting: Nurse Practitioner
